# Patient Record
Sex: Female | Born: 1935 | Race: White | Hispanic: No | Smoking: Never smoker
Health system: Southern US, Community
[De-identification: ages and names within clinical notes are randomized; demographics above are authoritative.]

## PROBLEM LIST (undated history)

## (undated) DIAGNOSIS — I471 Supraventricular tachycardia, unspecified: Secondary | ICD-10-CM

## (undated) DIAGNOSIS — N183 Chronic kidney disease, stage 3 unspecified: Secondary | ICD-10-CM

## (undated) DIAGNOSIS — I10 Essential (primary) hypertension: Secondary | ICD-10-CM

## (undated) DIAGNOSIS — E079 Disorder of thyroid, unspecified: Secondary | ICD-10-CM

## (undated) DIAGNOSIS — I2510 Atherosclerotic heart disease of native coronary artery without angina pectoris: Secondary | ICD-10-CM

## (undated) DIAGNOSIS — D649 Anemia, unspecified: Secondary | ICD-10-CM

## (undated) DIAGNOSIS — K439 Ventral hernia without obstruction or gangrene: Secondary | ICD-10-CM

## (undated) DIAGNOSIS — E785 Hyperlipidemia, unspecified: Secondary | ICD-10-CM

## (undated) DIAGNOSIS — I214 Non-ST elevation (NSTEMI) myocardial infarction: Secondary | ICD-10-CM

## (undated) DIAGNOSIS — E039 Hypothyroidism, unspecified: Secondary | ICD-10-CM

## (undated) DIAGNOSIS — K5790 Diverticulosis of intestine, part unspecified, without perforation or abscess without bleeding: Secondary | ICD-10-CM

## (undated) DIAGNOSIS — C55 Malignant neoplasm of uterus, part unspecified: Secondary | ICD-10-CM

## (undated) HISTORY — DX: Chronic kidney disease, stage 3 (moderate): N18.3

## (undated) HISTORY — DX: Hypothyroidism, unspecified: E03.9

## (undated) HISTORY — DX: Non-ST elevation (NSTEMI) myocardial infarction: I21.4

## (undated) HISTORY — DX: Hyperlipidemia, unspecified: E78.5

## (undated) HISTORY — PX: OTHER SURGICAL HISTORY: SHX169

## (undated) HISTORY — DX: Diverticulosis of intestine, part unspecified, without perforation or abscess without bleeding: K57.90

## (undated) HISTORY — PX: ABDOMINAL HYSTERECTOMY: SHX81

## (undated) HISTORY — DX: Ventral hernia without obstruction or gangrene: K43.9

## (undated) HISTORY — PX: COLON SURGERY: SHX602

## (undated) HISTORY — DX: Essential (primary) hypertension: I10

## (undated) HISTORY — DX: Disorder of thyroid, unspecified: E07.9

## (undated) HISTORY — DX: Supraventricular tachycardia, unspecified: I47.10

## (undated) HISTORY — DX: Malignant neoplasm of uterus, part unspecified: C55

## (undated) HISTORY — DX: Atherosclerotic heart disease of native coronary artery without angina pectoris: I25.10

## (undated) HISTORY — DX: Anemia, unspecified: D64.9

## (undated) HISTORY — DX: Supraventricular tachycardia: I47.1

## (undated) HISTORY — DX: Chronic kidney disease, stage 3 unspecified: N18.30

---

## 1998-03-23 ENCOUNTER — Other Ambulatory Visit: Admission: RE | Admit: 1998-03-23 | Discharge: 1998-03-23 | Payer: Self-pay | Admitting: Obstetrics and Gynecology

## 1999-03-29 ENCOUNTER — Other Ambulatory Visit: Admission: RE | Admit: 1999-03-29 | Discharge: 1999-03-29 | Payer: Self-pay | Admitting: Obstetrics and Gynecology

## 1999-10-07 ENCOUNTER — Encounter: Payer: Self-pay | Admitting: Obstetrics and Gynecology

## 1999-10-07 ENCOUNTER — Encounter: Admission: RE | Admit: 1999-10-07 | Discharge: 1999-10-07 | Payer: Self-pay | Admitting: Obstetrics and Gynecology

## 2000-04-24 ENCOUNTER — Encounter: Admission: RE | Admit: 2000-04-24 | Discharge: 2000-04-24 | Payer: Self-pay | Admitting: Obstetrics and Gynecology

## 2000-04-24 ENCOUNTER — Encounter: Payer: Self-pay | Admitting: Obstetrics and Gynecology

## 2000-05-29 ENCOUNTER — Other Ambulatory Visit: Admission: RE | Admit: 2000-05-29 | Discharge: 2000-05-29 | Payer: Self-pay | Admitting: Obstetrics and Gynecology

## 2001-04-25 ENCOUNTER — Encounter: Payer: Self-pay | Admitting: Obstetrics and Gynecology

## 2001-04-25 ENCOUNTER — Encounter: Admission: RE | Admit: 2001-04-25 | Discharge: 2001-04-25 | Payer: Self-pay | Admitting: Obstetrics and Gynecology

## 2001-06-04 ENCOUNTER — Other Ambulatory Visit: Admission: RE | Admit: 2001-06-04 | Discharge: 2001-06-04 | Payer: Self-pay | Admitting: Obstetrics and Gynecology

## 2002-02-18 ENCOUNTER — Ambulatory Visit (HOSPITAL_COMMUNITY): Admission: RE | Admit: 2002-02-18 | Discharge: 2002-02-19 | Payer: Self-pay | Admitting: Cardiology

## 2007-07-03 ENCOUNTER — Encounter: Admission: RE | Admit: 2007-07-03 | Discharge: 2007-07-03 | Payer: Self-pay | Admitting: Endocrinology

## 2009-08-26 ENCOUNTER — Inpatient Hospital Stay (HOSPITAL_COMMUNITY): Admission: AD | Admit: 2009-08-26 | Discharge: 2009-08-27 | Payer: Self-pay | Admitting: Obstetrics and Gynecology

## 2009-08-26 ENCOUNTER — Ambulatory Visit: Payer: Self-pay | Admitting: Physician Assistant

## 2009-08-26 IMAGING — US US TRANSVAGINAL NON-OB
1 series · 14 of 25 positions shown · non-contrast
Comparison: None.

[DATE] - DUPLICATE COPY for exam association in [facility] – No change from original report.
CLINICAL DATA: Postmenopausal bleeding today. Lower back pain.

 TRANSABDOMINAL AND TRANSVAGINAL ULTRASOUND OF PELVIS
TECHNIQUE: Both transabdominal and transvaginal ultrasound
 examinations of the pelvis were performed including evaluation of
 the uterus, ovaries, adnexal regions, and pelvic cul-de-sac.

[Series 1: us pelvis complete modify · 0.24mm/px · 14 of 46 slices shown]
[im 1/46]
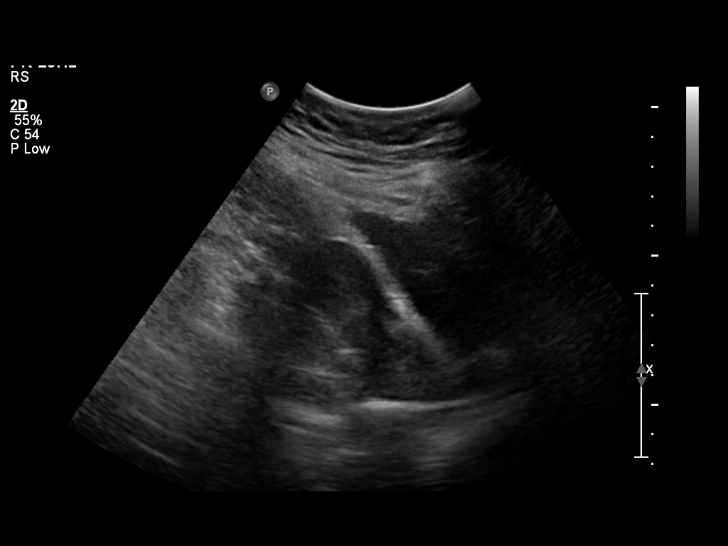
[im 4/46]
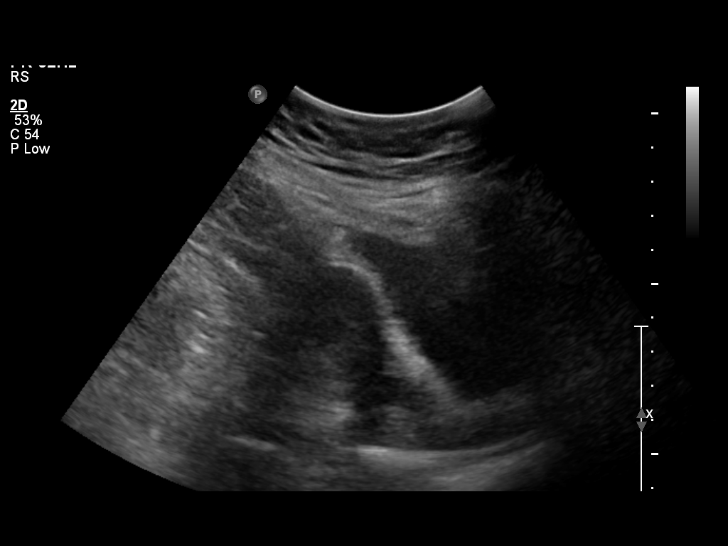
[im 8/46]
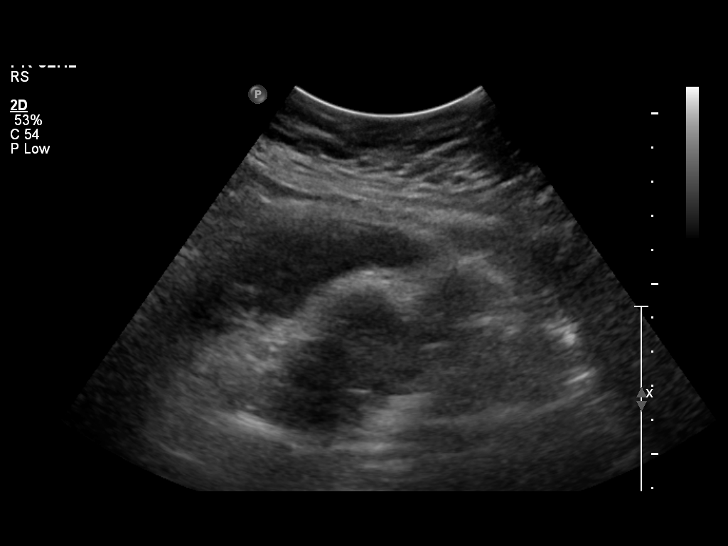
[im 12/46]
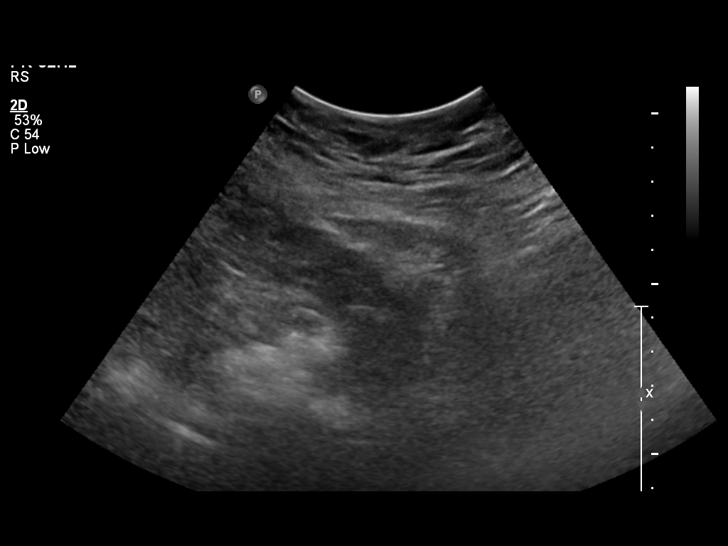
[im 16/46]
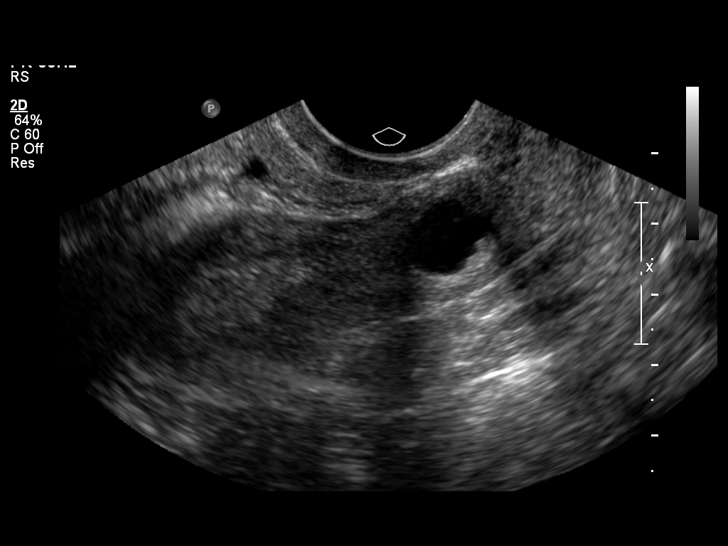
[im 17/46]
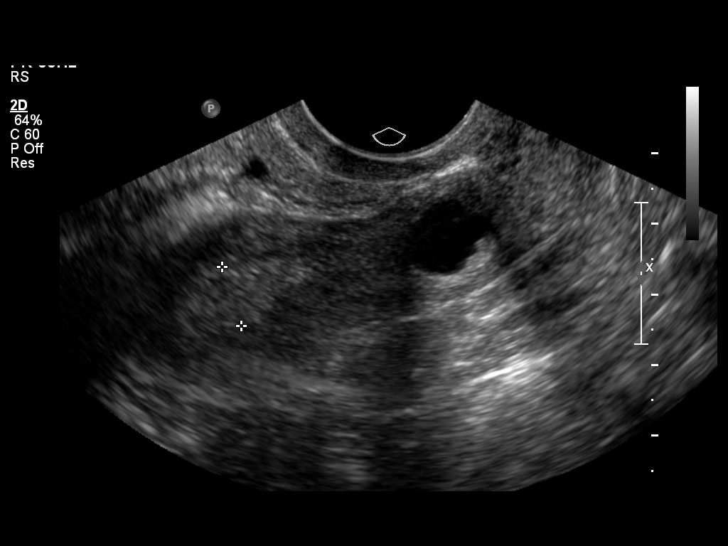
[im 21/46]
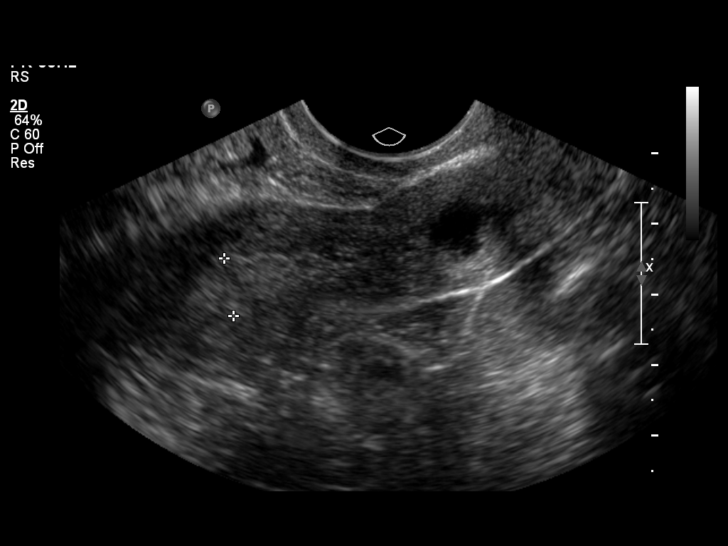
[im 25/46]
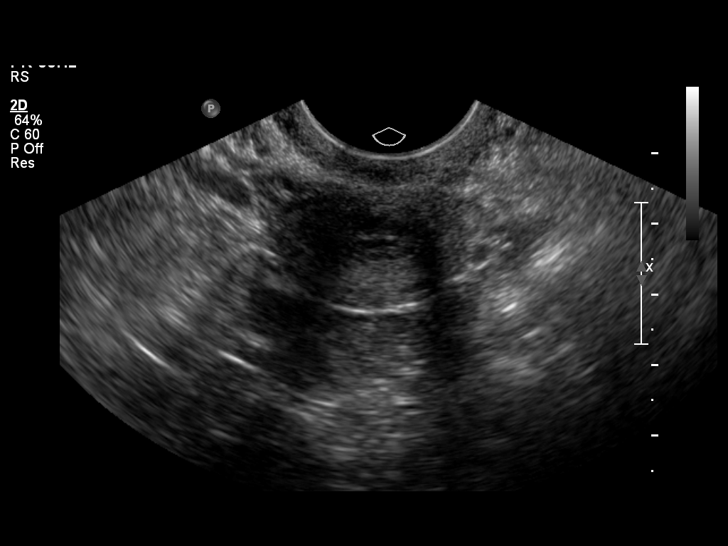
[im 29/46]
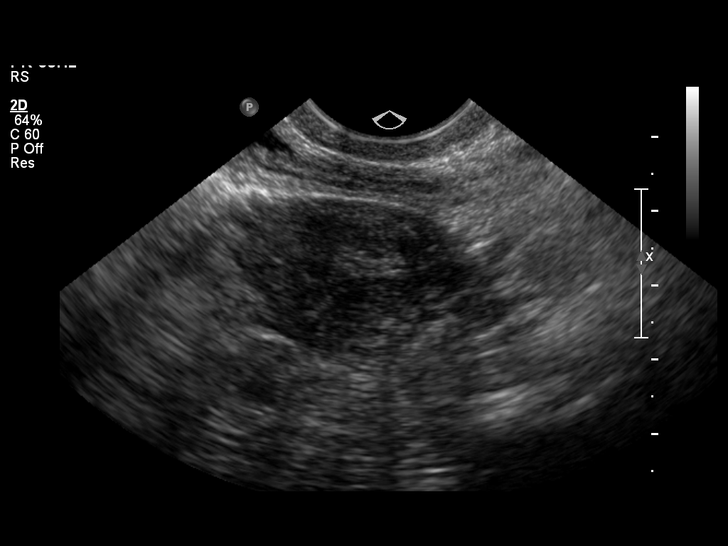
[im 31/46]
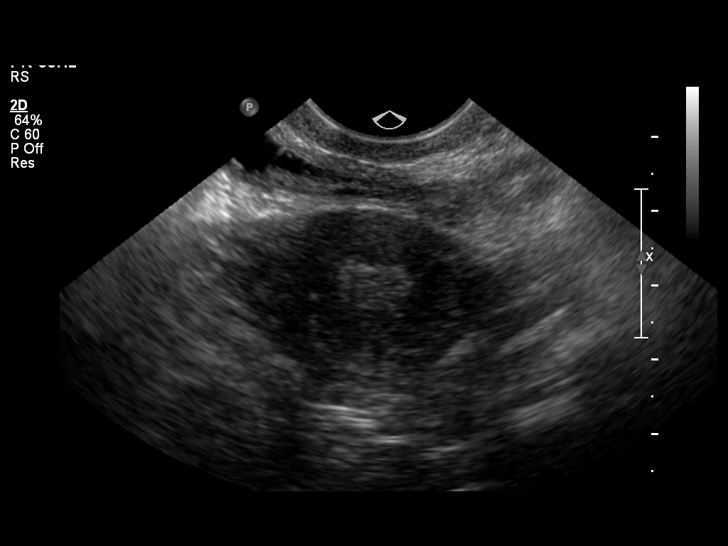
[im 34/46]
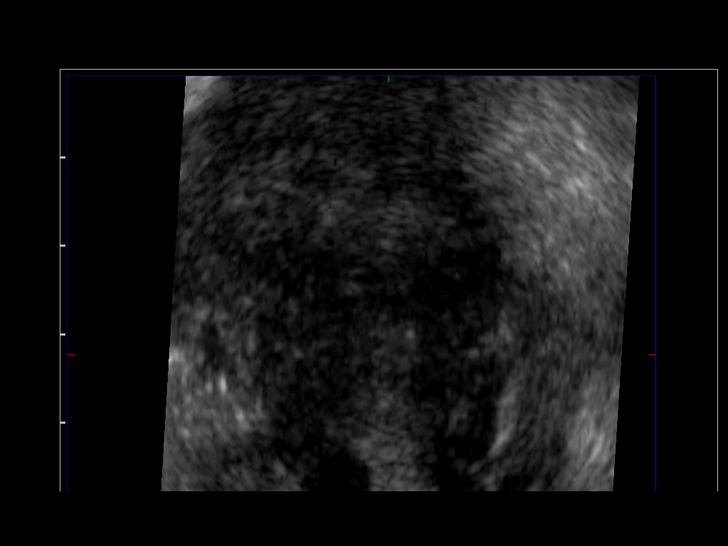
[im 38/46]
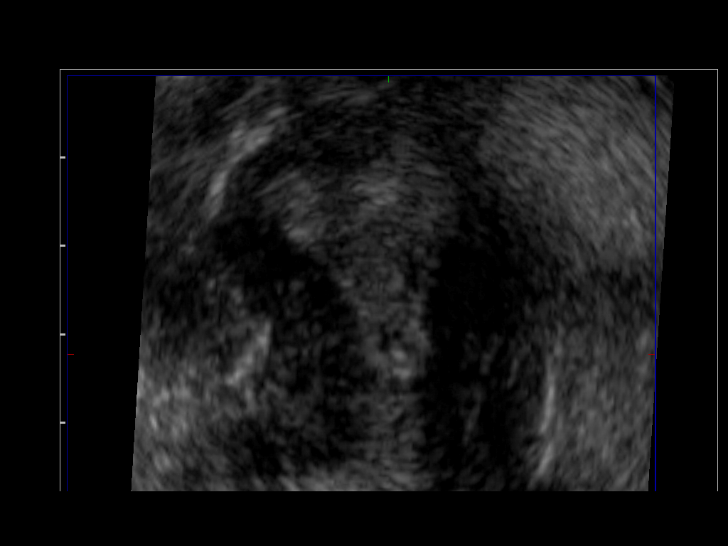
[im 42/46]
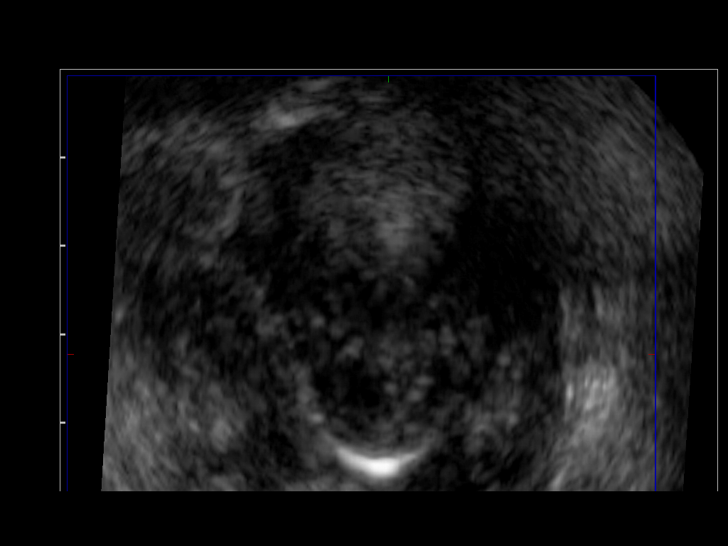
[im 46/46]
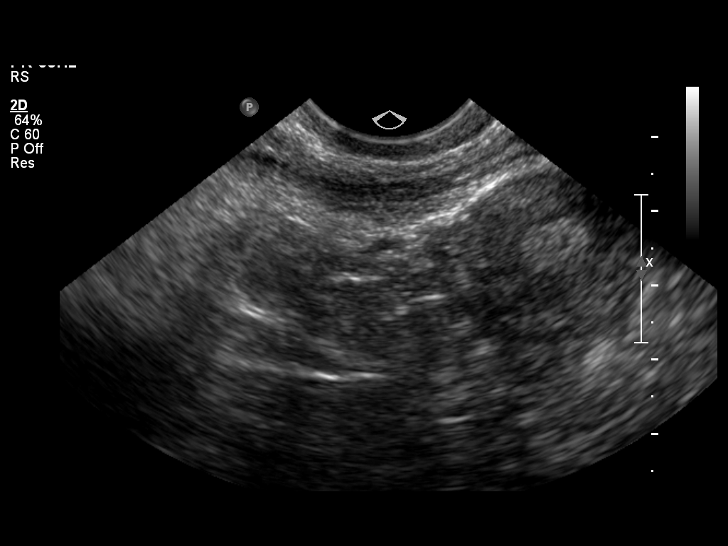

[14 of 25 positions shown; findings below may reference images not displayed]

FINDINGS: Uterus measures 6.2 x 2.3 x 3.1 cm.

 Endometrium measures 8.8 mm

 Right Ovary is not visualized.

 Left Ovary is not visualized.

 Other Findings: No free pelvic fluid.
IMPRESSION: Endometrium is thickened in the setting of postmenopausal bleeding.
 Differential diagnosis includes endometrial hyperplasia, polyp.
 Endometrial carcinoma is not excluded. Endometrial biopsy should
 be considered.

## 2009-09-09 ENCOUNTER — Ambulatory Visit (HOSPITAL_COMMUNITY): Admission: RE | Admit: 2009-09-09 | Discharge: 2009-09-09 | Payer: Self-pay | Admitting: Obstetrics and Gynecology

## 2009-10-07 ENCOUNTER — Ambulatory Visit: Admission: RE | Admit: 2009-10-07 | Discharge: 2009-10-07 | Payer: Self-pay | Admitting: Gynecology

## 2009-10-20 IMAGING — CR DG CHEST 2V
2 series · 2 of 2 positions shown · non-contrast
Comparison: None.

CLINICAL DATA: Preoperative film.

CHEST - 2 VIEW

[view not recorded (1 of 2)]
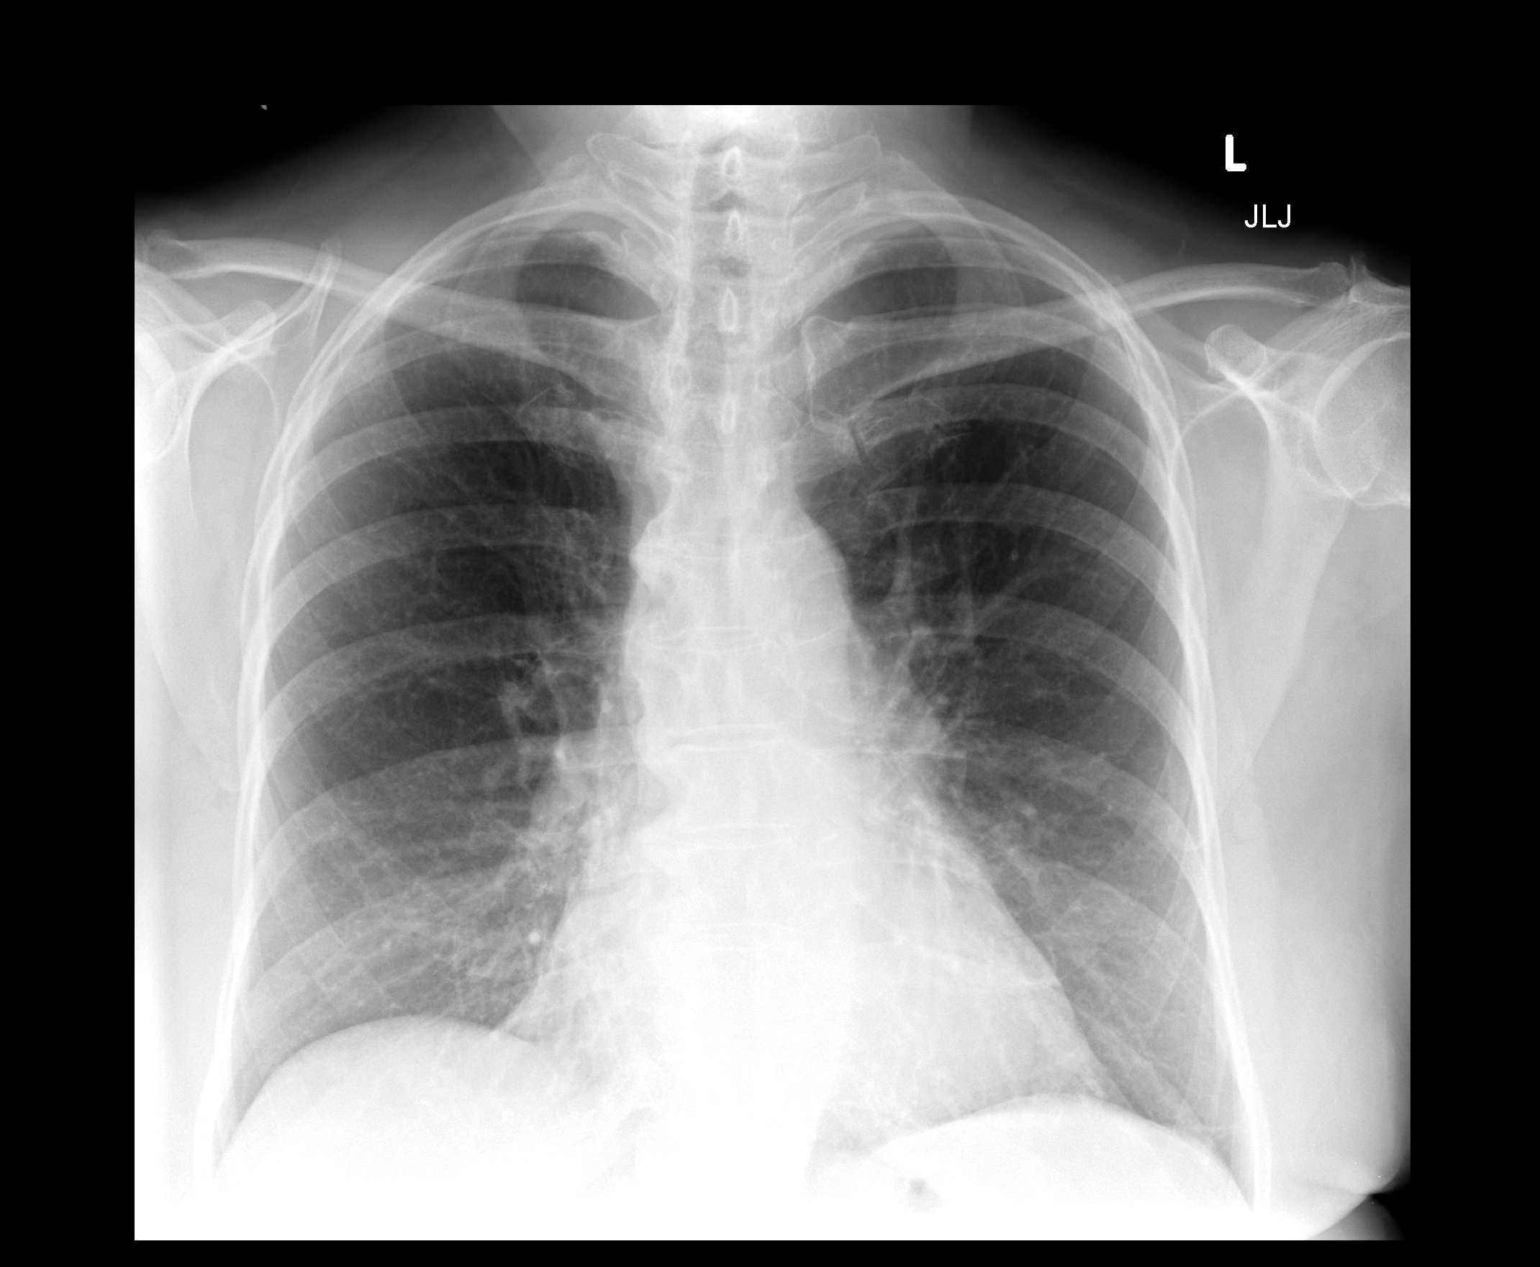

[view not recorded (2 of 2)]
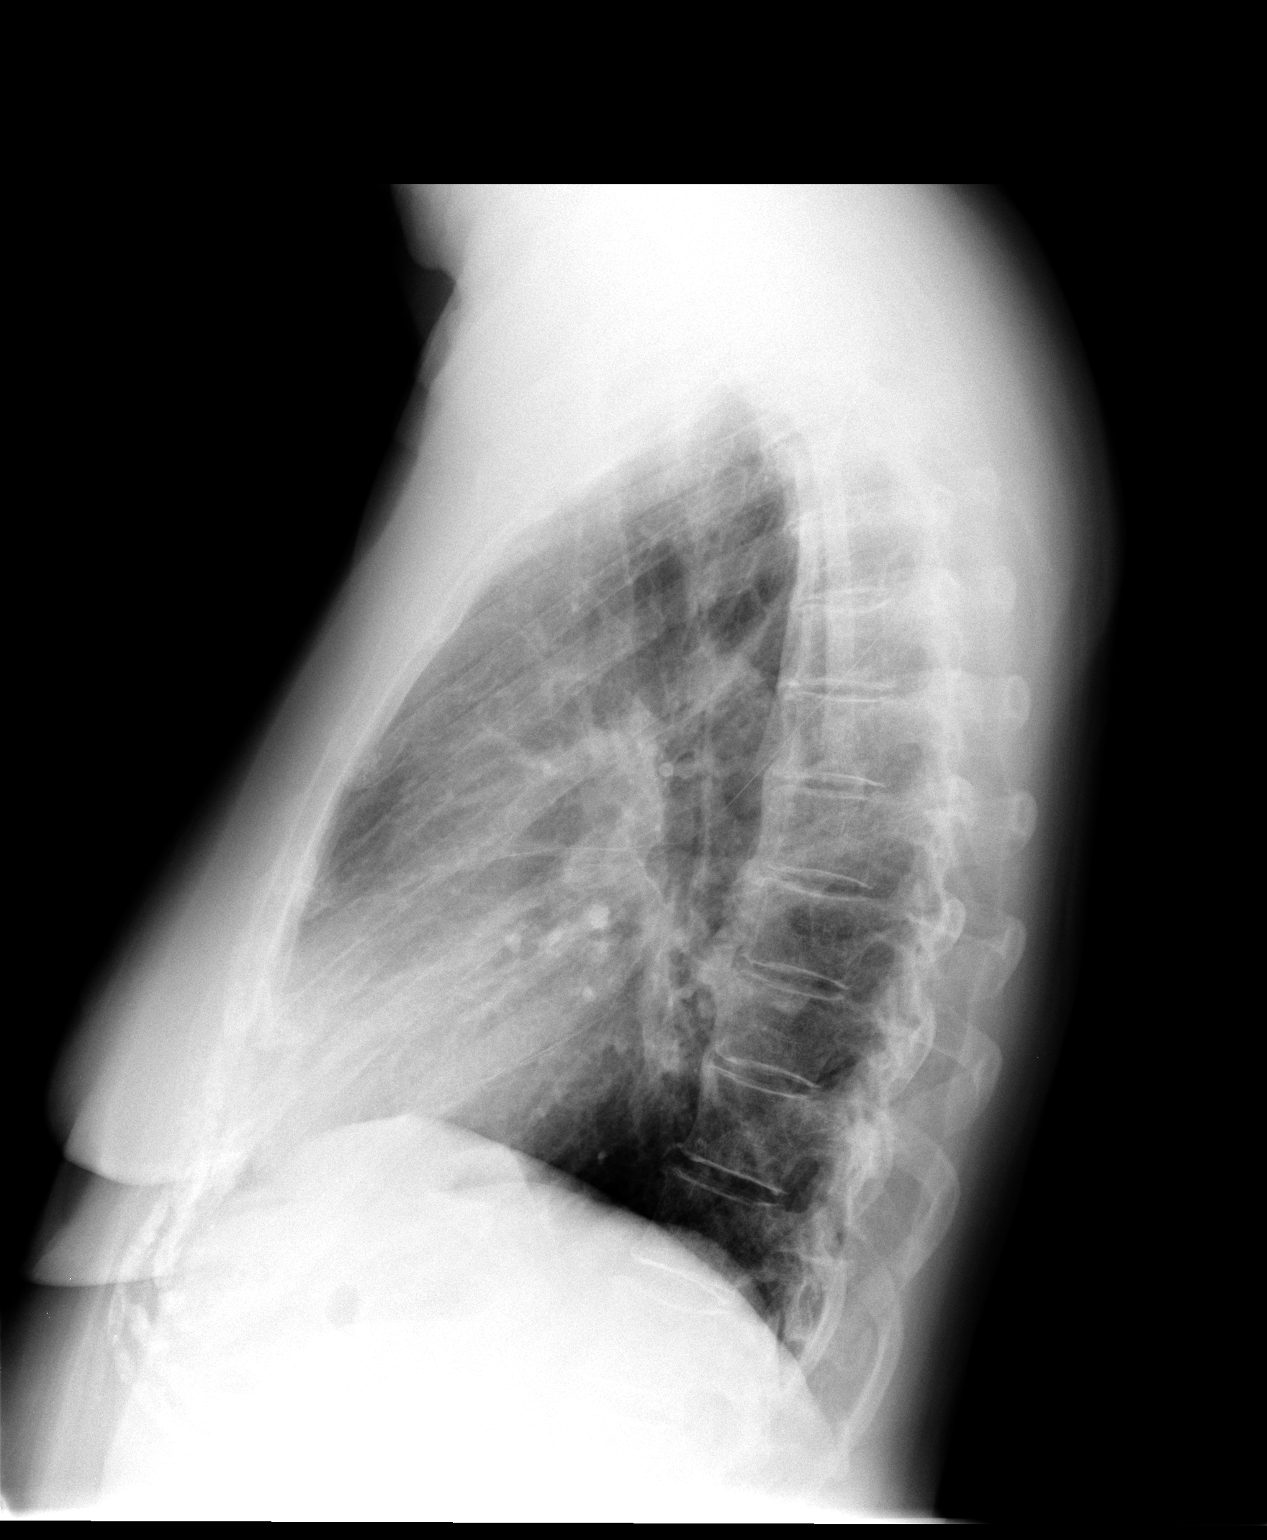

[2 of 2 positions shown; findings below may reference images not displayed]

FINDINGS: Lungs are clear.  No pleural effusion.  Heart size upper
normal.  No focal bony abnormality.
IMPRESSION: No acute disease.

## 2009-10-27 ENCOUNTER — Encounter: Payer: Self-pay | Admitting: Gynecology

## 2009-10-27 ENCOUNTER — Inpatient Hospital Stay (HOSPITAL_COMMUNITY): Admission: RE | Admit: 2009-10-27 | Discharge: 2009-11-01 | Payer: Self-pay | Admitting: Obstetrics and Gynecology

## 2009-11-04 IMAGING — CT CT HEAD W/O CM
1 of 2 series · 13 of 30 positions shown, 17 images · non-contrast
Comparison: None.

CLINICAL DATA: Code stroke.  Left-sided weakness.

CT HEAD WITHOUT CONTRAST
TECHNIQUE: Contiguous axial images were obtained from the base of
the skull through the vertex without contrast.

[Series 2: brain · axial · 0.47mm/px · z∈[+147,+288]mm · 13 of 32 slices shown, 17 images]
[im 3/32  brain]
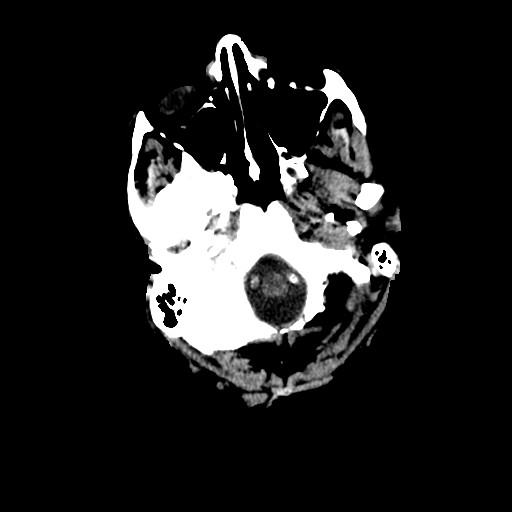
[im 3/32  bone]
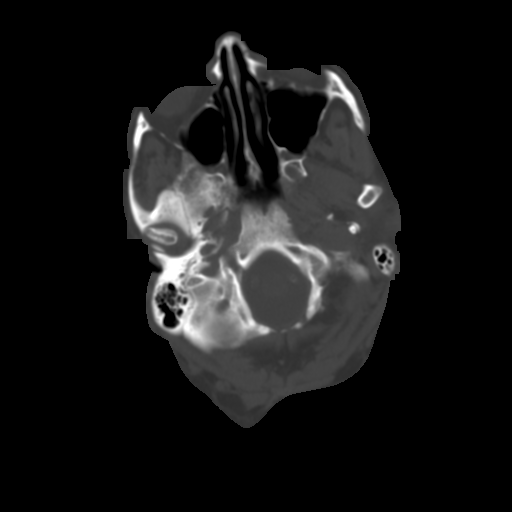
[im 5/32  brain]
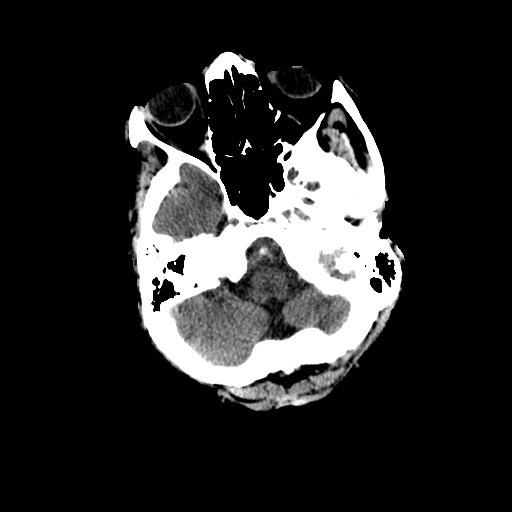
[im 7/32  brain]
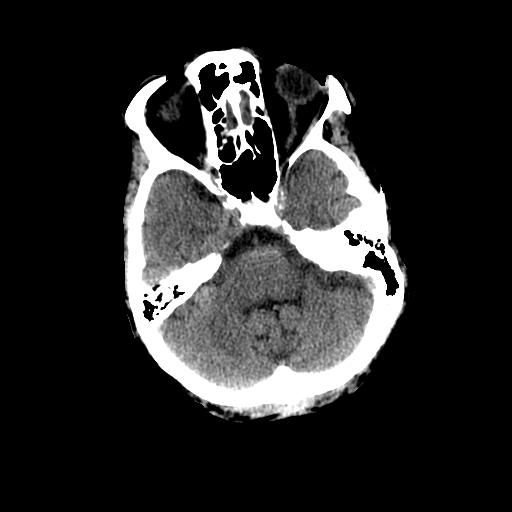
[im 9/32  brain]
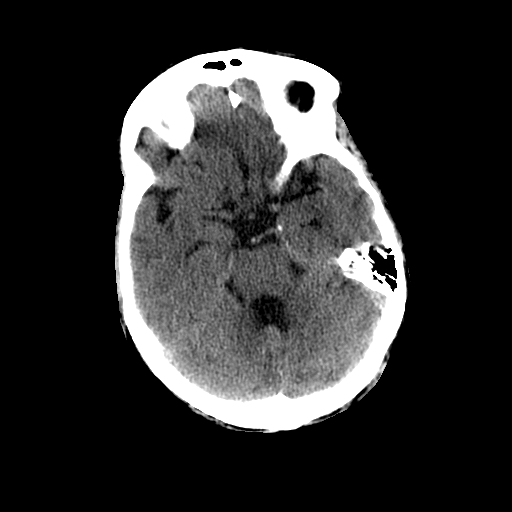
[im 12/32  brain]
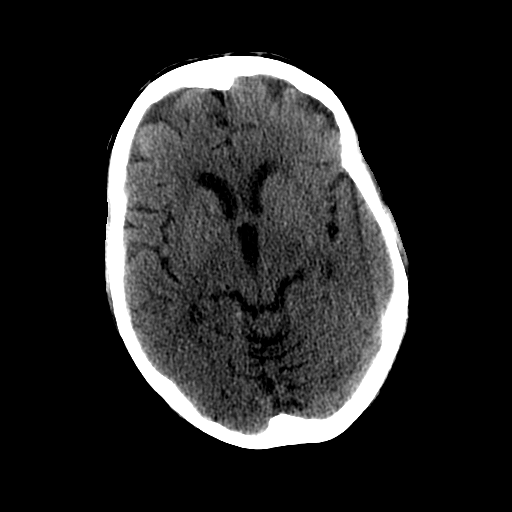
[im 12/32  bone]
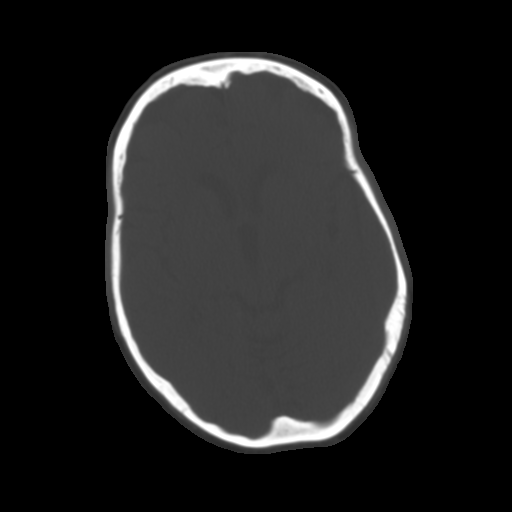
[im 14/32  brain]
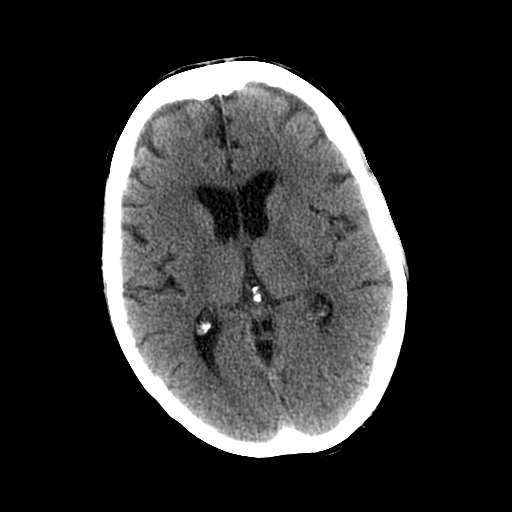
[im 16/32  brain]
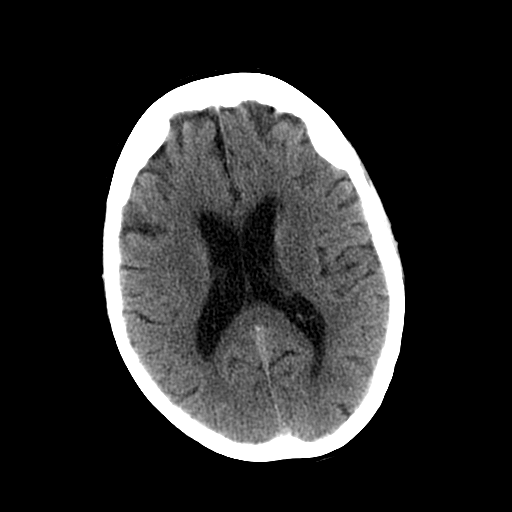
[im 18/32  brain]
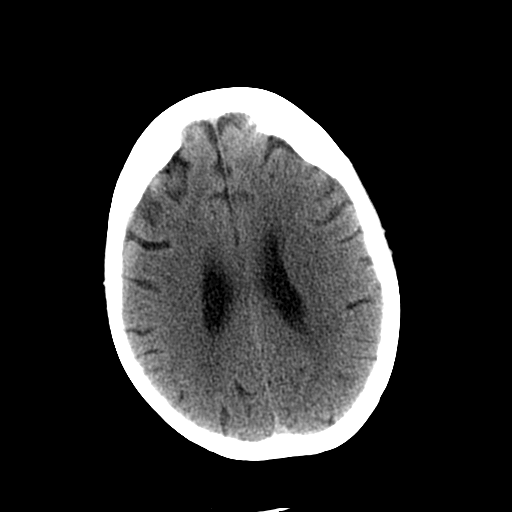
[im 20/32  brain]
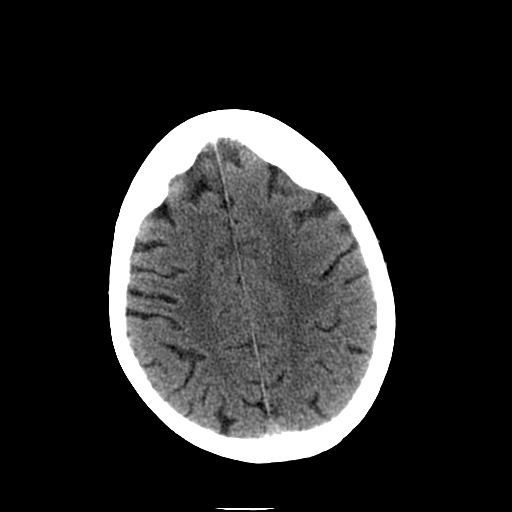
[im 20/32  bone]
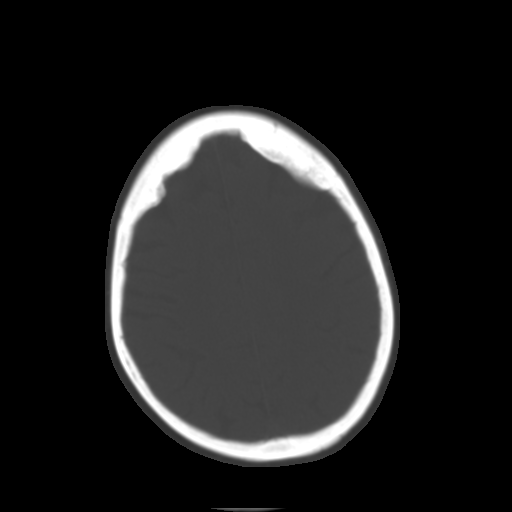
[im 23/32  brain]
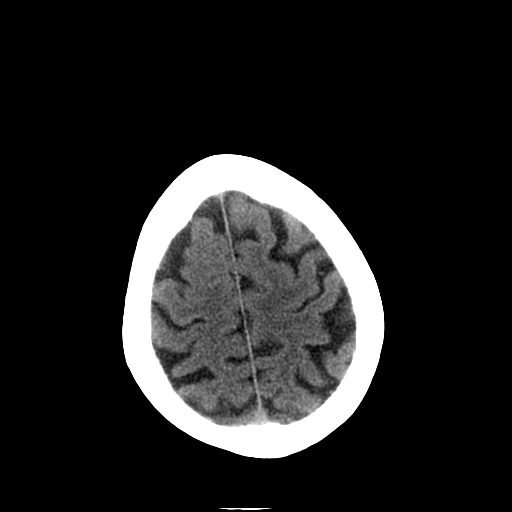
[im 25/32  brain]
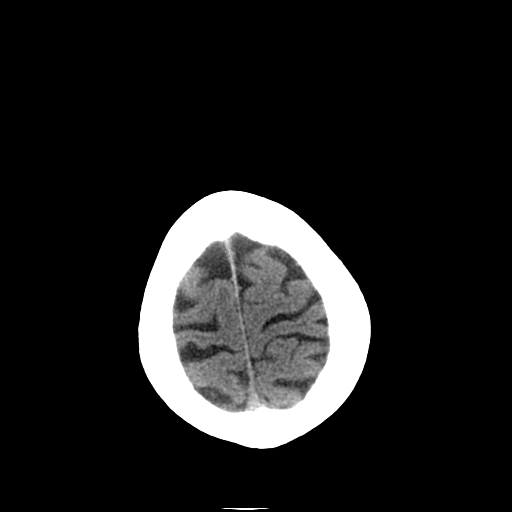
[im 27/32  brain]
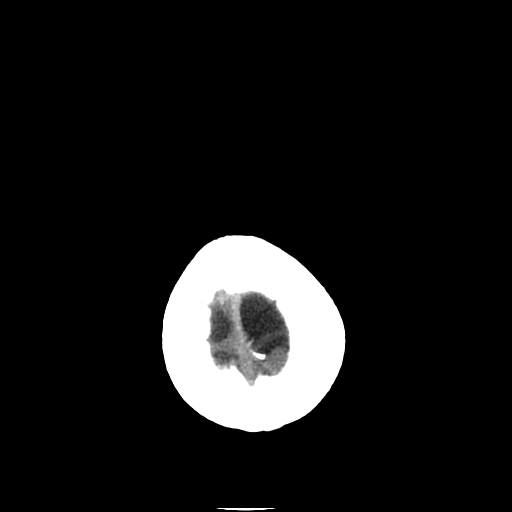
[im 29/32  brain]
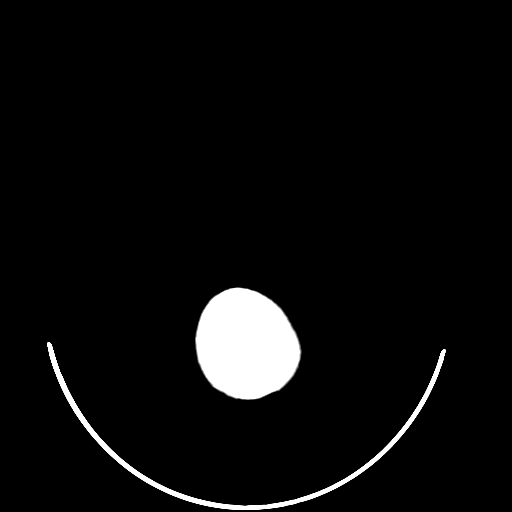
[im 29/32  bone]
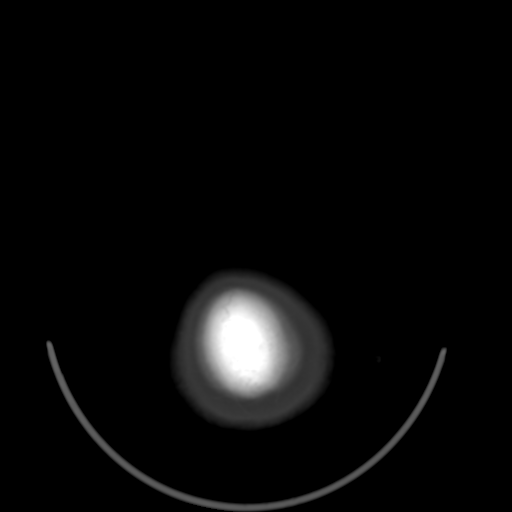

[13 of 30 positions shown; findings below may reference images not displayed]

FINDINGS: Mild generalized atrophy is present.  No acute cortical
infarct, hemorrhage, mass, hydrocephalus, or significant extra-
axial fluid collection is present.  The paranasal sinuses and
mastoid air cells are clear.  The osseous skull is intact.
IMPRESSION: 1.  Mild generalized atrophy is likely within normal limits for
age.
2.  Otherwise unremarkable CT of the head.

Critical test results telephoned to Dr. MARLIT at the time of
interpretation on [DATE] at 11 o'clock p.m.

## 2009-11-05 ENCOUNTER — Encounter (INDEPENDENT_AMBULATORY_CARE_PROVIDER_SITE_OTHER): Payer: Self-pay | Admitting: Internal Medicine

## 2009-11-05 ENCOUNTER — Encounter (INDEPENDENT_AMBULATORY_CARE_PROVIDER_SITE_OTHER): Payer: Self-pay | Admitting: Pulmonary Disease

## 2009-11-05 ENCOUNTER — Ambulatory Visit: Payer: Self-pay | Admitting: Cardiovascular Disease

## 2009-11-05 ENCOUNTER — Ambulatory Visit: Payer: Self-pay | Admitting: Internal Medicine

## 2009-11-05 ENCOUNTER — Ambulatory Visit: Payer: Self-pay | Admitting: Vascular Surgery

## 2009-11-05 ENCOUNTER — Inpatient Hospital Stay (HOSPITAL_COMMUNITY): Admission: EM | Admit: 2009-11-05 | Discharge: 2009-11-20 | Payer: Self-pay | Admitting: Emergency Medicine

## 2009-11-05 IMAGING — CR DG CHEST 1V PORT
1 series · 1 of 1 positions shown · non-contrast
Comparison: [DATE].

CLINICAL DATA: 81-year-old female Code stroke.  Central line
placement.

PORTABLE CHEST - 1 VIEW

[AP]
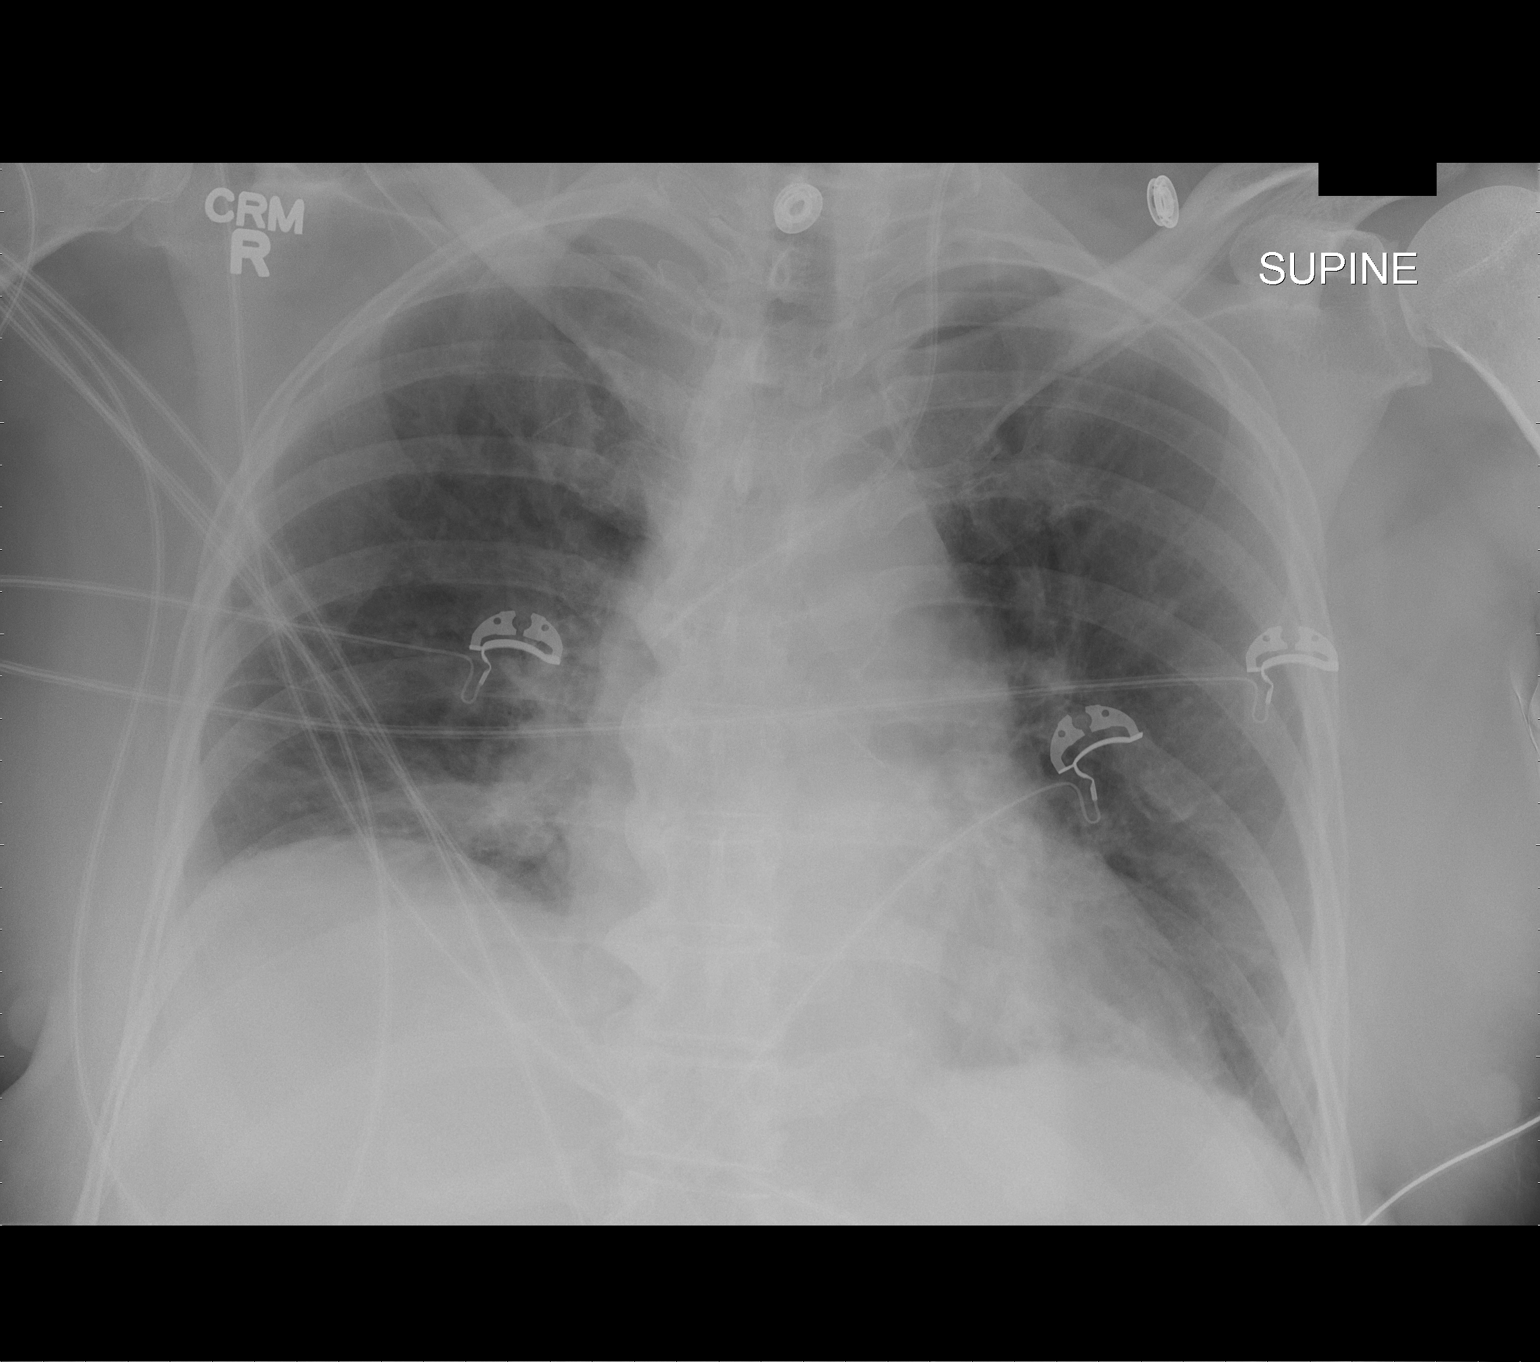

[1 of 1 positions shown; findings below may reference images not displayed]

FINDINGS: Portable spine AP view [ZM] hours.  Left IJ approach
central venous catheter, tip at the SVC level.  Low lung volumes.
Bibasilar atelectasis.  No pneumothorax.  Stable cardiac size and
mediastinal contours.  Visualized tracheal air column is within
normal limits.
IMPRESSION: 1. Left IJ approach venous catheter, tip at the level of the SVC.
2.  Low lung volumes with atelectasis.  No pneumothorax.

## 2009-11-05 IMAGING — CR DG CHEST 1V PORT
1 series · 1 of 1 positions shown · non-contrast
Comparison: [NK] hours the same day.

CLINICAL DATA: 81-year-old female endotracheal tube placement,
sepsis.

PORTABLE CHEST - 1 VIEW

[AP]
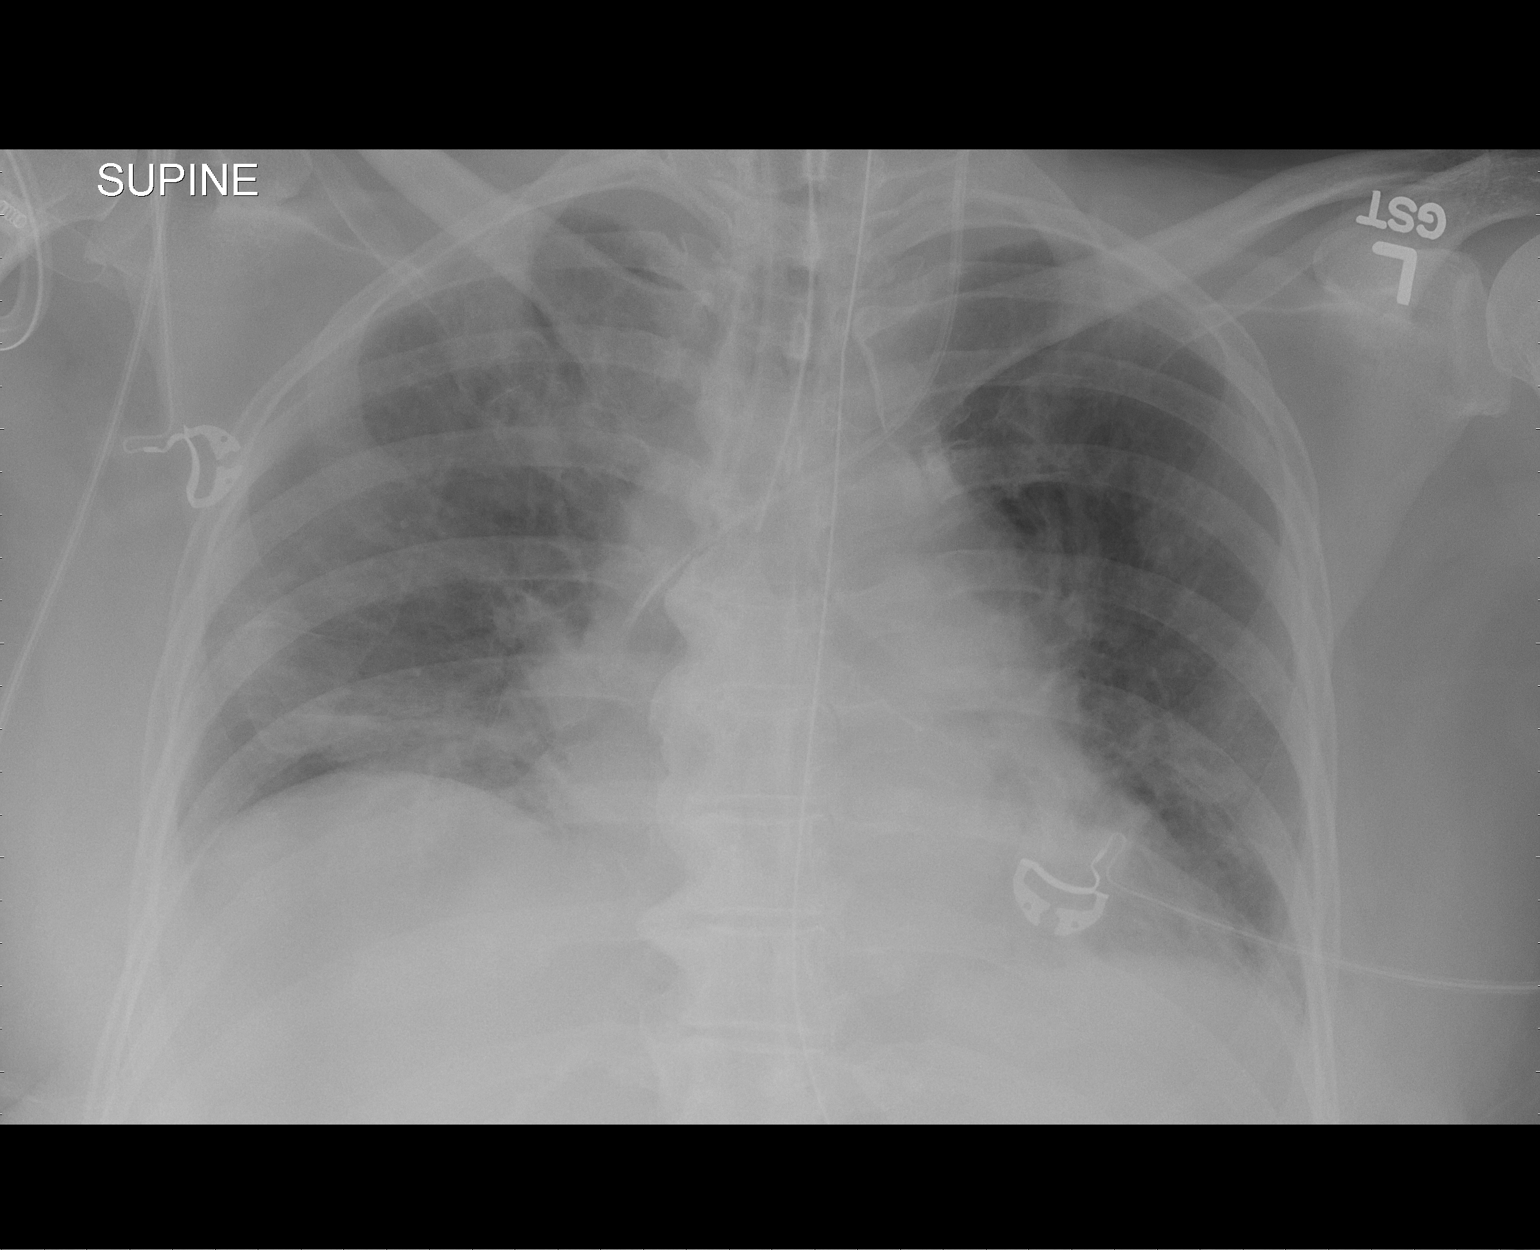

[1 of 1 positions shown; findings below may reference images not displayed]

FINDINGS: AP portable supine view at [NK] hours.  Stable left IJ
catheter.  Enteric tube courses to the abdomen, tip not included.
Endotracheal tube placed, tip in close proximity to the carina.
The lower lung volumes.  Increased bibasilar and perihilar opacity.
No pneumothorax, pulmonary edema or large effusion.
IMPRESSION: 1.  Endotracheal tube tip in close proximity to the carina.
Recommend retraction of 2-3 cm.
2.  Lower lung volumes and increased atelectasis or consolidation.
3.  Enteric tube courses to the abdomen.

## 2009-11-06 IMAGING — CR DG CHEST 1V PORT
1 series · 1 of 1 positions shown · non-contrast
Comparison: [DATE]

CLINICAL DATA: Septic shock.  Renal failure. Ventilator dependent
respiratory failure.

PORTABLE CHEST - 1 VIEW

[AP]
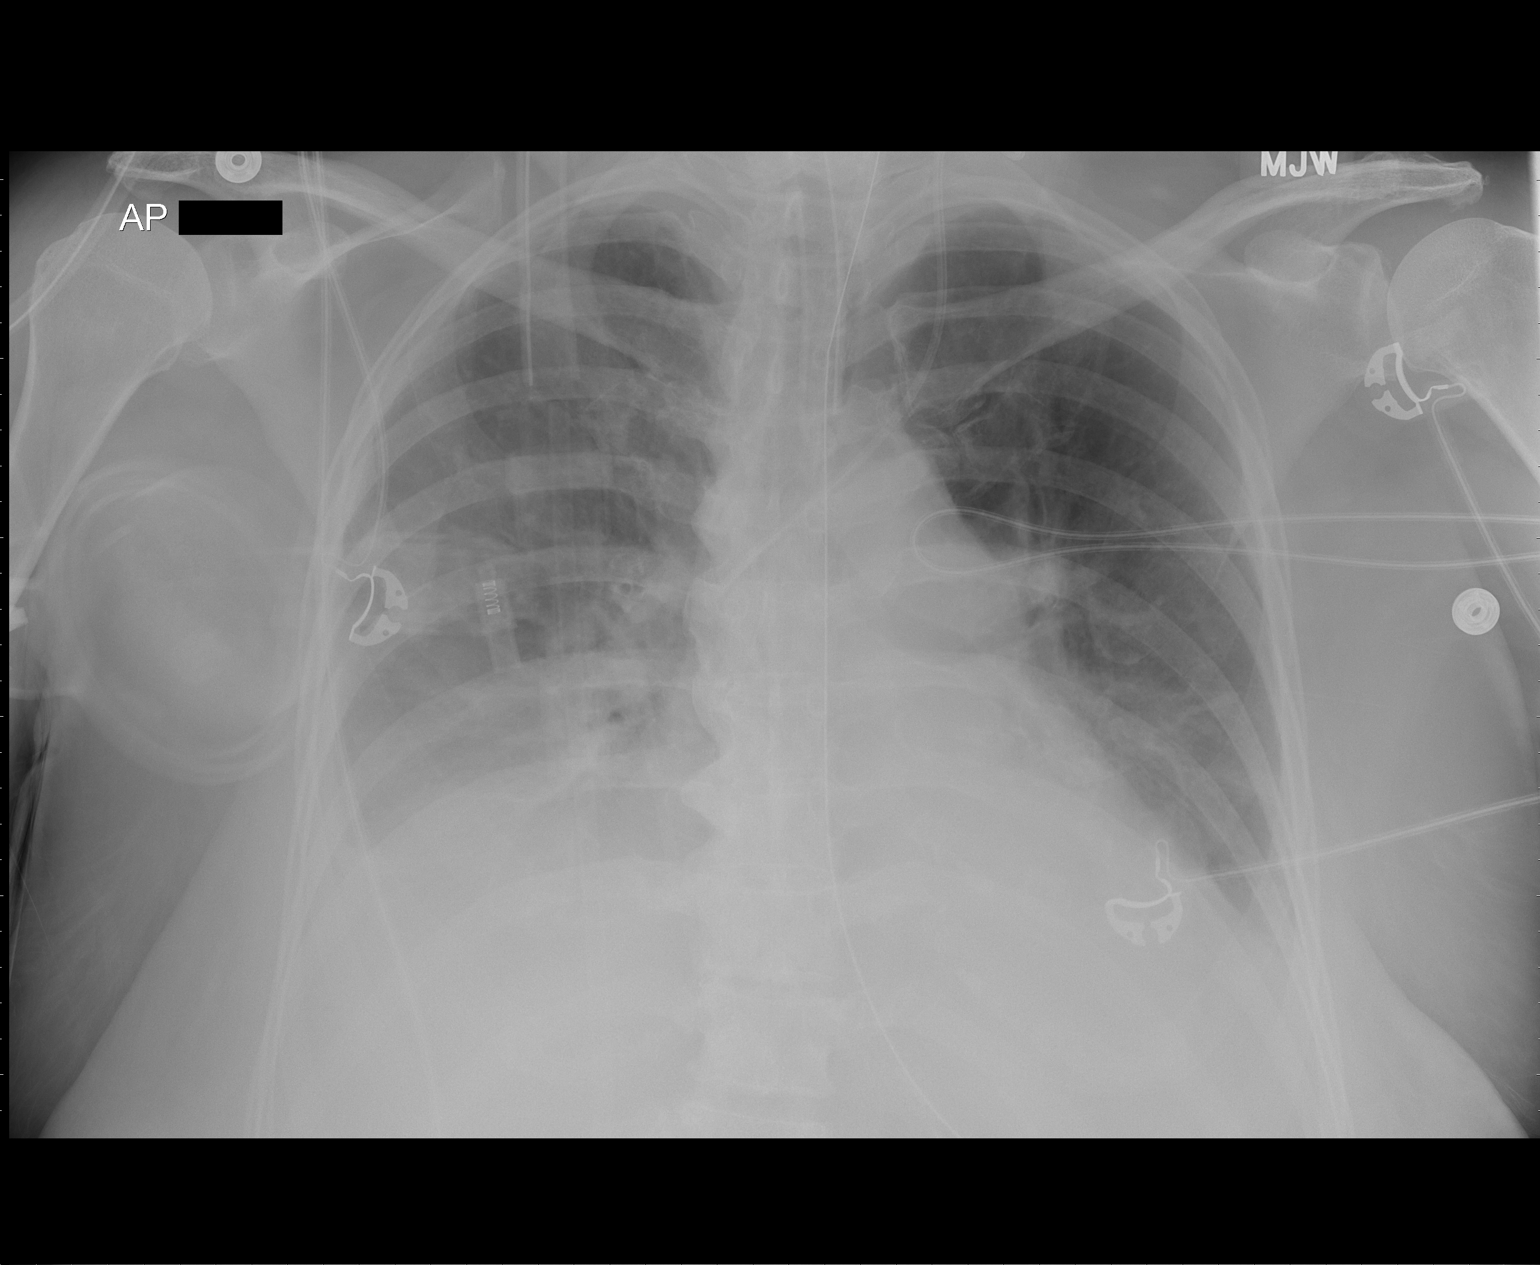

[1 of 1 positions shown; findings below may reference images not displayed]

FINDINGS: Support apparatus remains in stable position.  Increased
atelectasis or consolidation is seen in both lung bases.  Pleural
effusions cannot be excluded.  Cardiomegaly stable.
IMPRESSION: Increased bibasilar atelectasis or consolidation.  Pleural
effusions cannot be excluded.

## 2009-11-07 IMAGING — CR DG CHEST 1V PORT
1 series · 1 of 1 positions shown · non-contrast
Comparison: [DATE]

CLINICAL DATA: Septic shock

PORTABLE CHEST - 1 VIEW

[AP]
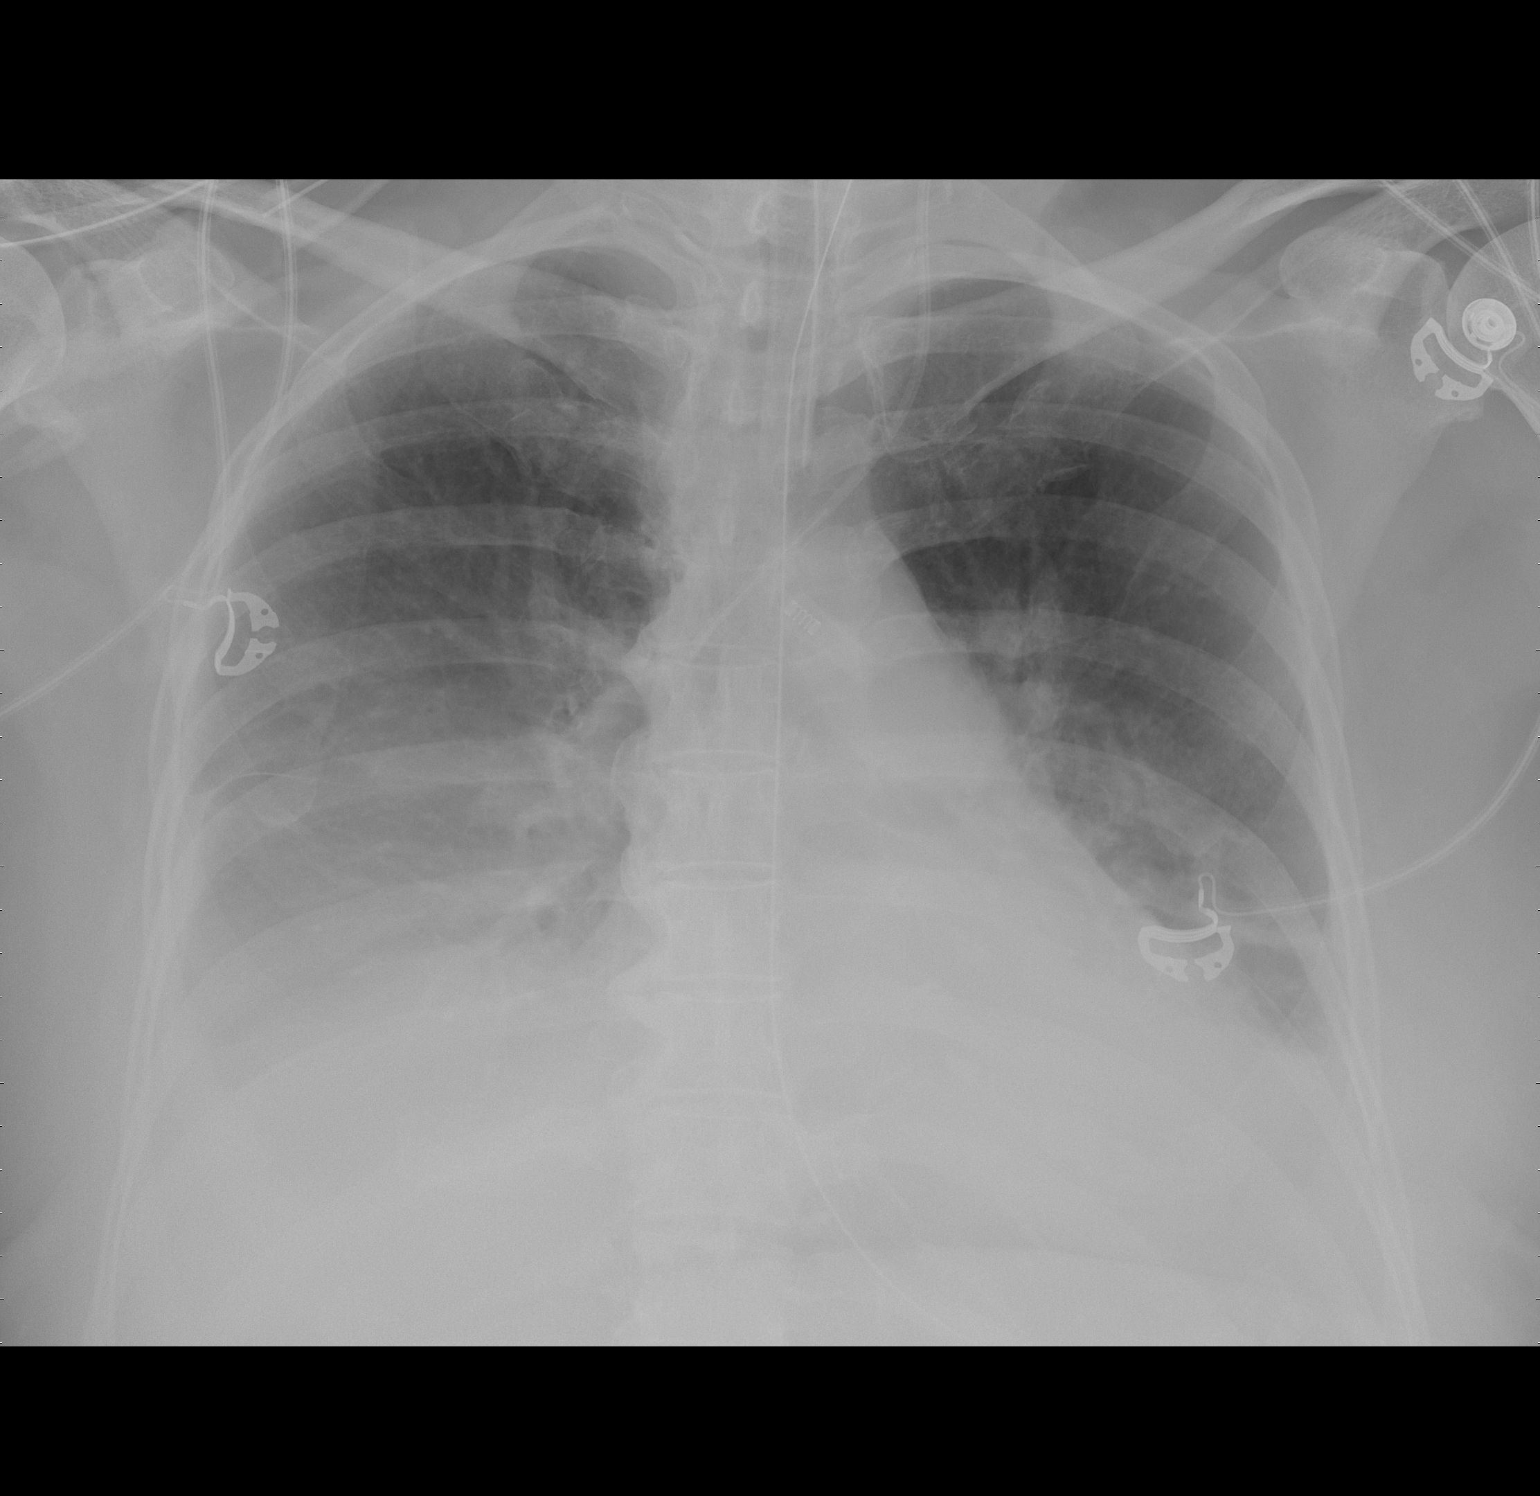

[1 of 1 positions shown; findings below may reference images not displayed]

FINDINGS: Bilateral hazy opacity and bilateral effusions are
slightly worse.  Endotracheal tube, NG tube, central venous
catheter are stable.  No pneumothorax.
IMPRESSION: Worsening bilateral effusions and hazy airspace disease.

## 2009-11-08 IMAGING — CR DG CHEST 1V PORT
1 series · 1 of 1 positions shown · non-contrast
Comparison: Yesterday

CLINICAL DATA: Septic shock

PORTABLE CHEST - 1 VIEW

[AP]
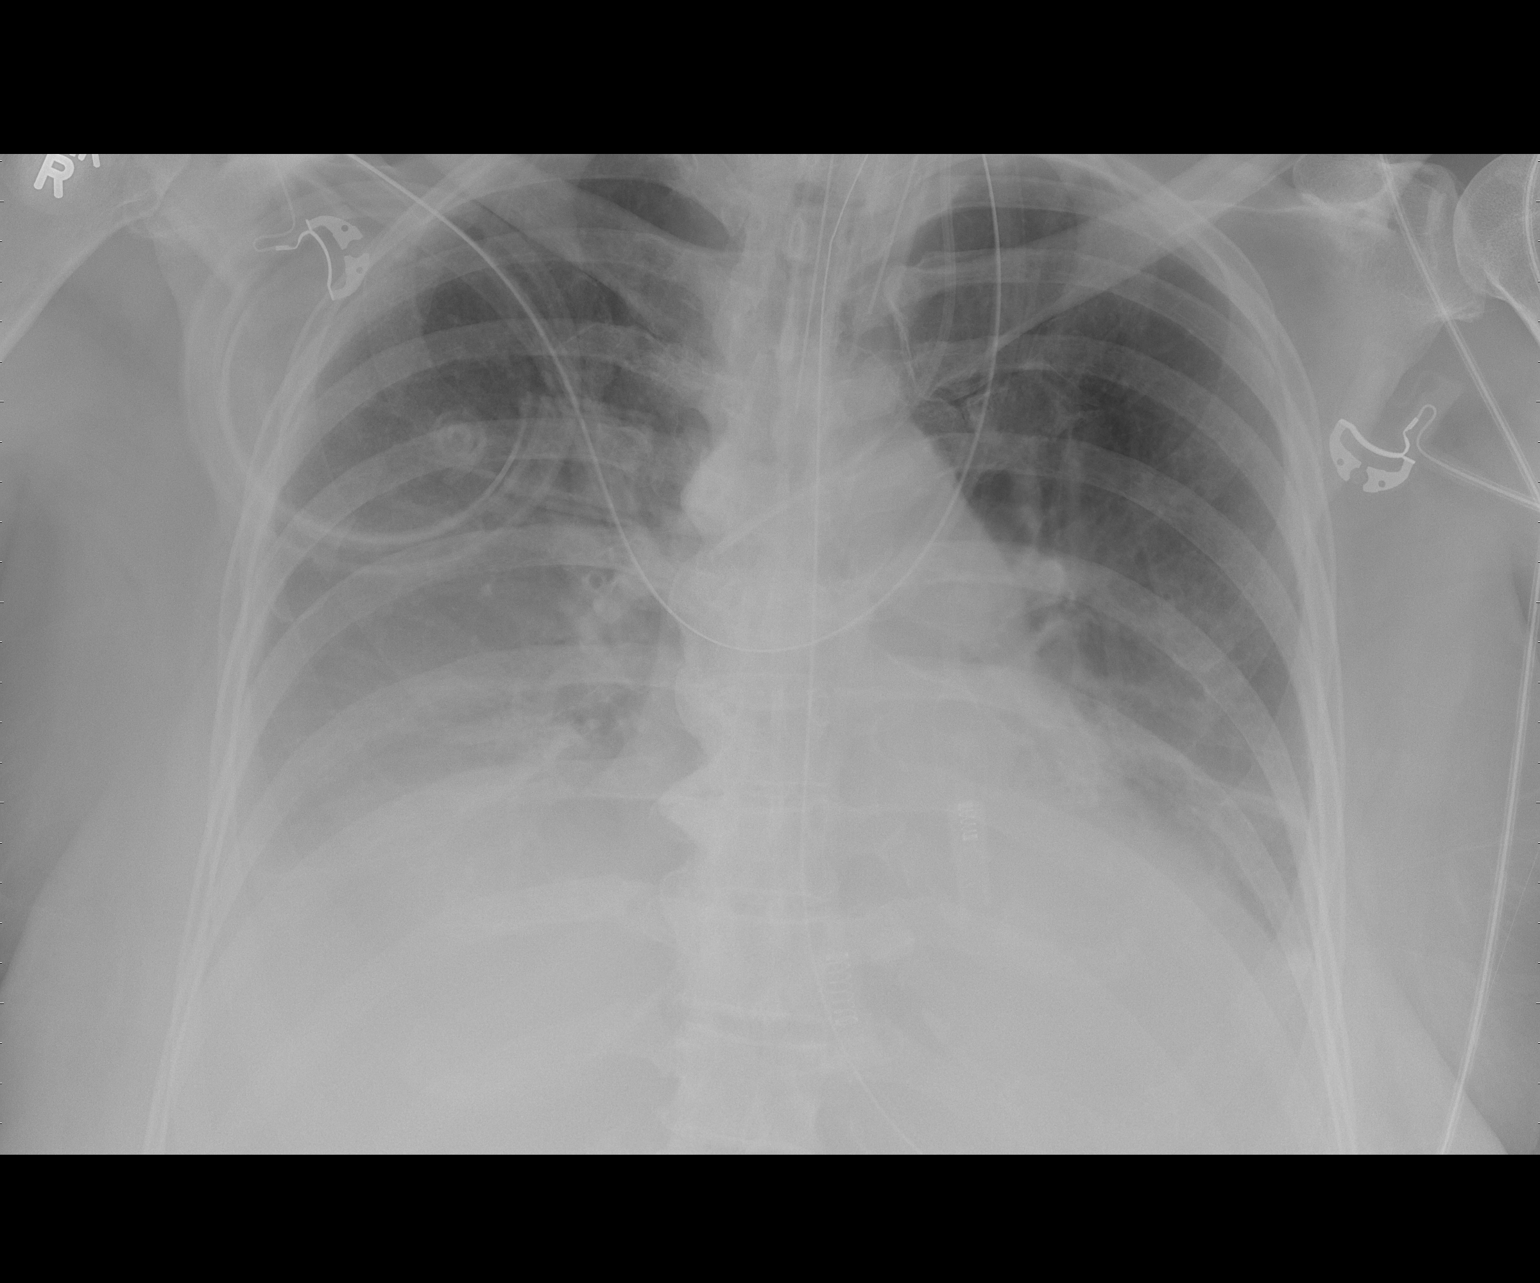

[1 of 1 positions shown; findings below may reference images not displayed]

FINDINGS: Endotracheal tube, NG tube, central venous catheter are
stable.  Bibasilar haziness likely a combination of pleural fluid
and airspace disease is unchanged.  No pneumothorax.
IMPRESSION: Able basilar airspace disease and effusions.

## 2009-11-09 IMAGING — CR DG CHEST 1V PORT
1 series · 1 of 1 positions shown · non-contrast
Comparison: [DATE]

CLINICAL DATA: Septic shock.  Renal failure.

PORTABLE CHEST - 1 VIEW

[AP]
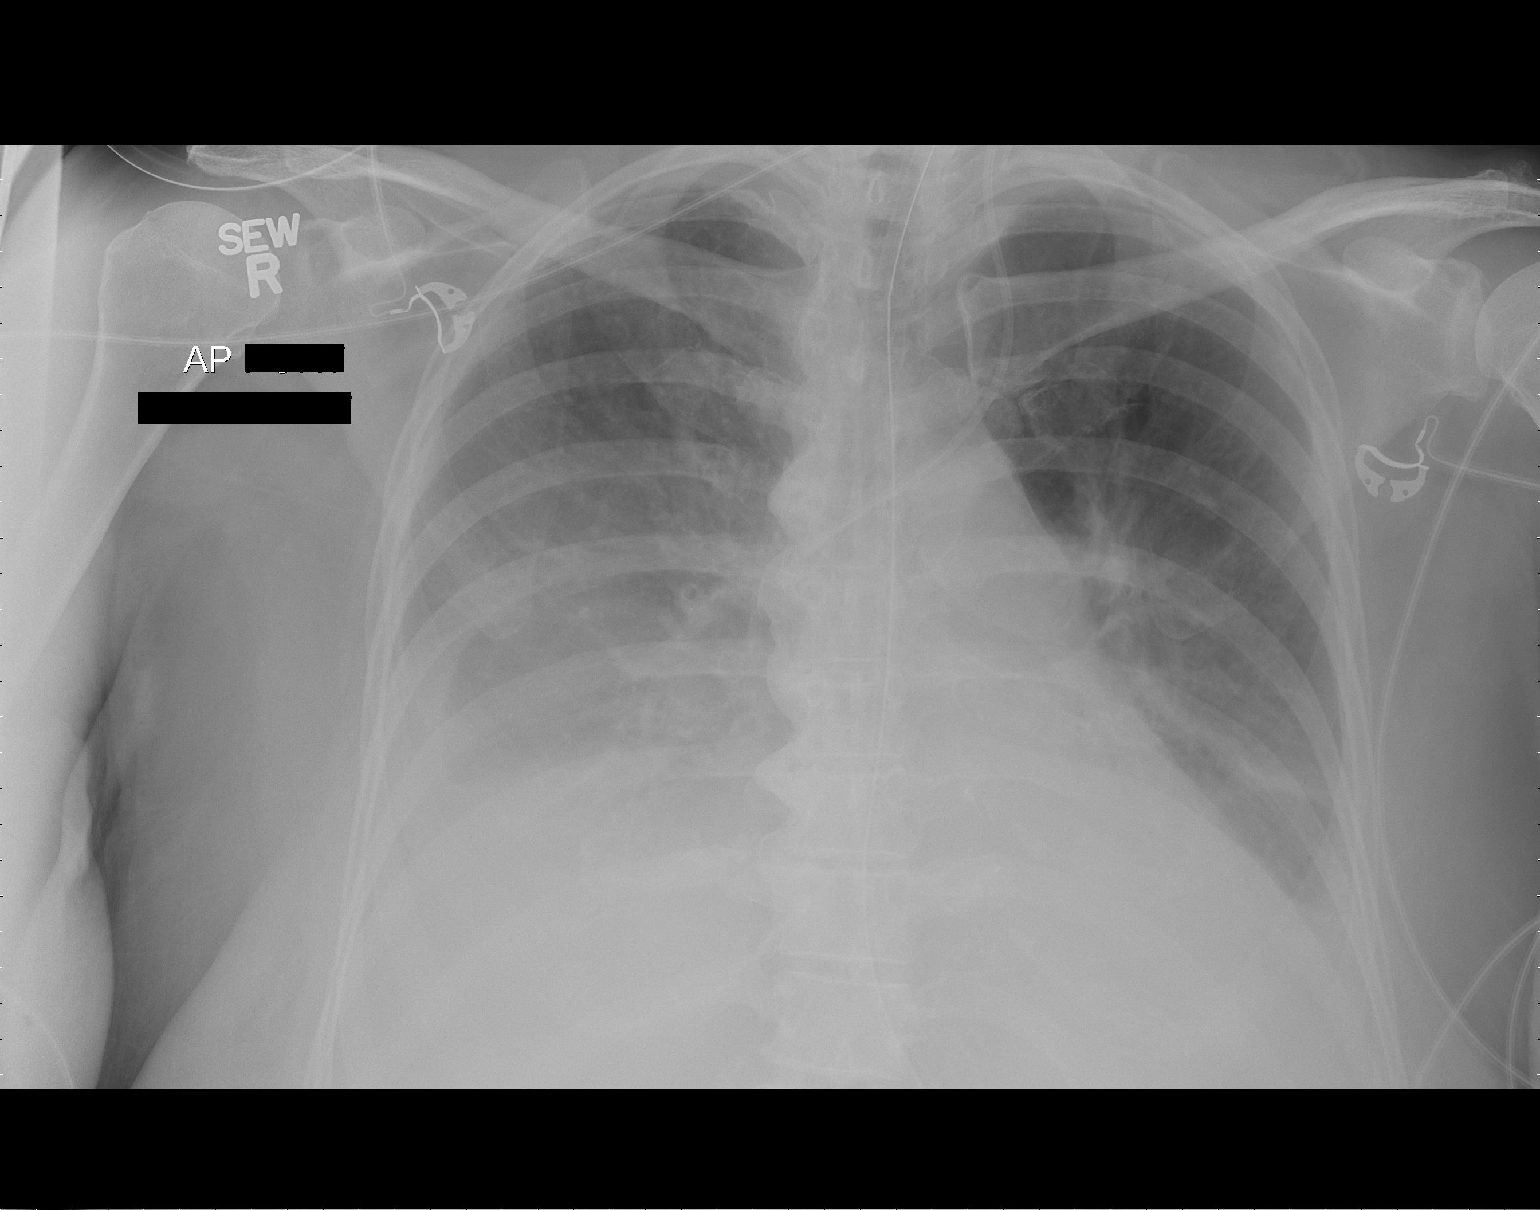

[1 of 1 positions shown; findings below may reference images not displayed]

FINDINGS: Left lower lobe atelectasis / consolidation persist.
Probable atelectasis / consolidation at the right base as well.
Subsegmental atelectasis left lower lung zone.  Small pleural
effusions.  NG tube is traversing esophagus and proximal stomach.
Left jugular central venous catheter is in the upper SVC.
IMPRESSION: Pleural effusions and bilateral lower lobe atelectasis /
consolidation persist.

## 2009-11-10 ENCOUNTER — Ambulatory Visit: Payer: Self-pay | Admitting: Physical Medicine & Rehabilitation

## 2009-11-10 IMAGING — CR DG CHEST 1V PORT
1 series · 1 of 1 positions shown · non-contrast
Comparison: [DATE] at [UC] hours.  Current study was performed
at [UC] hours.

CLINICAL DATA: History of PICC placement on the right.  History of
septic shock and renal failure.

PORTABLE CHEST - 1 VIEW

[view not recorded]
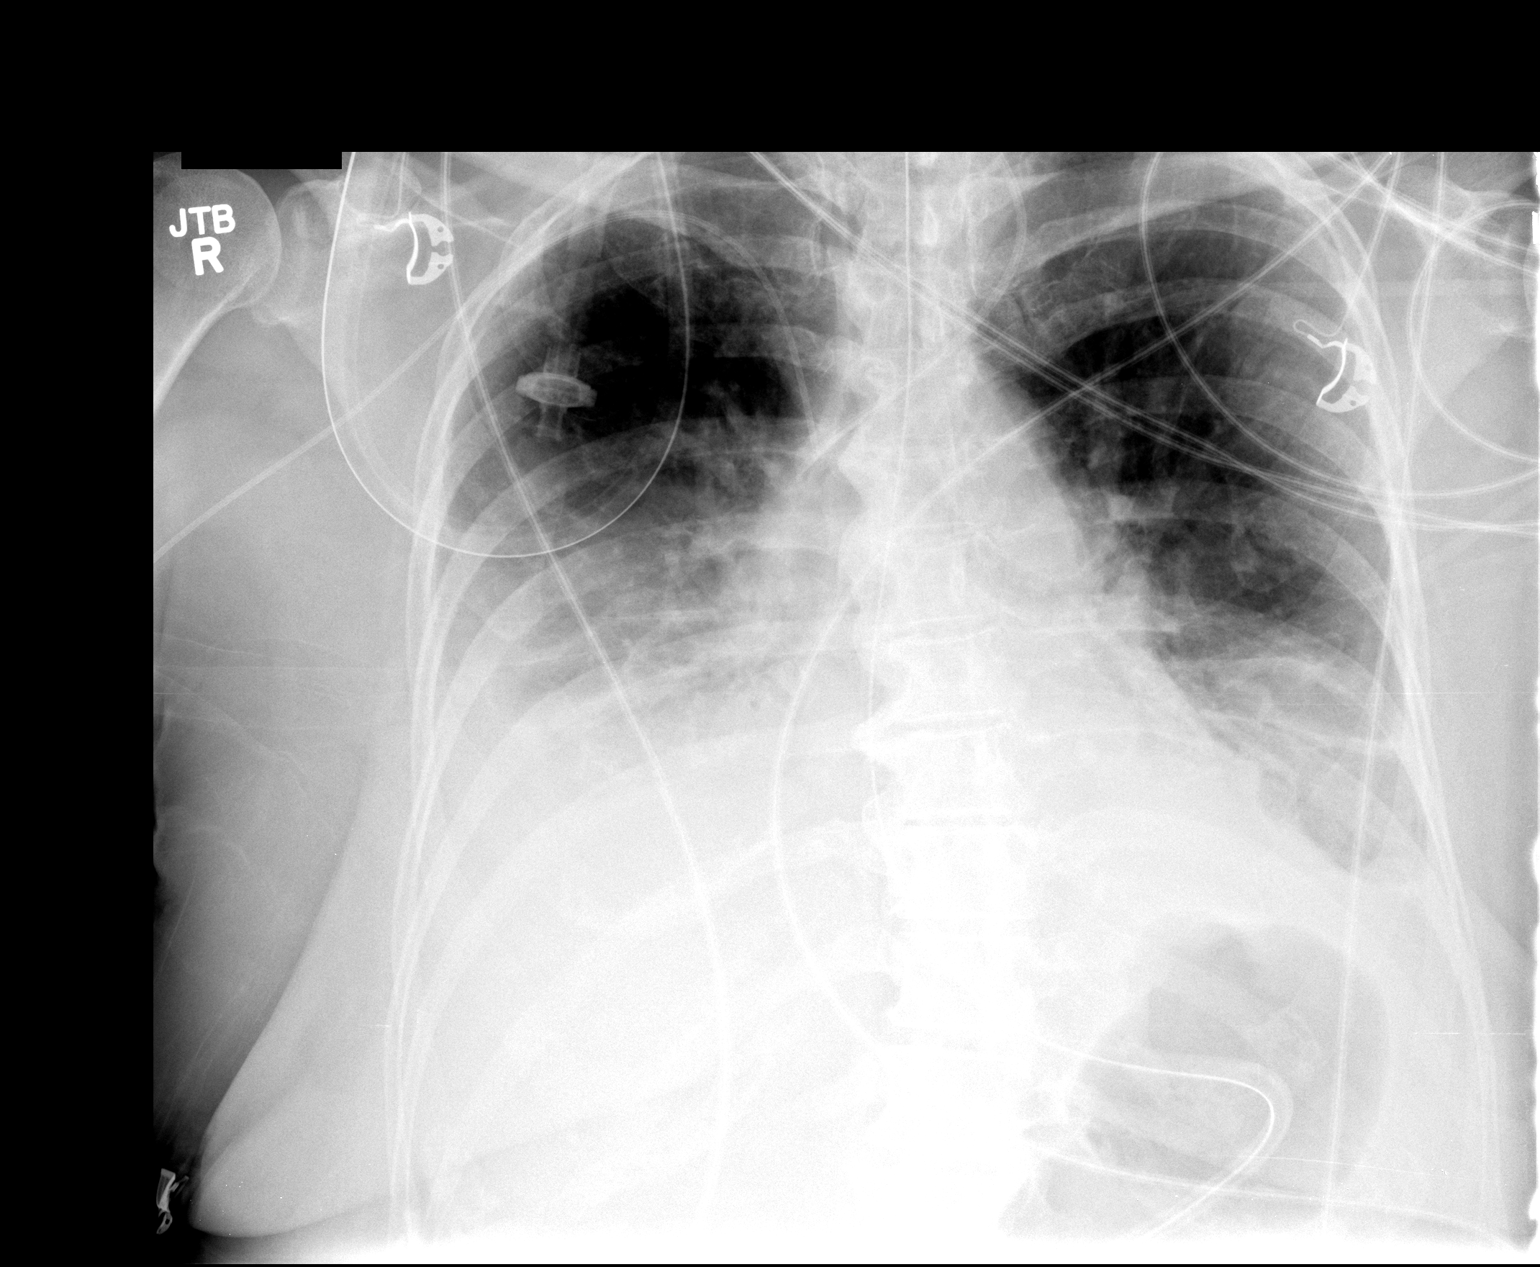

[1 of 1 positions shown; findings below may reference images not displayed]

FINDINGS: Right PICC terminates in superior vena cava.  No
pneumothorax is evident.  Enteric tube distal portion enters body
of stomach.  Tip is not included on image. Left internal jugular
venous catheter tip terminates in superior vena cava. The cardiac
silhouette is minimally enlarged. Bilateral lower lobe atelectasis,
airspace opacities and pleural effusions are seen right greater
than left.  These do not appear significantly changed from the
previous study.  Osteophytes are present in the spine.
IMPRESSION: Right PICC terminates in superior vena cava.  No pneumothorax is
evident. Left internal jugular venous catheter terminates in
superior vena cava.  Enteric tube is in place.  Bilateral lower
lung infiltrates are seen with atelectasis and airspace opacities
as well as bilateral pleural effusions, right greater than left.
These do not appear significantly changed.

## 2009-11-10 IMAGING — CR DG CHEST 1V PORT
1 series · 1 of 1 positions shown · non-contrast
Comparison: [DATE]

CLINICAL DATA: Septic shock.  Abdominal pain.  Renal failure.

PORTABLE CHEST - 1 VIEW

[view not recorded]
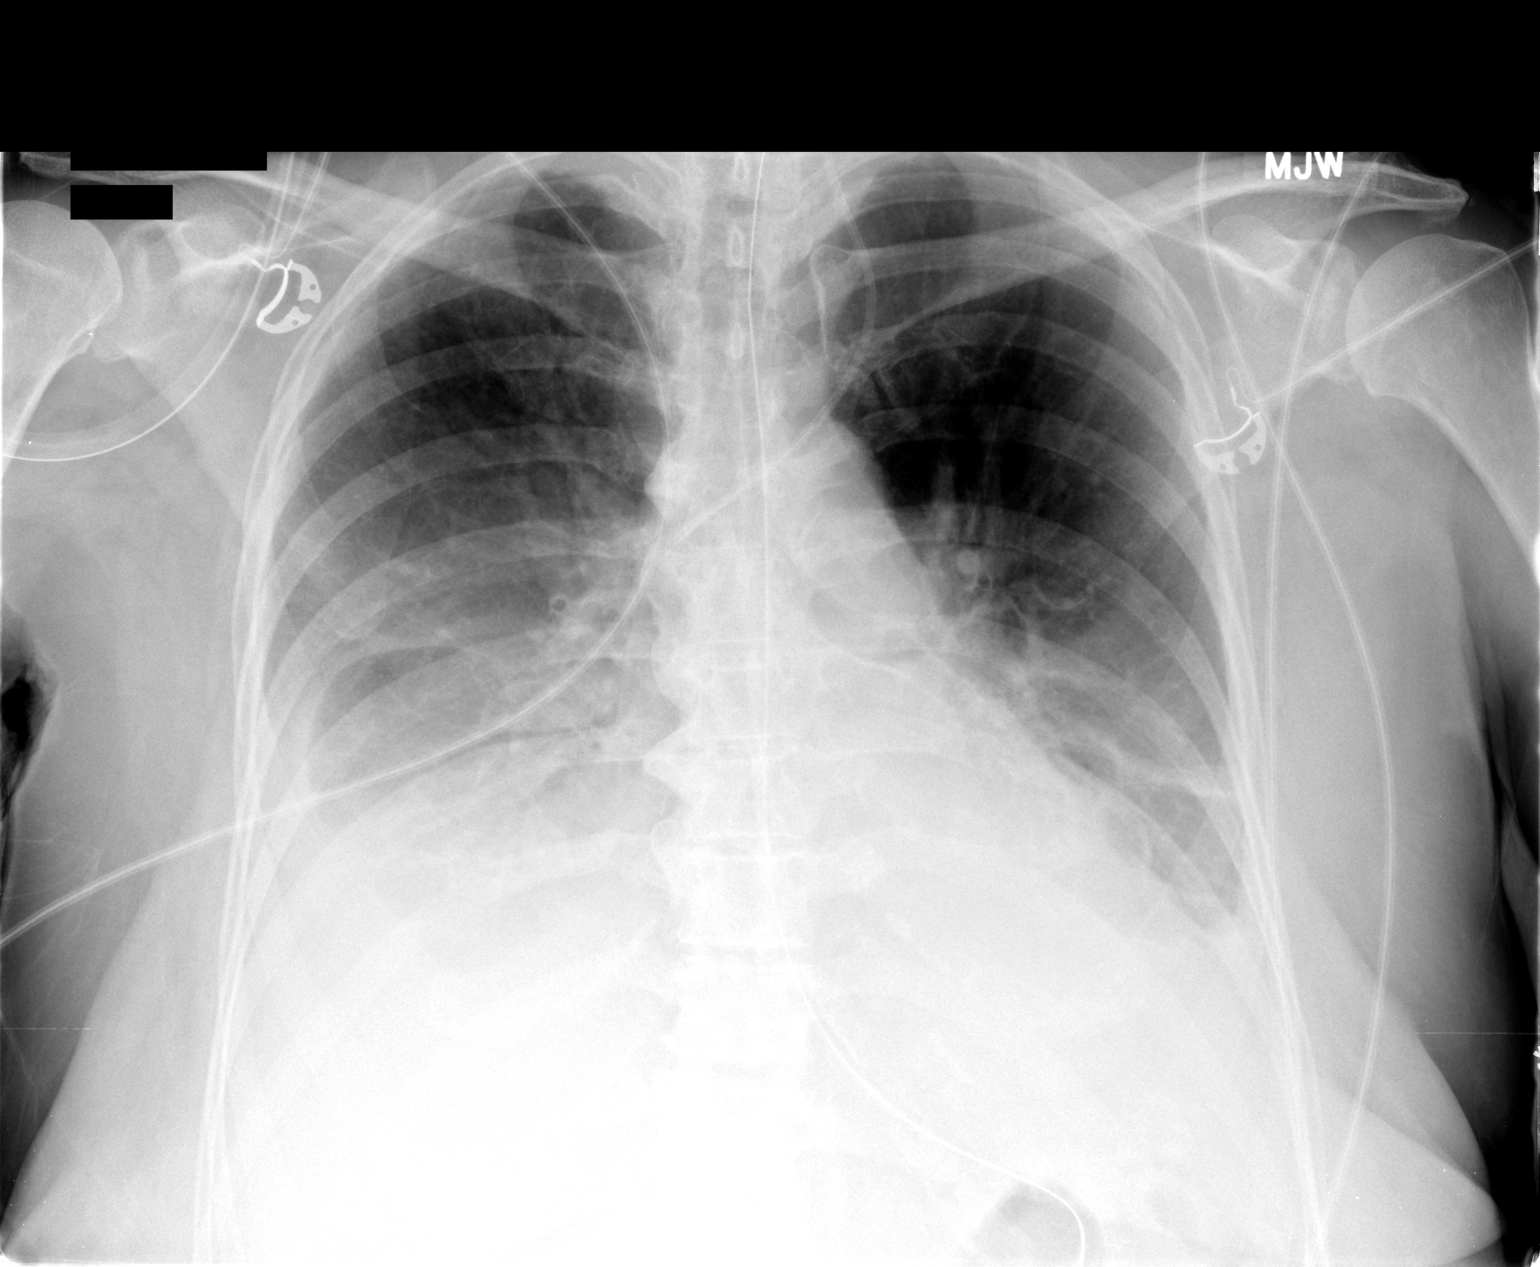

[1 of 1 positions shown; findings below may reference images not displayed]

FINDINGS: Again appreciated is bilateral lower lobe atelectasis /
consolidation and pleural effusions.  Left jugular CVC is in the
upper SVC.  NG tube is traversing esophagus and proximal to mid
stomach.
IMPRESSION: No significant change in bilateral lower lobe atelectasis /
consolidation and pleural effusions.

## 2009-11-14 IMAGING — CR DG CHEST 2V
2 series · 2 of 2 positions shown · non-contrast
Comparison: [DATE], [DATE], [DATE].

CLINICAL DATA: Dyspnea .  Recent colectomy.

CHEST - 2 VIEW

[w chest pa]
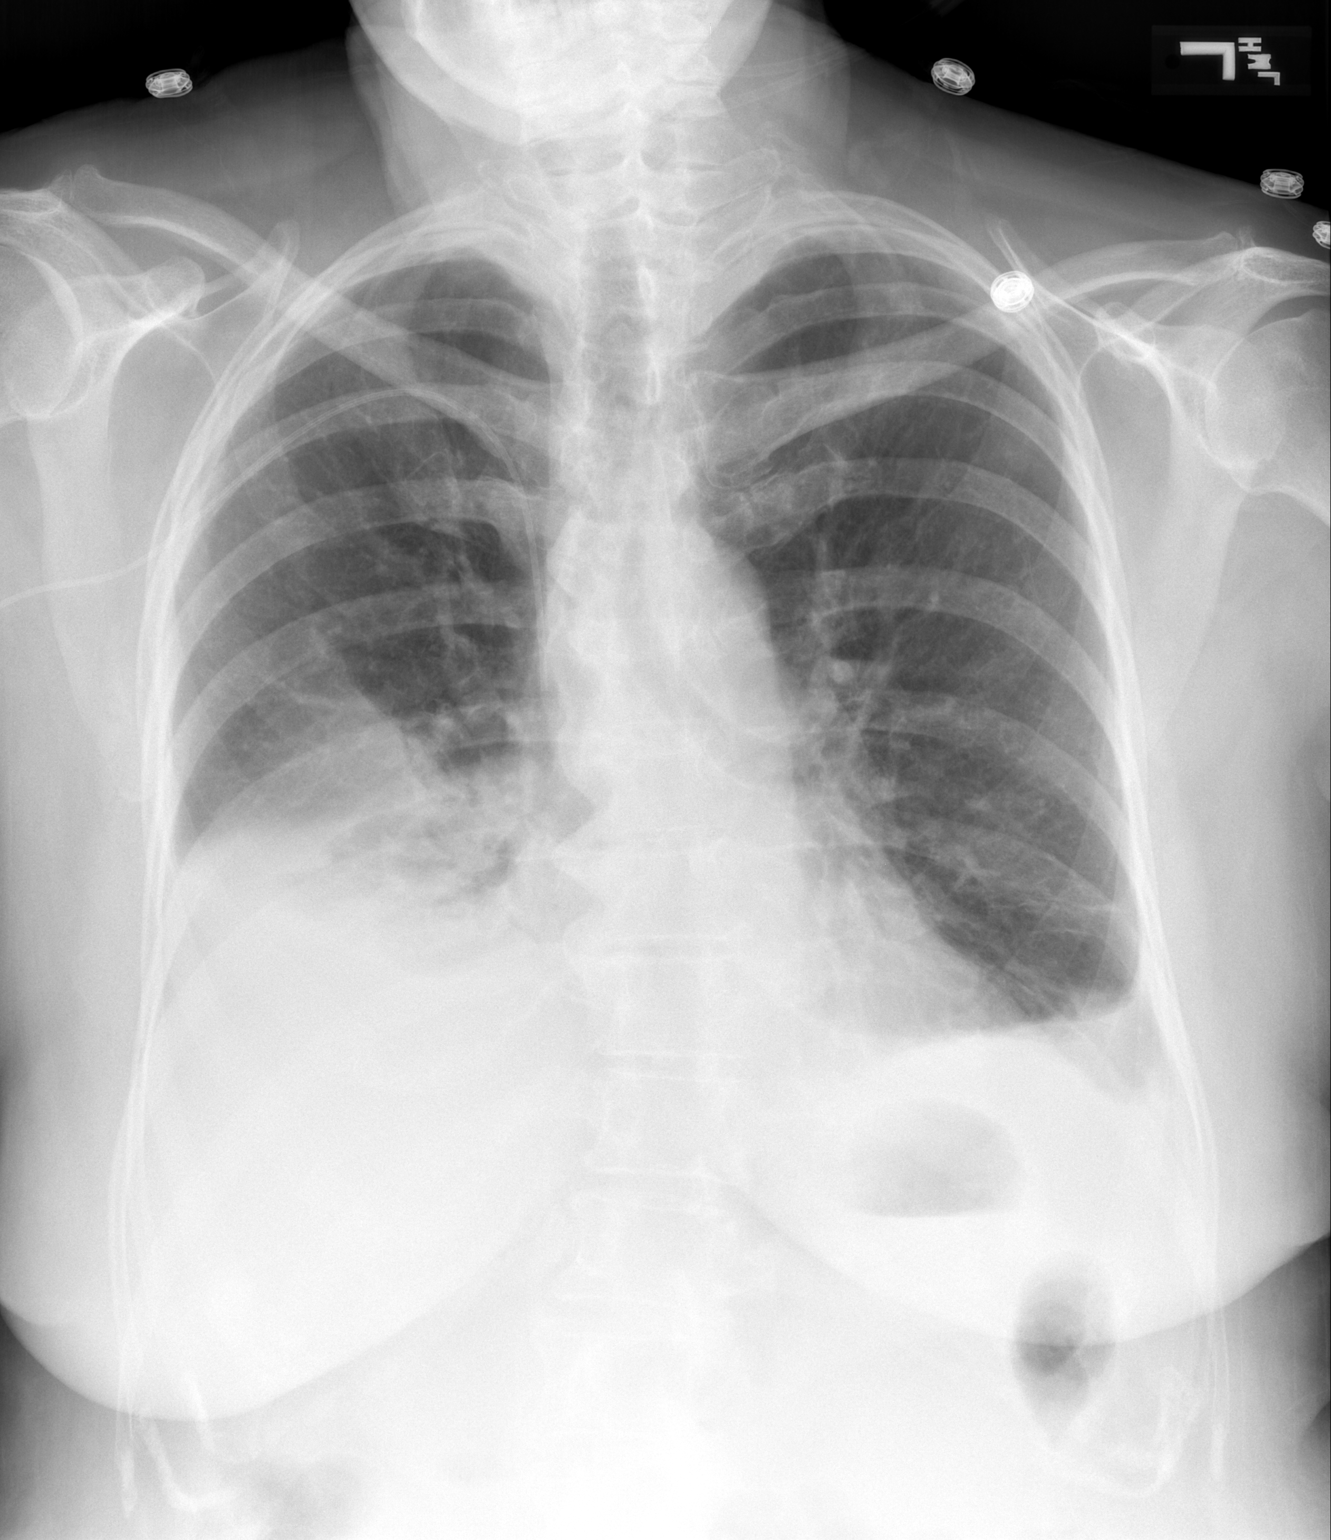

[w chest lat]
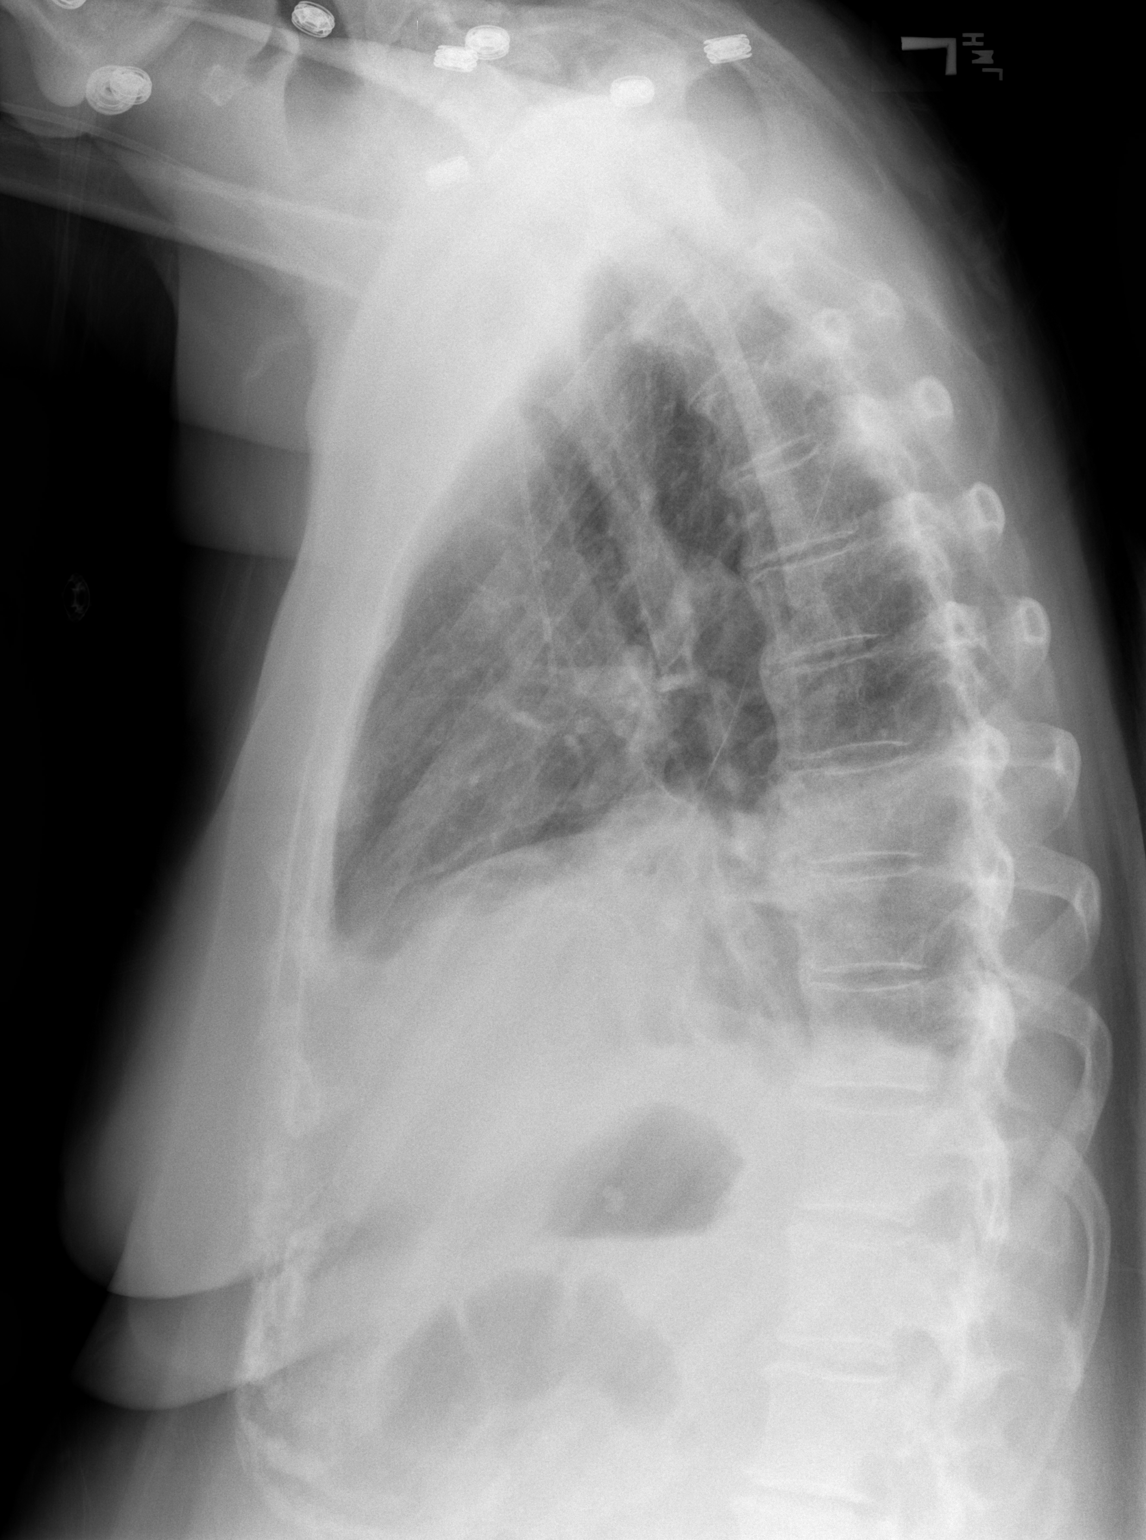

[2 of 2 positions shown; findings below may reference images not displayed]

FINDINGS: Right upper extremity PICC is present with the tip in the
mid superior vena cava, stable.  Interval removal of left IJ
catheter and nasogastric tube.

A moderate right and small left pleural effusion appear without
significant change compared to a portable chest radiograph of
[DATE].  Right basilar atelectasis, and possible airspace
disease appears similar to recent prior.  There is streaky
atelectasis at the left lung base, with some interval improvement
aeration in the left base.  The upper lung fields are clear.  No
acute osseous abnormality.
IMPRESSION: 1. Moderate right and small left pleural effusions are without
significant change.
 2.  Right basilar airspace disease and/or atelectasis is similar
to [DATE].
 3.  Improving aeration of the left lung base and upper lung
fields.

## 2009-11-16 ENCOUNTER — Ambulatory Visit: Payer: Self-pay | Admitting: Psychiatry

## 2009-11-16 IMAGING — US US ABDOMEN COMPLETE
1 series · 13 of 25 positions shown · non-contrast
Comparison: None

CLINICAL DATA: Septic shock, renal failure, right upper quadrant
pain

ULTRASOUND ABDOMEN:
TECHNIQUE: Sonography of upper abdominal structures was performed.

[Series 1: us abdomen complete · 0.29mm/px · 13 of 43 slices shown]
[im 1/43]
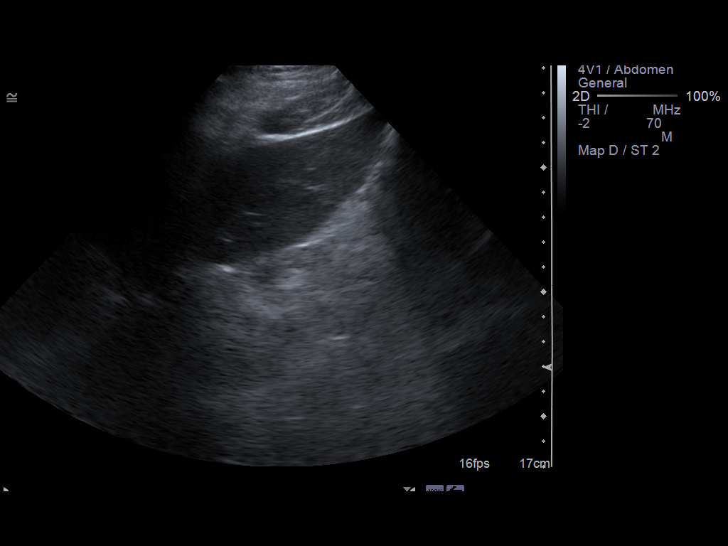
[im 4/43]
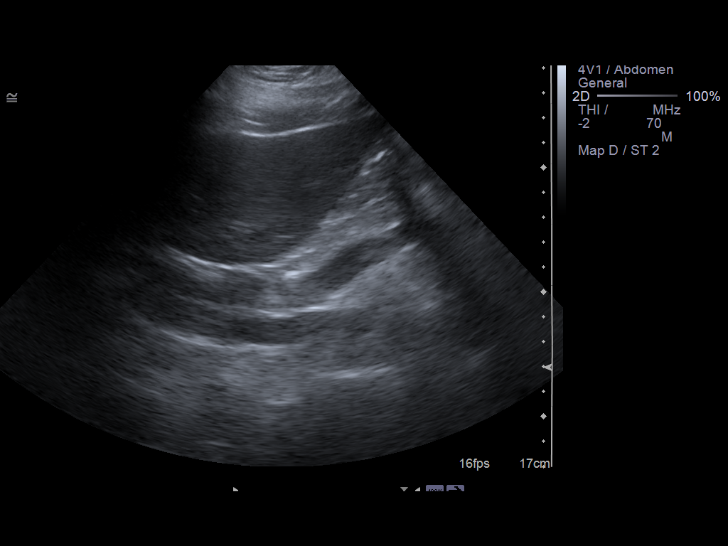
[im 8/43]
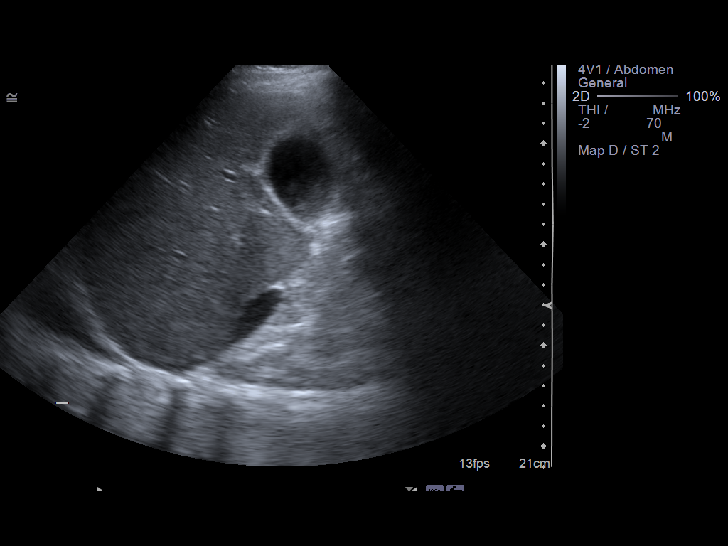
[im 11/43]
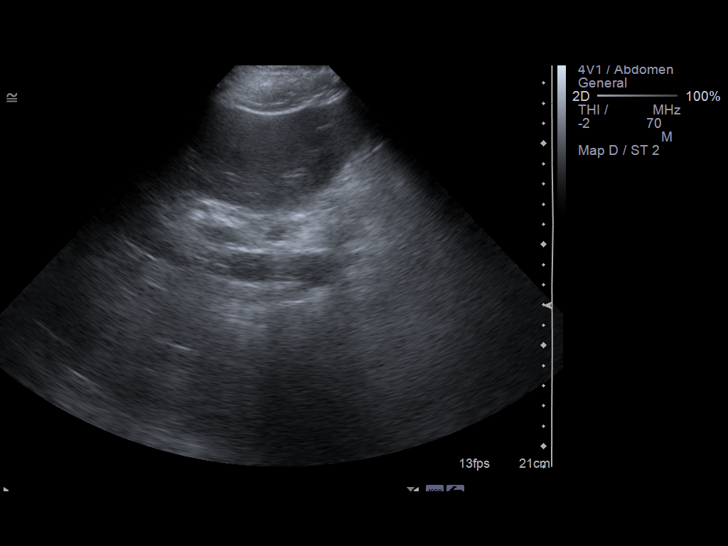
[im 15/43]
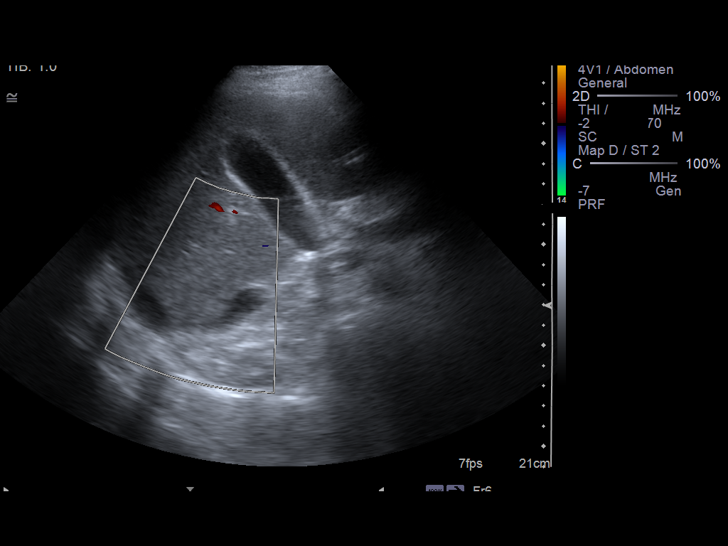
[im 18/43]
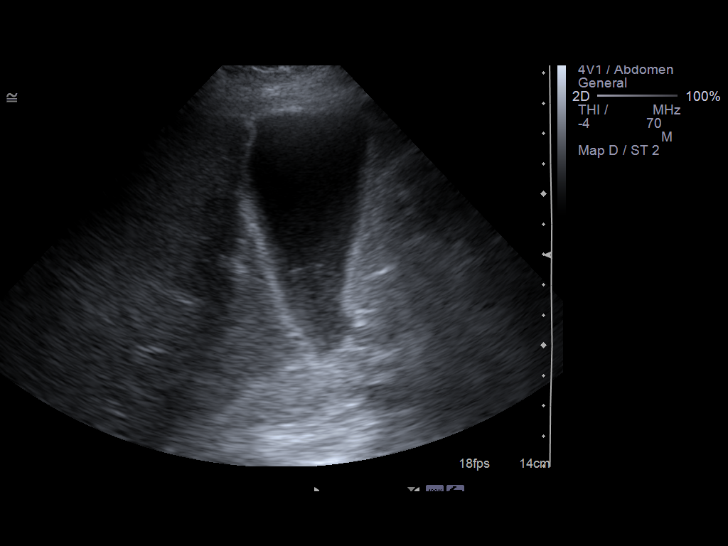
[im 22/43]
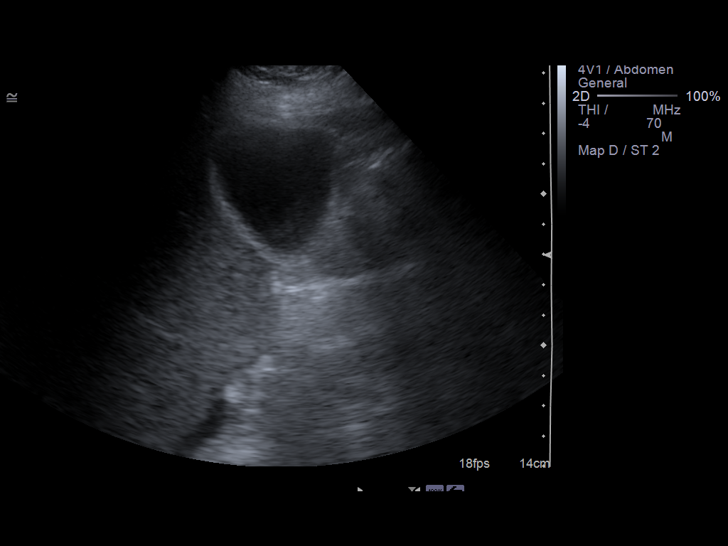
[im 25/43]
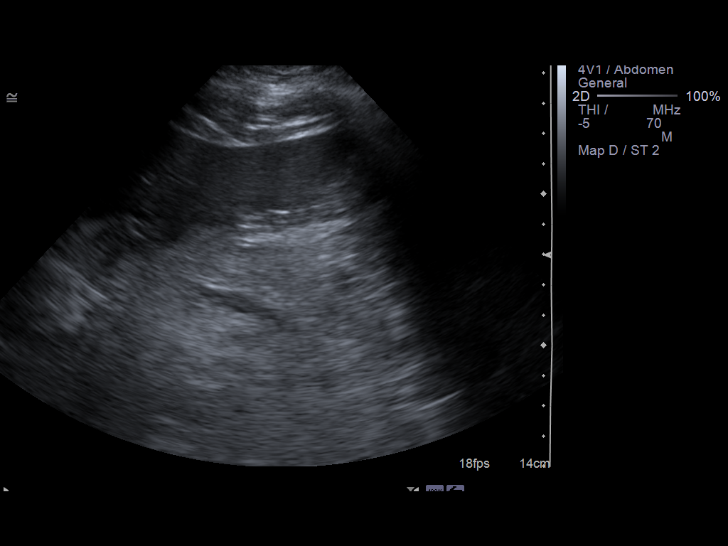
[im 29/43]
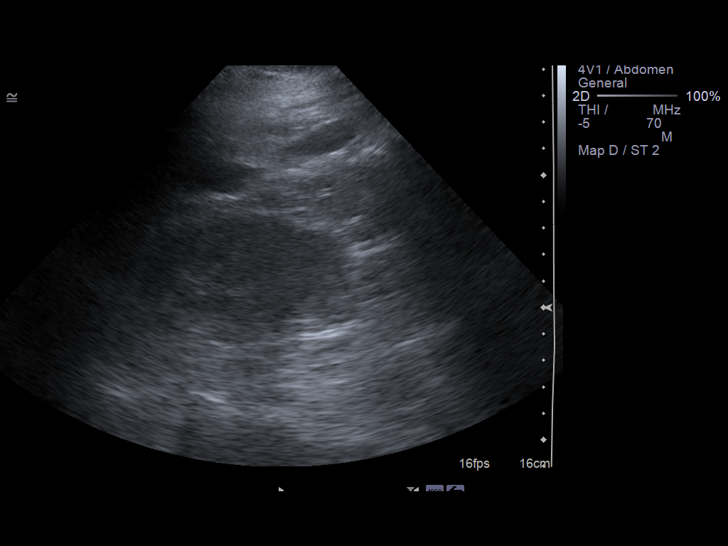
[im 32/43]
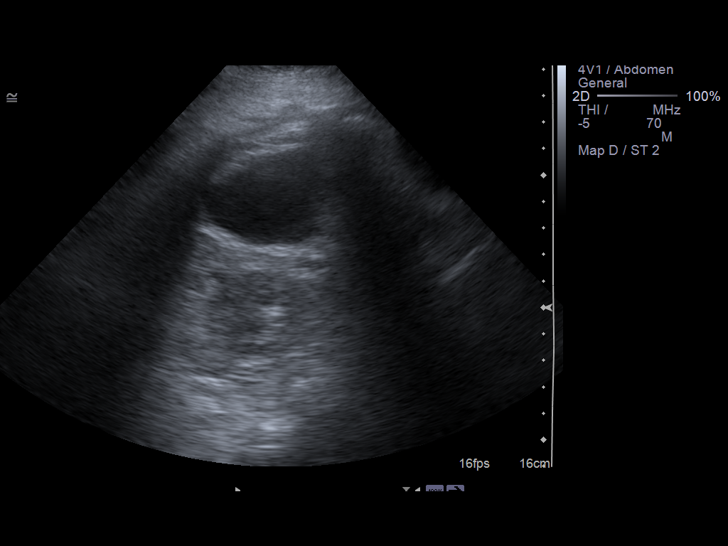
[im 36/43]
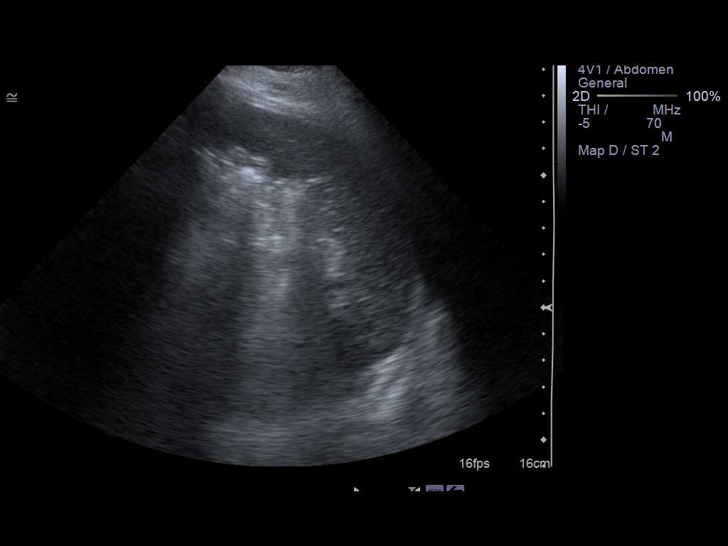
[im 39/43]
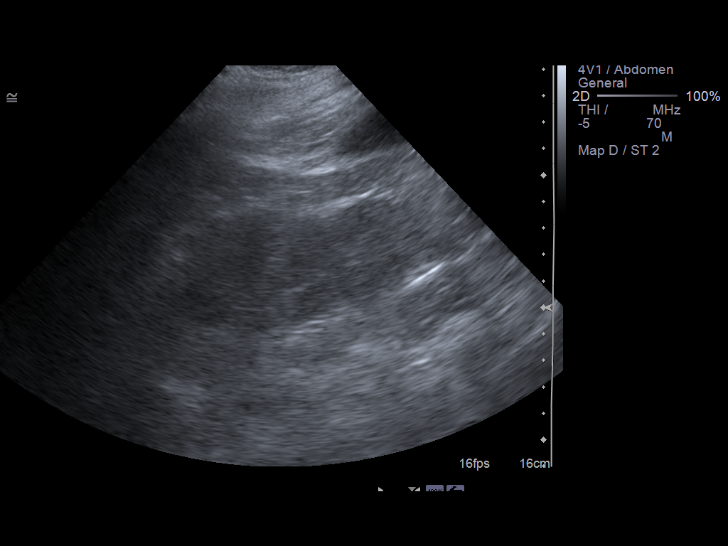
[im 43/43]
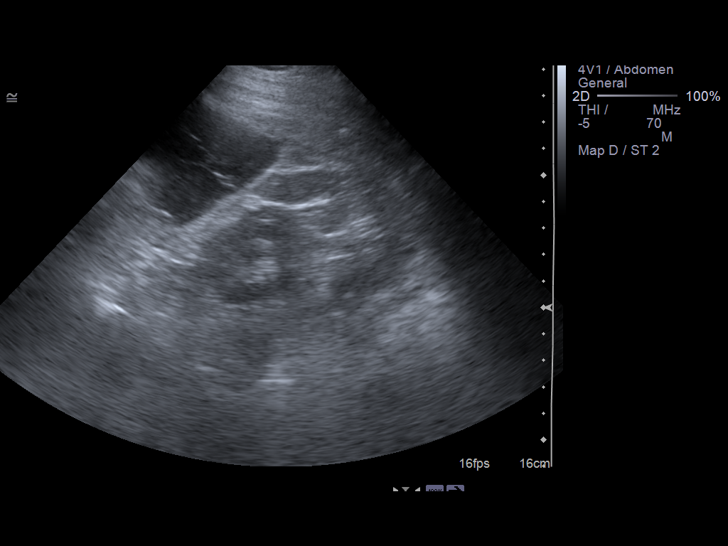

[13 of 25 positions shown; findings below may reference images not displayed]

Gallbladder:  Sludge within gallbladder lumen.  No definite
shadowing calculi.  Wall appears minimally thickened 3.5 mm thick.
Sonographic Murphy's sign is present.  Acalculous cholecystitis not
excluded.

Common bile duct:  Normal caliber 3 mm diameter.

Liver:  Normal appearance

IVC:  Unremarkable

Pancreas:  Limited visualization due to bowel gas, only a small
portion of the pancreatic body adequately visualized and
unremarkable.

Spleen:  Normal appearance, 11.7 cm length.

Right kidney:  11.5 cm length.  Normal morphology without mass or
hydronephrosis.

Left kidney:  11.0 cm length.  Grossly normal morphology without
mass or hydronephrosis.

Aorta:  Unremarkable

Other:  Right pleural effusion noted.
Small amount of perihepatic free fluid adjacent to right lobe of
liver.
IMPRESSION: Right pleural effusion.
Minimal perihepatic ascites.
Distended gallbladder containing sludge with minimally thickened
wall and presence of a sonographic Murphy's sign, cannot exclude
acalculous cholecystitis.

## 2009-11-18 IMAGING — NM NM LIVER FUNCTION STUDY
2 series · 7 of 7 positions shown · non-contrast
Comparison: None.

CLINICAL DATA: Septic shock/renal failure/abdominal pain

NUCLEAR MEDICINE HEPATOBILIARY IMAGING
TECHNIQUE: Sequential images of the abdomen were obtained [DATE] minutes following intravenous administration of
radiopharmaceutical.
Radiopharmaceutical:  5.4 mCi [BM] Choletec and 3.5 mg morphine
sulfate.

[he hepatobiliary · 1 of 1 slices shown (1 of 2)]
[im 1/1]
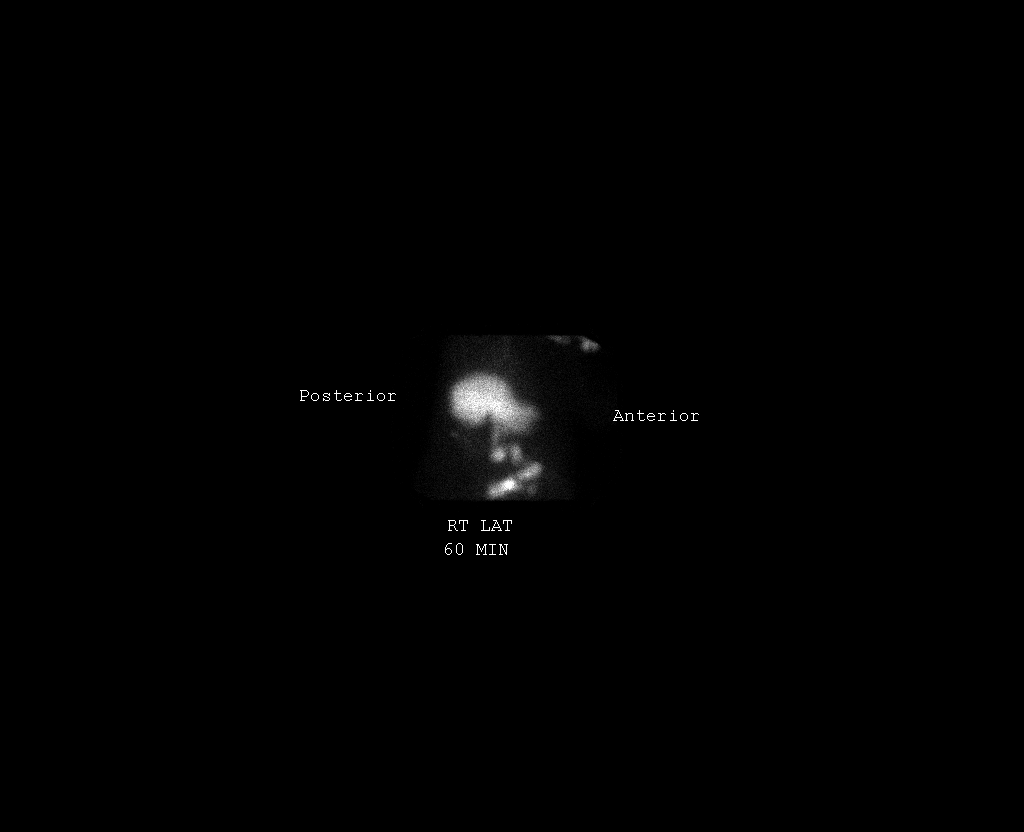

[he hepatobiliary · 3.43mm/px · 6 of 60 frames shown (2 of 2)]
[frame 6/60]
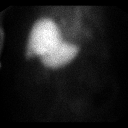
[frame 16/60]
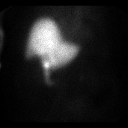
[frame 26/60]
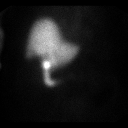
[frame 36/60]
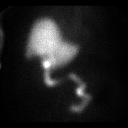
[frame 46/60]
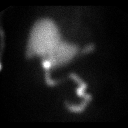
[frame 56/60]
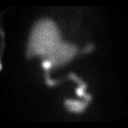

[7 of 7 positions shown; findings below may reference images not displayed]

FINDINGS: At 1 hour, there is isotope in the biliary tree and small
bowel, but none fills the gallbladder.  The patient was given
mg of morphine sulfate intravenously.  Continued imaging does show
filling of the gallbladder after morphine sulfate.  This indicates
patency of the cystic duct.
IMPRESSION: 1.  Common bile duct patent.
2.  Cystic duct patent.  There is delayed filling of the
gallbladder after intravenous morphine sulfate.  See report.

## 2009-11-20 ENCOUNTER — Inpatient Hospital Stay (HOSPITAL_COMMUNITY)
Admission: RE | Admit: 2009-11-20 | Discharge: 2009-12-03 | Payer: Self-pay | Admitting: Physical Medicine & Rehabilitation

## 2009-11-24 ENCOUNTER — Ambulatory Visit: Payer: Self-pay | Admitting: Psychiatry

## 2009-12-03 ENCOUNTER — Inpatient Hospital Stay: Admission: AD | Admit: 2009-12-03 | Discharge: 2009-12-23 | Payer: Self-pay | Admitting: Internal Medicine

## 2009-12-14 IMAGING — CR DG CHEST 2V
2 series · 2 of 2 positions shown · non-contrast
Comparison: [DATE].

CLINICAL DATA: Follow-up airspace disease.

CHEST - 2 VIEW

[w chest pa]
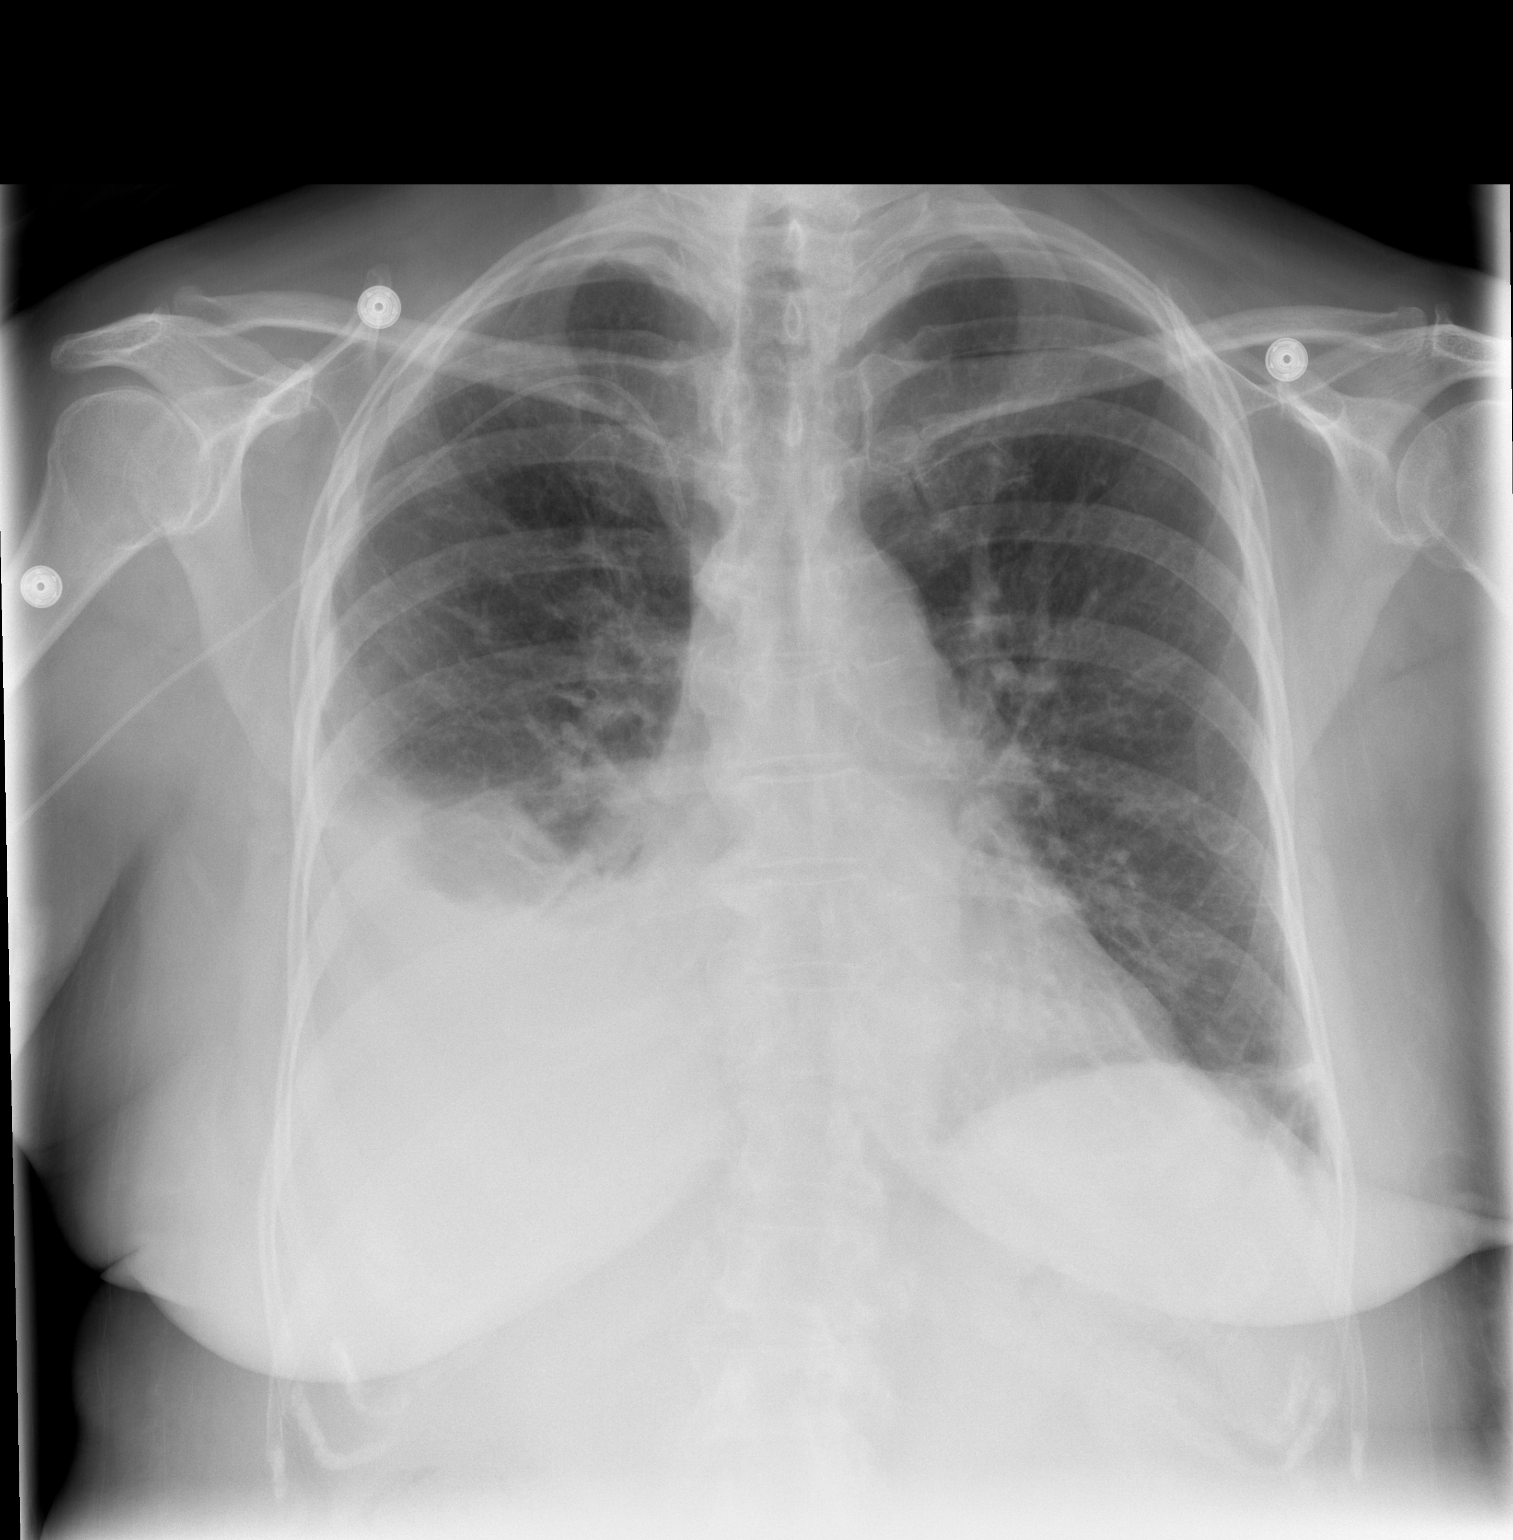

[w chest lat]
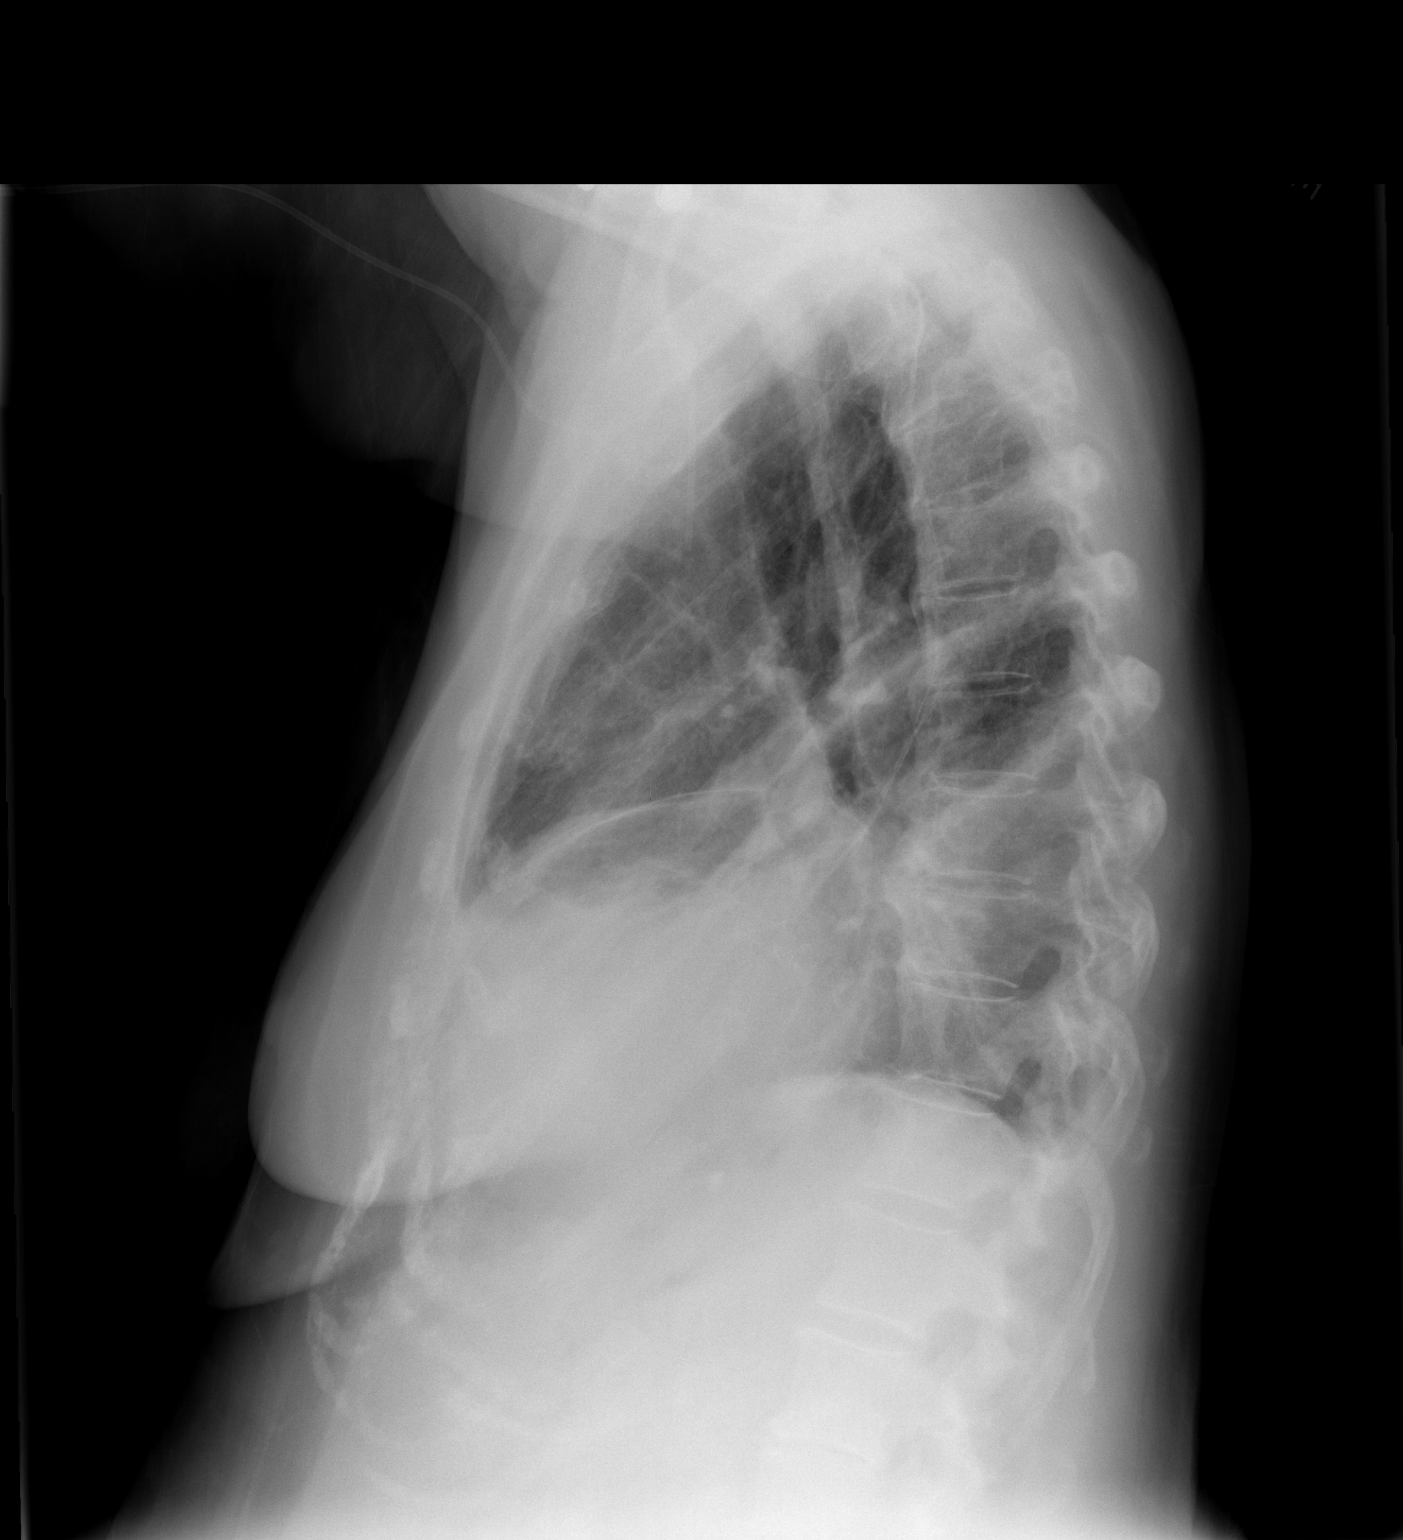

[2 of 2 positions shown; findings below may reference images not displayed]

FINDINGS: The right basilar atelectasis/infiltrate persists with
slight worsening aeration of the medial right lung base.  There is
a moderate right basilar pleural effusion.  There is slight
improvement in aeration of the left lung base.  The PICC line tip
is in the superior vena cava.
IMPRESSION: Right basilar atelectasis/infiltrate with mild worsening aeration
of the medial right lung base.  Moderate right pleural effusion.
Mild improvement in aeration of the left lung base.

## 2009-12-15 ENCOUNTER — Ambulatory Visit: Payer: Self-pay | Admitting: Psychiatry

## 2009-12-16 IMAGING — CR DG CHEST 1V
1 series · 1 of 1 positions shown · non-contrast
Comparison: [DATE]

CLINICAL DATA: Post right thoracentesis

CHEST - 1 VIEW

[view not recorded]
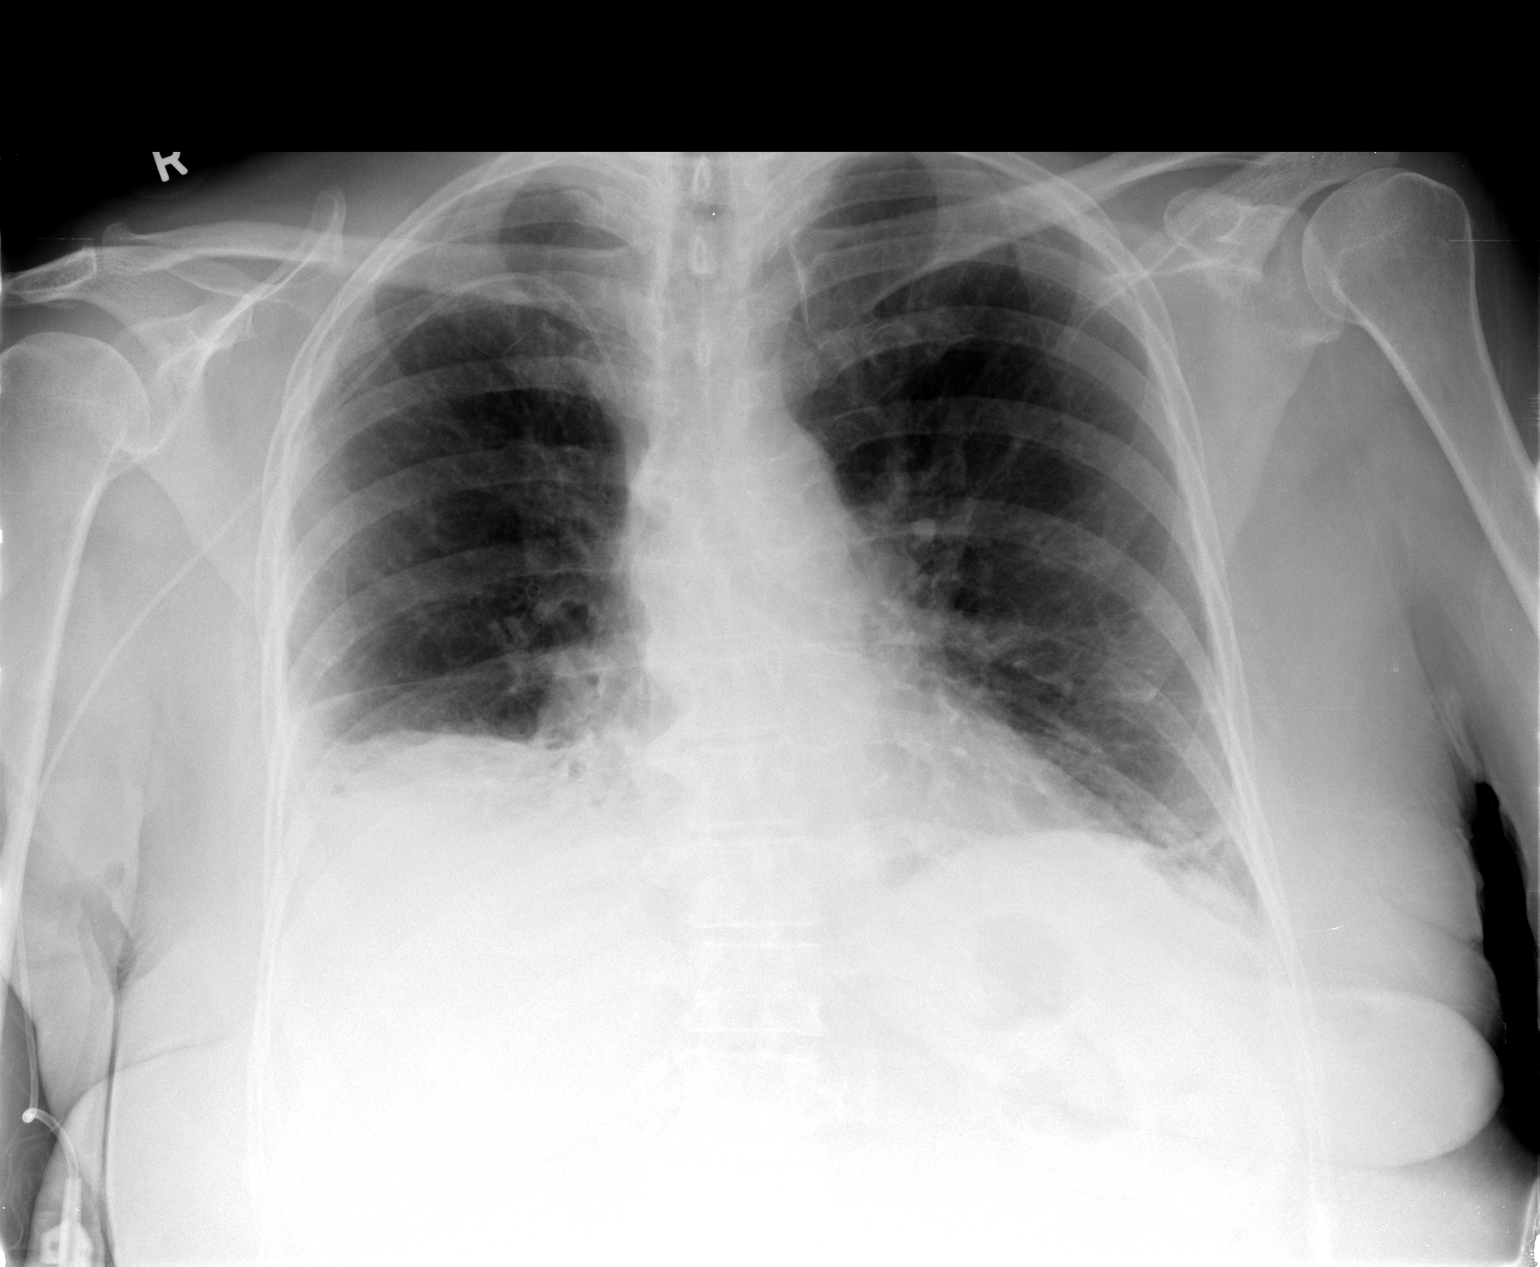

[1 of 1 positions shown; findings below may reference images not displayed]

FINDINGS: Mild improvement in right pleural effusion following
thoracentesis.  There remains a small right effusion.  Negative for
pneumothorax.  There is mild bibasilar atelectasis.  PICC tip is in
the SVC, unchanged.
IMPRESSION: Negative for pneumothorax post right thoracentesis.

## 2009-12-16 IMAGING — US US PARACENTESIS
1 series · 4 of 4 positions shown · non-contrast
Comparison: none

CLINICAL DATA: Right pleural effusion

RIGHT ULTRASOUND-GUIDED THORACENTESIS
TECHNIQUE: An ultrasound-guided thoracentesis was thoroughly
discussed with the patient and questions answered.  The benefits,
risks, alternatives and complications were also discussed.  The
patient understands and wishes to proceed with the procedure.  A
verbal as well as written consent was obtained. Ultrasound was
performed to localize and mark an adequate pocket of fluid for
thoracentesis.  The right chest wall was prepped and draped in the
normal sterile fashion.  1% Lidocaine was used for local
anesthesia.  Under ultrasound guidance a 19-gauge Yueh catheter was
introduced yielding approximately 600 ml of yellow fluid.  The
patient tolerated the procedure well and there were no immediate
complications. Post procedure chest x-ray is pending.

[Series 1: us paracentesis · 0.32mm/px · 4 of 4 slices shown]
[im 1/4]
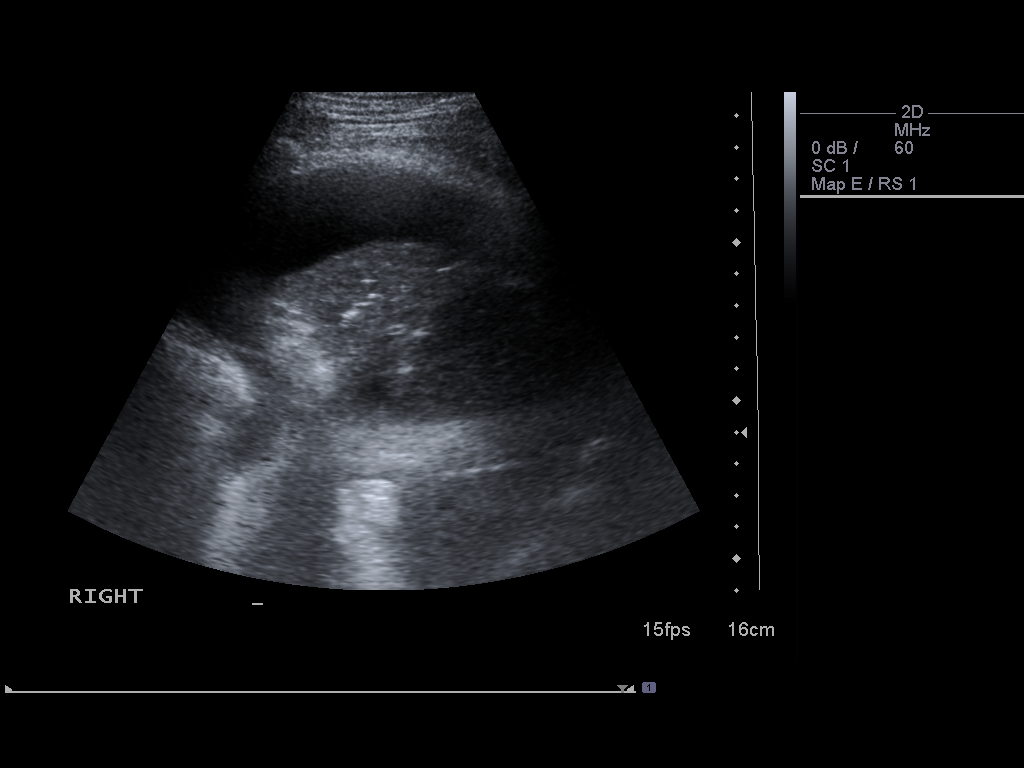
[im 2/4]
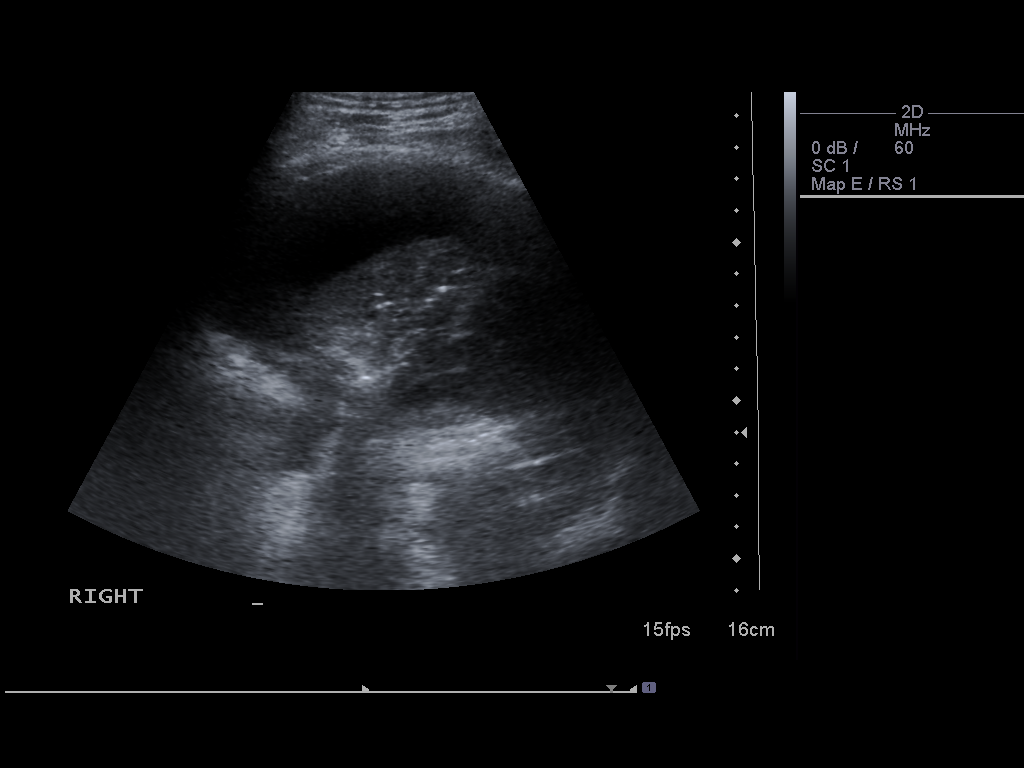
[im 3/4]
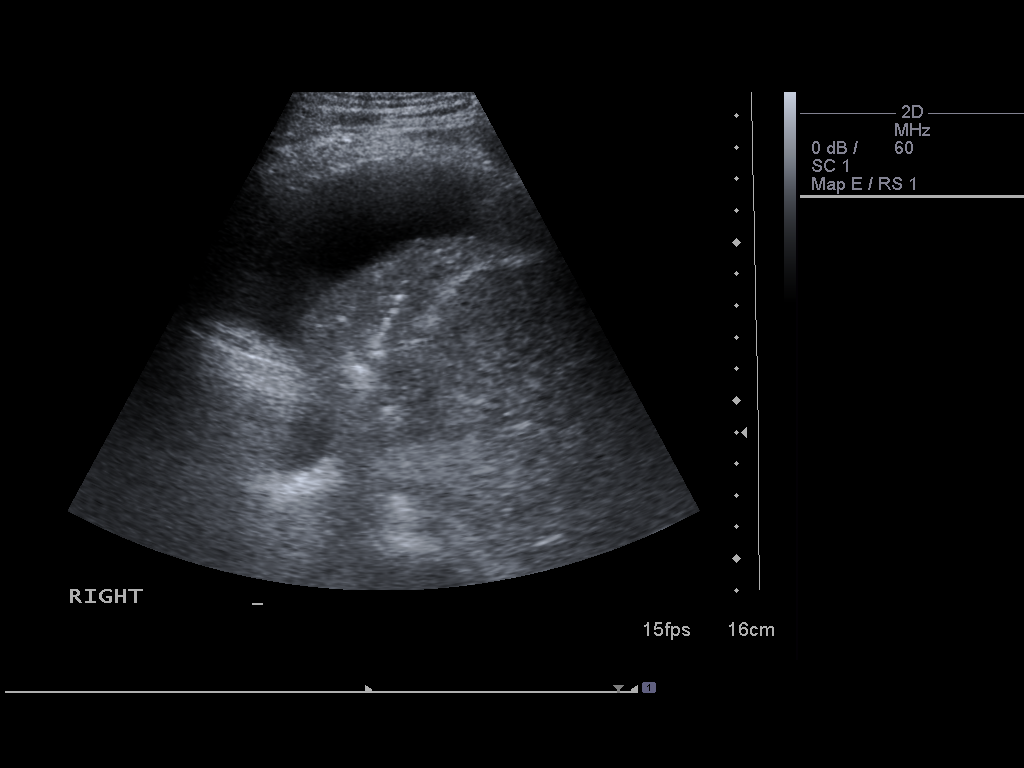
[im 4/4]
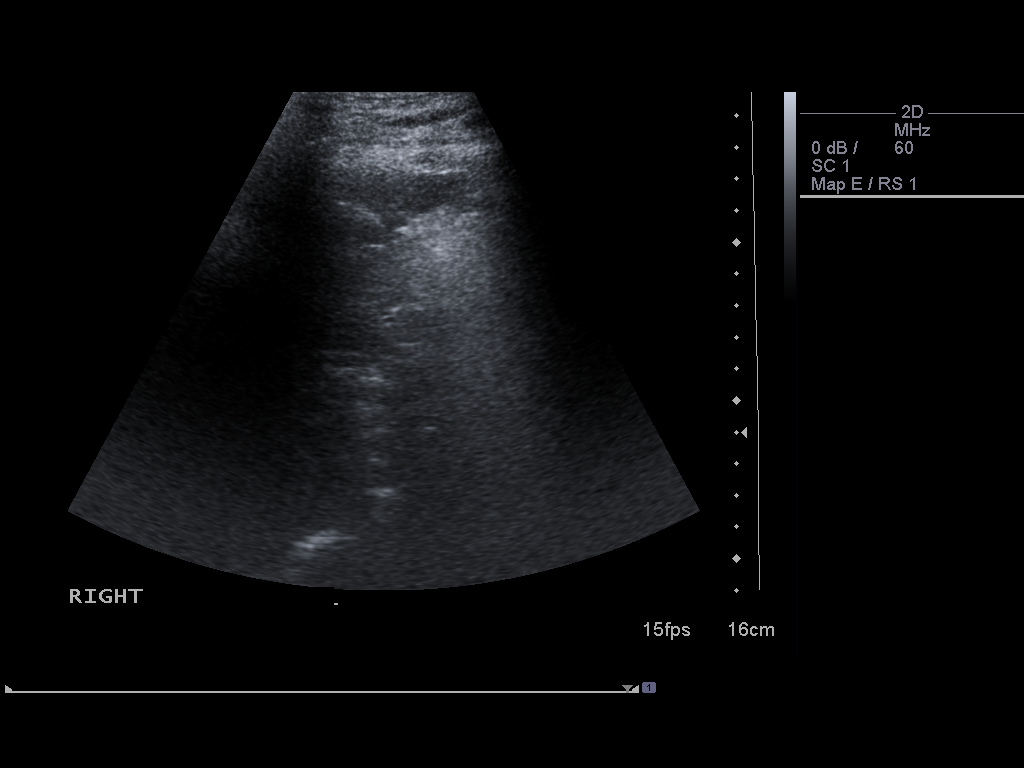

[4 of 4 positions shown; findings below may reference images not displayed]

IMPRESSION: Successful ultrasound-guided right thoracentesis yielding 600 ml of
fluid.

Read by: [email or web address].-NAVOTNAJA

## 2009-12-21 ENCOUNTER — Ambulatory Visit: Payer: Self-pay | Admitting: Psychiatry

## 2010-05-03 ENCOUNTER — Encounter: Admission: RE | Admit: 2010-05-03 | Discharge: 2010-05-03 | Payer: Self-pay | Admitting: General Surgery

## 2010-05-03 IMAGING — CR DG BE THRU COLOSTOMY
17 of 23 series · 17 of 24 positions shown · IV contrast (agent unspecified)
Comparison: CT [DATE]

CLINICAL DATA: Diverticular disease, following colostomy.  Now for
reversal.

SINGLE CONTRAST BARIUM ENEMA
Fluoroscopy Time: 1.8 minutes.
Contrast: Dilute Gastrographin.

[run (1 of 15)]
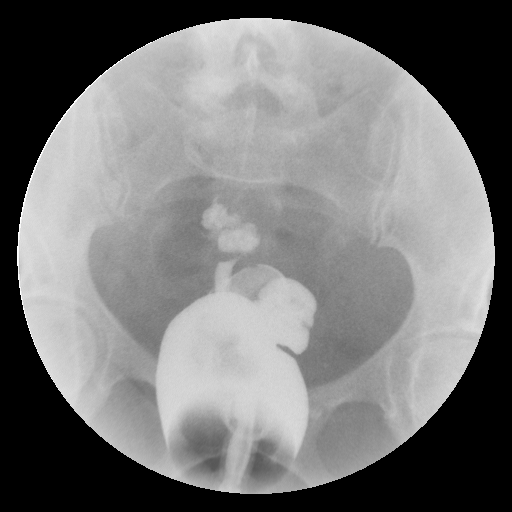

[run (2 of 15)]
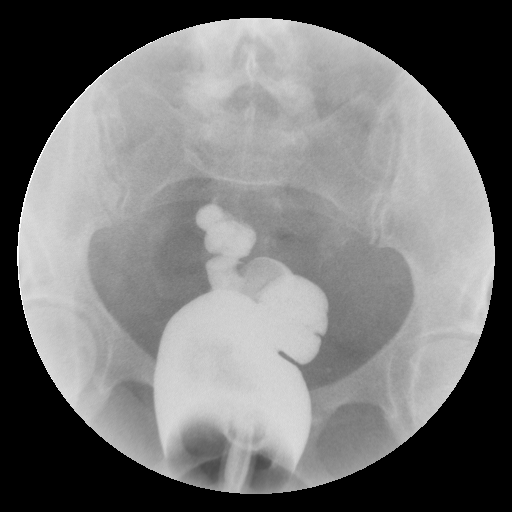

[run (3 of 15)]
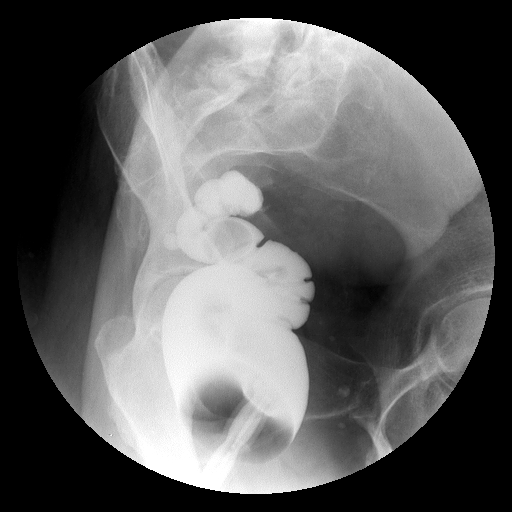

[run (4 of 15)]
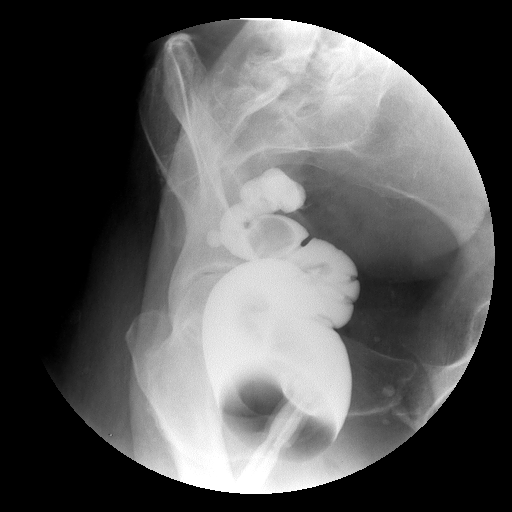

[run (5 of 15)]
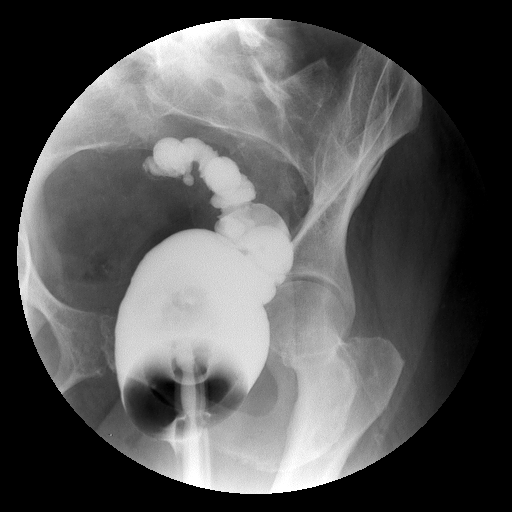

[run (6 of 15)]
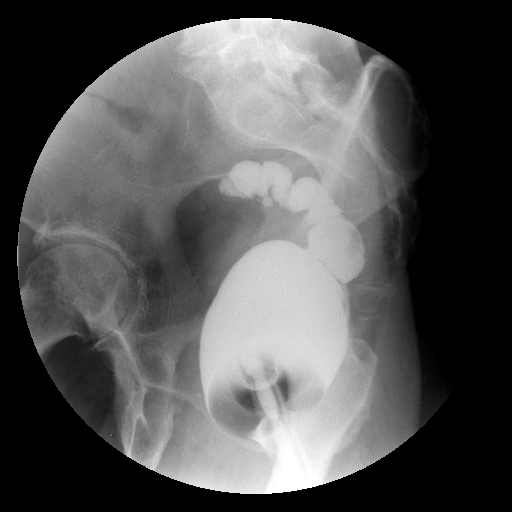

[run (7 of 15)]
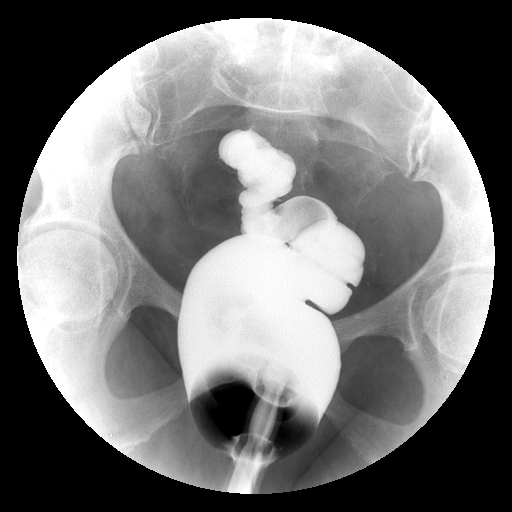

[run (8 of 15)]
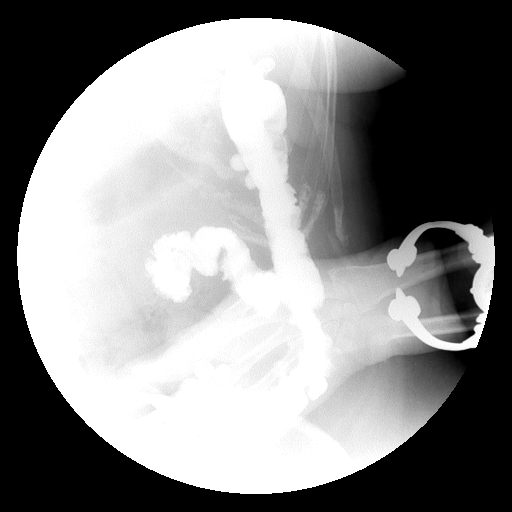

[run (9 of 15)]
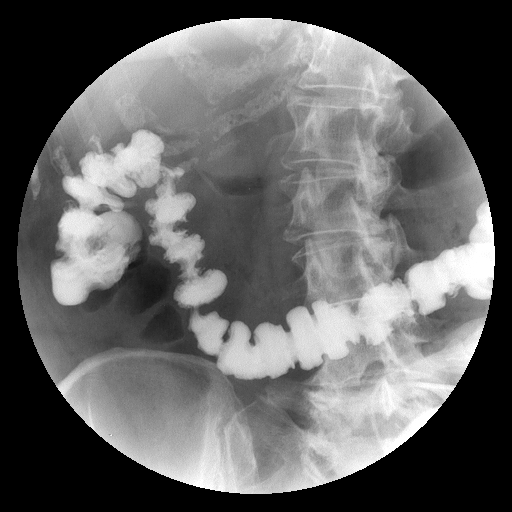

[run (10 of 15)]
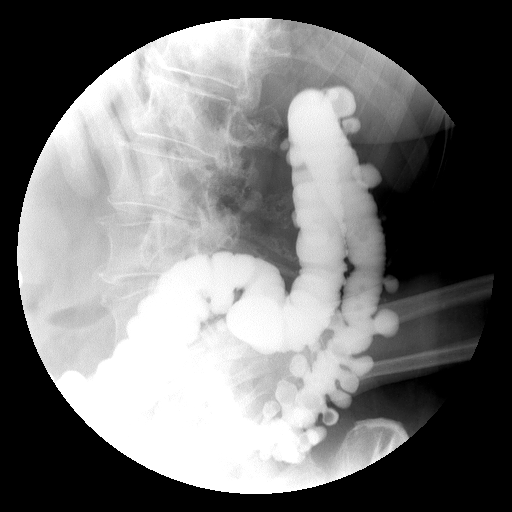

[run (11 of 15)]
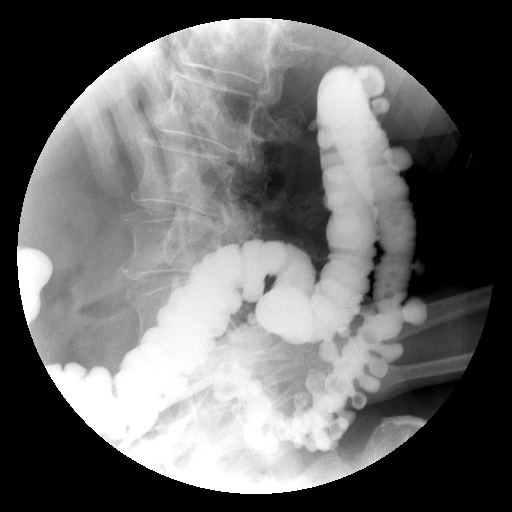

[run (12 of 15)]
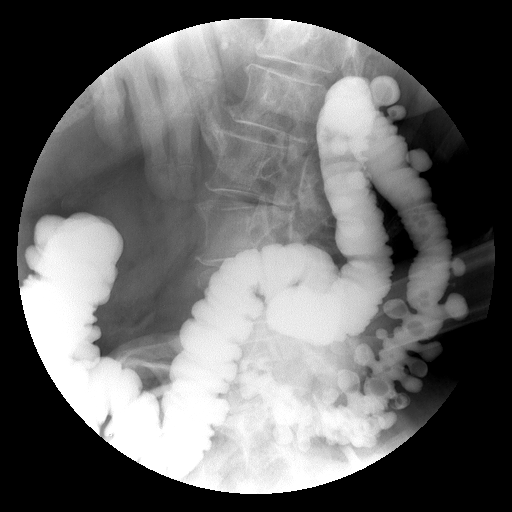

[run (13 of 15)]
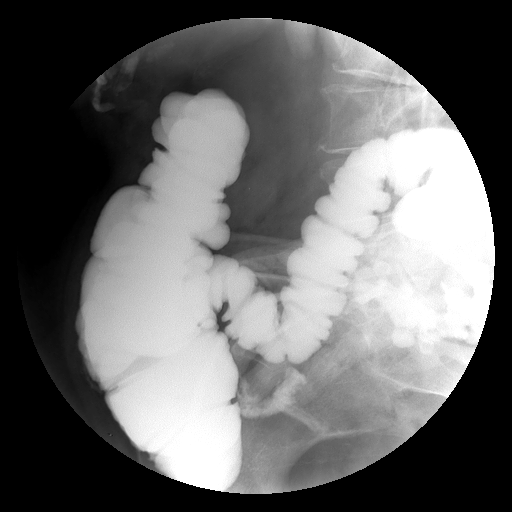

[run (14 of 15)]
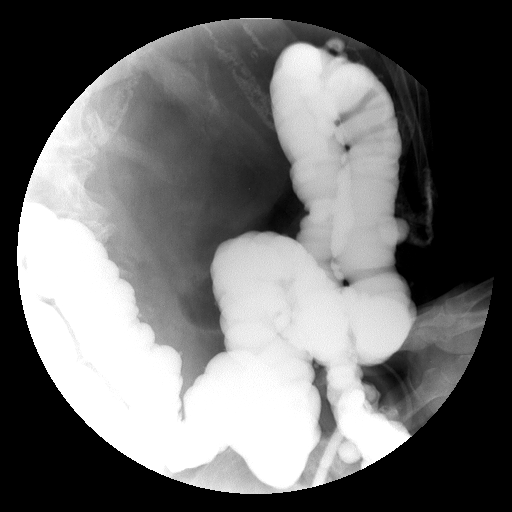

[run (15 of 15)]
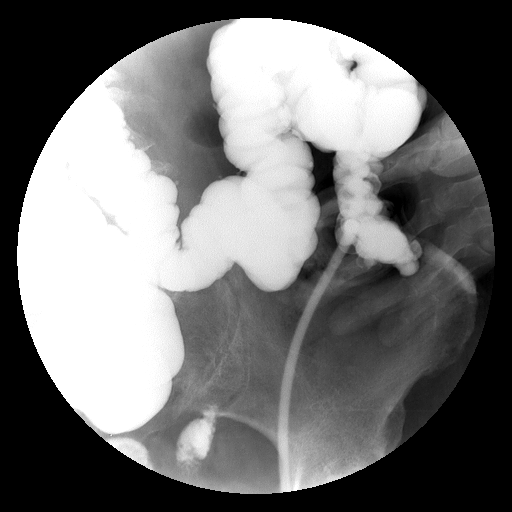

[view not recorded (1 of 2)]
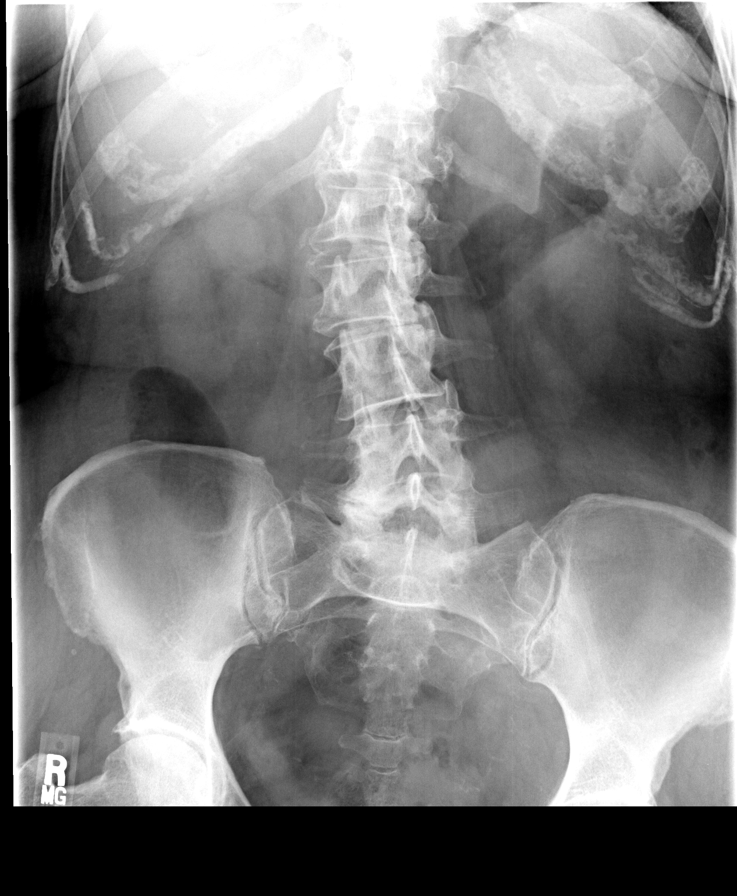

[view not recorded (2 of 2)]
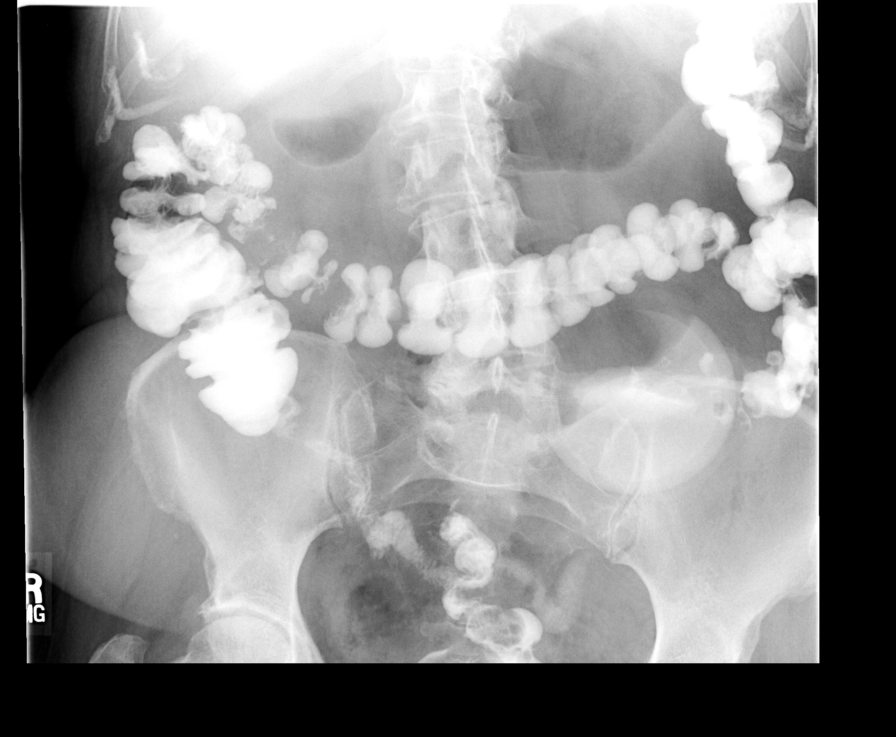

[17 of 24 positions shown; findings below may reference images not displayed]

FINDINGS: Initial gastrographin enema was performed through the
rectum.  This shows short segment of sigmoid colon remaining.
There are a few scattered sigmoid diverticula.  No evidence of
leak.

Gastrographin enema was then performed through the colostomy.  This
shows extensive diverticular disease in the descending colon and
sigmoid flexure.  No fixed stricture or definite fixed mass.  A few
scattered small diverticula noted in the transverse colon.
IMPRESSION: Extensive diverticular disease in the sigmoid colon.  A few
scattered diverticula in the sigmoid colon and transverse colon.

## 2010-05-20 ENCOUNTER — Telehealth (INDEPENDENT_AMBULATORY_CARE_PROVIDER_SITE_OTHER): Payer: Self-pay | Admitting: *Deleted

## 2010-05-24 ENCOUNTER — Ambulatory Visit: Payer: Self-pay

## 2010-05-24 ENCOUNTER — Ambulatory Visit: Payer: Self-pay | Admitting: Cardiovascular Disease

## 2010-05-24 ENCOUNTER — Encounter: Payer: Self-pay | Admitting: Cardiovascular Disease

## 2010-05-24 ENCOUNTER — Encounter (HOSPITAL_COMMUNITY)
Admission: RE | Admit: 2010-05-24 | Discharge: 2010-08-03 | Payer: Self-pay | Source: Home / Self Care | Attending: Cardiology | Admitting: Cardiology

## 2010-07-19 ENCOUNTER — Telehealth (INDEPENDENT_AMBULATORY_CARE_PROVIDER_SITE_OTHER): Payer: Self-pay | Admitting: *Deleted

## 2010-07-19 LAB — COMPREHENSIVE METABOLIC PANEL
ALT: 48 U/L — ABNORMAL HIGH (ref 0–35)
AST: 44 U/L — ABNORMAL HIGH (ref 0–37)
Albumin: 4 g/dL (ref 3.5–5.2)
Alkaline Phosphatase: 94 U/L (ref 39–117)
BUN: 16 mg/dL (ref 6–23)
CO2: 27 mEq/L (ref 19–32)
Calcium: 10.1 mg/dL (ref 8.4–10.5)
Chloride: 103 mEq/L (ref 96–112)
Creatinine, Ser: 1.64 mg/dL — ABNORMAL HIGH (ref 0.4–1.2)
GFR calc Af Amer: 36 mL/min — ABNORMAL LOW (ref >60)
GFR calc non Af Amer: 30 mL/min — ABNORMAL LOW (ref >60)
Glucose, Bld: 143 mg/dL — ABNORMAL HIGH (ref 70–99)
Potassium: 4.2 mEq/L (ref 3.5–5.1)
Sodium: 140 mEq/L (ref 135–145)
Total Bilirubin: 0.5 mg/dL (ref 0.3–1.2)
Total Protein: 7.7 g/dL (ref 6.0–8.3)

## 2010-07-19 LAB — DIFFERENTIAL
Basophils Absolute: 0 10*3/uL (ref 0.0–0.1)
Basophils Relative: 0 % (ref 0–1)
Eosinophils Absolute: 0.3 10*3/uL (ref 0.0–0.7)
Eosinophils Relative: 3 % (ref 0–5)
Lymphocytes Relative: 43 % (ref 12–46)
Lymphs Abs: 4 10*3/uL (ref 0.7–4.0)
Monocytes Absolute: 0.8 10*3/uL (ref 0.1–1.0)
Monocytes Relative: 9 % (ref 3–12)
Neutro Abs: 4.2 10*3/uL (ref 1.7–7.7)
Neutrophils Relative %: 45 % (ref 43–77)

## 2010-07-19 LAB — CBC
HCT: 40 % (ref 36.0–46.0)
Hemoglobin: 13.6 g/dL (ref 12.0–15.0)
MCH: 30.8 pg (ref 26.0–34.0)
MCHC: 34 g/dL (ref 30.0–36.0)
MCV: 90.5 fL (ref 78.0–100.0)
Platelets: 213 10*3/uL (ref 150–400)
RBC: 4.42 MIL/uL (ref 3.87–5.11)
RDW: 13.3 % (ref 11.5–15.5)
WBC: 9.4 10*3/uL (ref 4.0–10.5)

## 2010-07-19 LAB — SURGICAL PCR SCREEN
MRSA, PCR: NEGATIVE
Staphylococcus aureus: POSITIVE — AB

## 2010-07-19 LAB — TYPE AND SCREEN
ABO/RH(D): O POS
Antibody Screen: NEGATIVE

## 2010-07-20 ENCOUNTER — Encounter (INDEPENDENT_AMBULATORY_CARE_PROVIDER_SITE_OTHER): Payer: Self-pay | Admitting: General Surgery

## 2010-07-20 ENCOUNTER — Inpatient Hospital Stay (HOSPITAL_COMMUNITY)
Admission: RE | Admit: 2010-07-20 | Discharge: 2010-08-04 | DRG: 329 | Disposition: A | Discharge status: Home Health | Payer: Medicare Other | Attending: General Surgery | Admitting: General Surgery

## 2010-07-20 DIAGNOSIS — N170 Acute kidney failure with tubular necrosis: Secondary | ICD-10-CM | POA: Diagnosis present

## 2010-07-20 DIAGNOSIS — K219 Gastro-esophageal reflux disease without esophagitis: Secondary | ICD-10-CM | POA: Diagnosis present

## 2010-07-20 DIAGNOSIS — E039 Hypothyroidism, unspecified: Secondary | ICD-10-CM | POA: Diagnosis present

## 2010-07-20 DIAGNOSIS — K5730 Diverticulosis of large intestine without perforation or abscess without bleeding: Principal | ICD-10-CM | POA: Diagnosis present

## 2010-07-20 DIAGNOSIS — I129 Hypertensive chronic kidney disease with stage 1 through stage 4 chronic kidney disease, or unspecified chronic kidney disease: Secondary | ICD-10-CM | POA: Diagnosis present

## 2010-07-20 DIAGNOSIS — N189 Chronic kidney disease, unspecified: Secondary | ICD-10-CM | POA: Diagnosis present

## 2010-07-20 DIAGNOSIS — I2510 Atherosclerotic heart disease of native coronary artery without angina pectoris: Secondary | ICD-10-CM | POA: Diagnosis present

## 2010-07-20 DIAGNOSIS — E872 Acidosis, unspecified: Secondary | ICD-10-CM | POA: Diagnosis present

## 2010-07-20 DIAGNOSIS — E119 Type 2 diabetes mellitus without complications: Secondary | ICD-10-CM | POA: Diagnosis present

## 2010-07-20 DIAGNOSIS — N058 Unspecified nephritic syndrome with other morphologic changes: Secondary | ICD-10-CM | POA: Diagnosis present

## 2010-07-20 IMAGING — CR DG CHEST 2V
2 series · 2 of 2 positions shown · non-contrast
Comparison: [DATE] and [DATE]

CLINICAL DATA: Preoperative respiratory exam.  Diverticulitis.

CHEST - 2 VIEW

[view not recorded (1 of 2)]
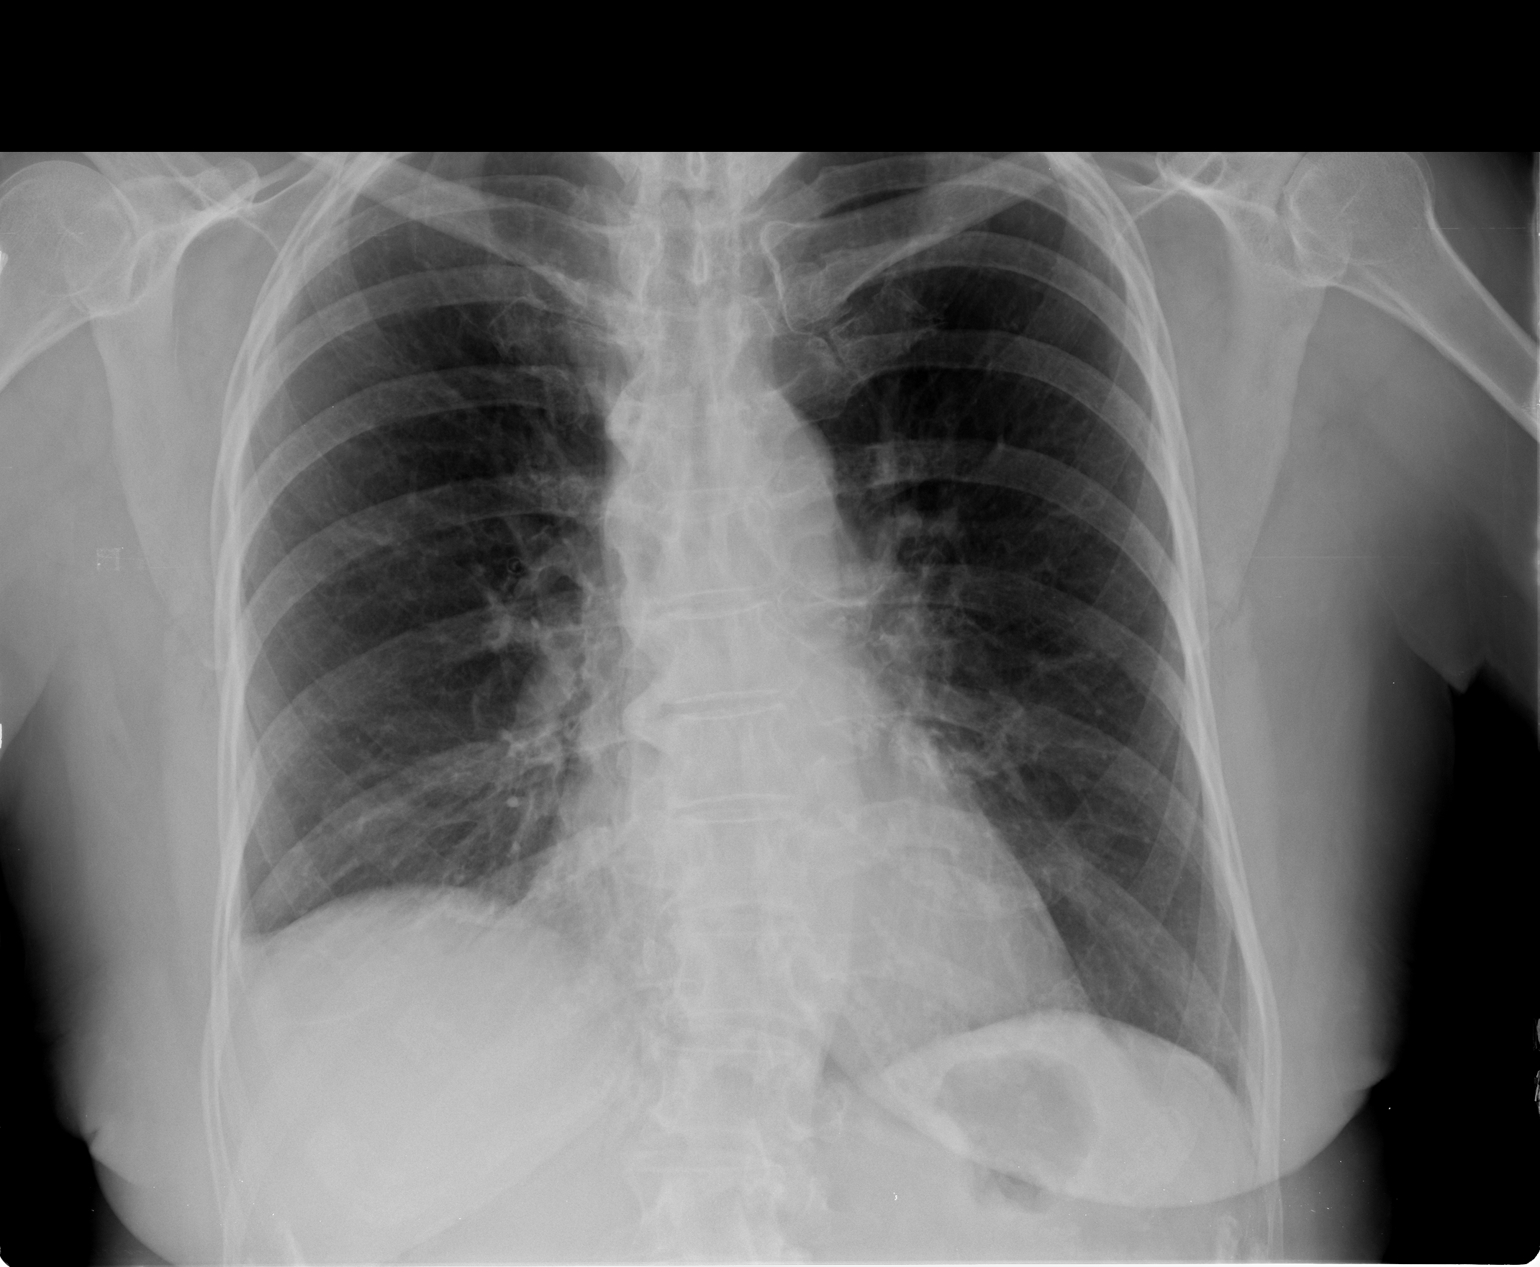

[view not recorded (2 of 2)]
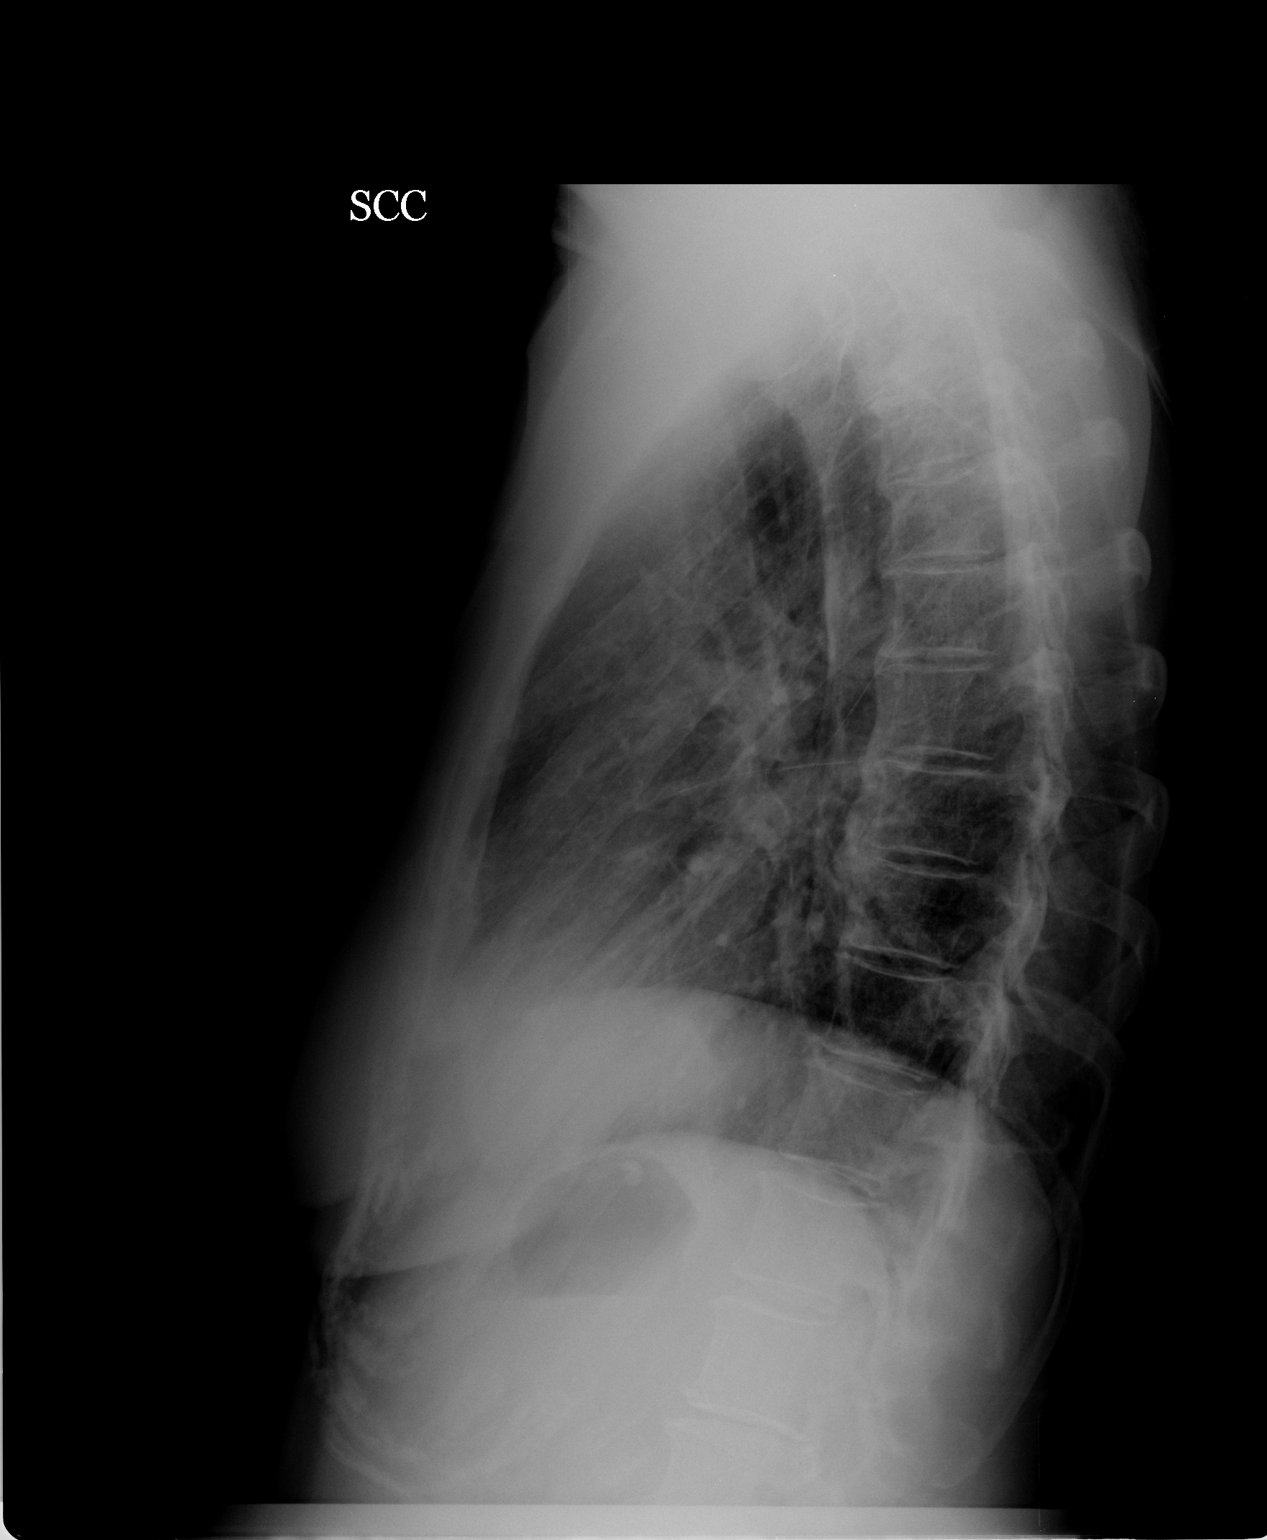

[2 of 2 positions shown; findings below may reference images not displayed]

FINDINGS: The heart size and pulmonary vascularity are normal and
the lungs are clear.  There is slight chronic elevation of right
hemidiaphragm with blunting of the right costophrenic angle
laterally which is felt to be chronic.  No discrete effusions.

No acute osseous abnormality.
IMPRESSION: No acute disease in the chest.

## 2010-07-21 LAB — GLUCOSE, CAPILLARY
Glucose-Capillary: 289 mg/dL — ABNORMAL HIGH (ref 70–99)
Glucose-Capillary: 266 mg/dL — ABNORMAL HIGH (ref 70–99)
Glucose-Capillary: 235 mg/dL — ABNORMAL HIGH (ref 70–99)
Glucose-Capillary: 229 mg/dL — ABNORMAL HIGH (ref 70–99)

## 2010-07-26 LAB — DIFFERENTIAL
Basophils Absolute: 0 10*3/uL (ref 0.0–0.1)
Basophils Relative: 0 % (ref 0–1)
Basophils Relative: 0 % (ref 0–1)
Eosinophils Relative: 0 % (ref 0–5)
Eosinophils Relative: 0 % (ref 0–5)
Lymphocytes Relative: 11 % — ABNORMAL LOW (ref 12–46)
Lymphs Abs: 2.1 10*3/uL (ref 0.7–4.0)
Monocytes Absolute: 1.5 10*3/uL — ABNORMAL HIGH (ref 0.1–1.0)
Monocytes Absolute: 1.2 10*3/uL — ABNORMAL HIGH (ref 0.1–1.0)
Neutro Abs: 12.6 10*3/uL — ABNORMAL HIGH (ref 1.7–7.7)

## 2010-07-26 LAB — BASIC METABOLIC PANEL
CO2: 22 mEq/L (ref 19–32)
Chloride: 110 mEq/L (ref 96–112)
Creatinine, Ser: 2 mg/dL — ABNORMAL HIGH (ref 0.4–1.2)
GFR calc Af Amer: 29 mL/min — ABNORMAL LOW (ref >60)
GFR calc Af Amer: 44 mL/min — ABNORMAL LOW (ref >60)
GFR calc non Af Amer: 36 mL/min — ABNORMAL LOW (ref >60)
Sodium: 138 mEq/L (ref 135–145)

## 2010-07-26 LAB — CBC
MCHC: 31.9 g/dL (ref 30.0–36.0)
MCV: 91.5 fL (ref 78.0–100.0)
MCV: 91.4 fL (ref 78.0–100.0)
RDW: 14.1 % (ref 11.5–15.5)
WBC: 16.2 10*3/uL — ABNORMAL HIGH (ref 4.0–10.5)
WBC: 13.9 10*3/uL — ABNORMAL HIGH (ref 4.0–10.5)

## 2010-07-26 LAB — GLUCOSE, CAPILLARY
Glucose-Capillary: 209 mg/dL — ABNORMAL HIGH (ref 70–99)
Glucose-Capillary: 135 mg/dL — ABNORMAL HIGH (ref 70–99)
Glucose-Capillary: 94 mg/dL (ref 70–99)
Glucose-Capillary: 113 mg/dL — ABNORMAL HIGH (ref 70–99)
Glucose-Capillary: 65 mg/dL — ABNORMAL LOW (ref 70–99)
Glucose-Capillary: 110 mg/dL — ABNORMAL HIGH (ref 70–99)

## 2010-07-26 LAB — HEMOGLOBIN A1C: Mean Plasma Glucose: 157 mg/dL — ABNORMAL HIGH (ref <117)

## 2010-07-27 LAB — GLUCOSE, CAPILLARY
Glucose-Capillary: 137 mg/dL — ABNORMAL HIGH (ref 70–99)
Glucose-Capillary: 70 mg/dL (ref 70–99)
Glucose-Capillary: 76 mg/dL (ref 70–99)
Glucose-Capillary: 83 mg/dL (ref 70–99)
Glucose-Capillary: 87 mg/dL (ref 70–99)
Glucose-Capillary: 124 mg/dL — ABNORMAL HIGH (ref 70–99)
Glucose-Capillary: 129 mg/dL — ABNORMAL HIGH (ref 70–99)
Glucose-Capillary: 145 mg/dL — ABNORMAL HIGH (ref 70–99)
Glucose-Capillary: 144 mg/dL — ABNORMAL HIGH (ref 70–99)

## 2010-07-27 LAB — DIFFERENTIAL
Basophils Absolute: 0 10*3/uL (ref 0.0–0.1)
Eosinophils Absolute: 0.2 10*3/uL (ref 0.0–0.7)
Eosinophils Relative: 2 % (ref 0–5)
Lymphocytes Relative: 17 % (ref 12–46)
Lymphs Abs: 1.5 10*3/uL (ref 0.7–4.0)
Monocytes Absolute: 0.7 10*3/uL (ref 0.1–1.0)

## 2010-07-27 LAB — BASIC METABOLIC PANEL
BUN: 11 mg/dL (ref 6–23)
BUN: 12 mg/dL (ref 6–23)
CO2: 28 mEq/L (ref 19–32)
Calcium: 8.5 mg/dL (ref 8.4–10.5)
Calcium: 8.4 mg/dL (ref 8.4–10.5)
Calcium: 8.6 mg/dL (ref 8.4–10.5)
Creatinine, Ser: 1.39 mg/dL — ABNORMAL HIGH (ref 0.4–1.2)
GFR calc non Af Amer: 44 mL/min — ABNORMAL LOW (ref >60)
GFR calc non Af Amer: 36 mL/min — ABNORMAL LOW (ref >60)
Glucose, Bld: 72 mg/dL (ref 70–99)
Glucose, Bld: 181 mg/dL — ABNORMAL HIGH (ref 70–99)
Glucose, Bld: 156 mg/dL — ABNORMAL HIGH (ref 70–99)
Sodium: 139 mEq/L (ref 135–145)
Sodium: 139 mEq/L (ref 135–145)

## 2010-07-27 LAB — CBC
HCT: 29.8 % — ABNORMAL LOW (ref 36.0–46.0)
HCT: 30.7 % — ABNORMAL LOW (ref 36.0–46.0)
Hemoglobin: 10.1 g/dL — ABNORMAL LOW (ref 12.0–15.0)
MCHC: 32.9 g/dL (ref 30.0–36.0)
MCHC: 32.9 g/dL (ref 30.0–36.0)
MCV: 89.8 fL (ref 78.0–100.0)
MCV: 89.8 fL (ref 78.0–100.0)
Platelets: 230 10*3/uL (ref 150–400)
RDW: 13.5 % (ref 11.5–15.5)
WBC: 8.8 10*3/uL (ref 4.0–10.5)

## 2010-07-28 LAB — BASIC METABOLIC PANEL
CO2: 25 mEq/L (ref 19–32)
Calcium: 8.6 mg/dL (ref 8.4–10.5)
Creatinine, Ser: 2.43 mg/dL — ABNORMAL HIGH (ref 0.4–1.2)
GFR calc Af Amer: 23 mL/min — ABNORMAL LOW (ref >60)
GFR calc non Af Amer: 19 mL/min — ABNORMAL LOW (ref >60)
Sodium: 137 mEq/L (ref 135–145)

## 2010-07-28 LAB — CBC
Hemoglobin: 10.4 g/dL — ABNORMAL LOW (ref 12.0–15.0)
Hemoglobin: 9.8 g/dL — ABNORMAL LOW (ref 12.0–15.0)
MCH: 29.1 pg (ref 26.0–34.0)
MCHC: 32.8 g/dL (ref 30.0–36.0)
MCV: 88.7 fL (ref 78.0–100.0)
Platelets: 247 10*3/uL (ref 150–400)
RBC: 3.45 MIL/uL — ABNORMAL LOW (ref 3.87–5.11)
RBC: 3.37 MIL/uL — ABNORMAL LOW (ref 3.87–5.11)
WBC: 15.8 10*3/uL — ABNORMAL HIGH (ref 4.0–10.5)

## 2010-07-28 LAB — MAGNESIUM: Magnesium: 1.9 mg/dL (ref 1.5–2.5)

## 2010-07-28 LAB — GLUCOSE, CAPILLARY
Glucose-Capillary: 191 mg/dL — ABNORMAL HIGH (ref 70–99)
Glucose-Capillary: 144 mg/dL — ABNORMAL HIGH (ref 70–99)
Glucose-Capillary: 128 mg/dL — ABNORMAL HIGH (ref 70–99)

## 2010-07-28 LAB — POTASSIUM
Potassium: 3.4 mEq/L — ABNORMAL LOW (ref 3.5–5.1)
Potassium: 3.6 mEq/L (ref 3.5–5.1)

## 2010-07-28 LAB — URINALYSIS, ROUTINE W REFLEX MICROSCOPIC
Ketones, ur: NEGATIVE mg/dL
Nitrite: NEGATIVE
Urine Glucose, Fasting: NEGATIVE mg/dL
pH: 6 (ref 5.0–8.0)

## 2010-07-28 LAB — CREATININE, SERUM
Creatinine, Ser: 1.29 mg/dL — ABNORMAL HIGH (ref 0.4–1.2)
GFR calc Af Amer: 48 mL/min — ABNORMAL LOW (ref >60)
GFR calc non Af Amer: 40 mL/min — ABNORMAL LOW (ref >60)
GFR calc non Af Amer: 20 mL/min — ABNORMAL LOW (ref >60)

## 2010-07-28 LAB — URINE MICROSCOPIC-ADD ON

## 2010-07-28 LAB — ALBUMIN: Albumin: 2.6 g/dL — ABNORMAL LOW (ref 3.5–5.2)

## 2010-07-29 LAB — CREATININE, URINE, RANDOM: Creatinine, Urine: 47.6 mg/dL

## 2010-07-29 LAB — GLUCOSE, CAPILLARY
Glucose-Capillary: 71 mg/dL (ref 70–99)
Glucose-Capillary: 77 mg/dL (ref 70–99)
Glucose-Capillary: 125 mg/dL — ABNORMAL HIGH (ref 70–99)

## 2010-07-29 LAB — CBC
MCH: 29.7 pg (ref 26.0–34.0)
MCHC: 33 g/dL (ref 30.0–36.0)
Platelets: 277 10*3/uL (ref 150–400)
RDW: 14.3 % (ref 11.5–15.5)

## 2010-07-29 LAB — BASIC METABOLIC PANEL
Calcium: 8.9 mg/dL (ref 8.4–10.5)
Creatinine, Ser: 2.98 mg/dL — ABNORMAL HIGH (ref 0.4–1.2)
GFR calc Af Amer: 18 mL/min — ABNORMAL LOW (ref >60)

## 2010-07-29 LAB — URINE CULTURE

## 2010-07-30 LAB — GLUCOSE, CAPILLARY
Glucose-Capillary: 103 mg/dL — ABNORMAL HIGH (ref 70–99)
Glucose-Capillary: 52 mg/dL — ABNORMAL LOW (ref 70–99)
Glucose-Capillary: 63 mg/dL — ABNORMAL LOW (ref 70–99)
Glucose-Capillary: 85 mg/dL (ref 70–99)

## 2010-07-30 LAB — POTASSIUM: Potassium: 3.4 mEq/L — ABNORMAL LOW (ref 3.5–5.1)

## 2010-07-30 LAB — CBC
HCT: 27 % — ABNORMAL LOW (ref 36.0–46.0)
Hemoglobin: 9 g/dL — ABNORMAL LOW (ref 12.0–15.0)
MCH: 29.2 pg (ref 26.0–34.0)
MCHC: 32.8 g/dL (ref 30.0–36.0)
MCV: 88.8 fL (ref 78.0–100.0)
Platelets: 309 10*3/uL (ref 150–400)
RDW: 14.3 % (ref 11.5–15.5)
RDW: 14.2 % (ref 11.5–15.5)
WBC: 19.3 10*3/uL — ABNORMAL HIGH (ref 4.0–10.5)
WBC: 16.8 10*3/uL — ABNORMAL HIGH (ref 4.0–10.5)

## 2010-07-30 LAB — CREATININE, SERUM: GFR calc Af Amer: 14 mL/min — ABNORMAL LOW (ref >60)

## 2010-07-30 LAB — URINALYSIS, ROUTINE W REFLEX MICROSCOPIC
Nitrite: NEGATIVE
Specific Gravity, Urine: 1.007 (ref 1.005–1.030)
Urine Glucose, Fasting: NEGATIVE mg/dL
pH: 7 (ref 5.0–8.0)

## 2010-07-30 LAB — DIFFERENTIAL
Eosinophils Absolute: 0.3 10*3/uL (ref 0.0–0.7)
Eosinophils Relative: 2 % (ref 0–5)
Lymphs Abs: 1.6 10*3/uL (ref 0.7–4.0)
Monocytes Absolute: 1.1 10*3/uL — ABNORMAL HIGH (ref 0.1–1.0)
Monocytes Relative: 7 % (ref 3–12)

## 2010-07-30 LAB — URINE MICROSCOPIC-ADD ON

## 2010-07-30 IMAGING — US US RENAL
1 series · 14 of 25 positions shown · non-contrast
Comparison: None.

CLINICAL DATA: Elevated BUN.

RENAL/URINARY TRACT ULTRASOUND COMPLETE

[Series 1: us renal · 0.28mm/px · 14 of 26 slices shown]
[im 1/26]
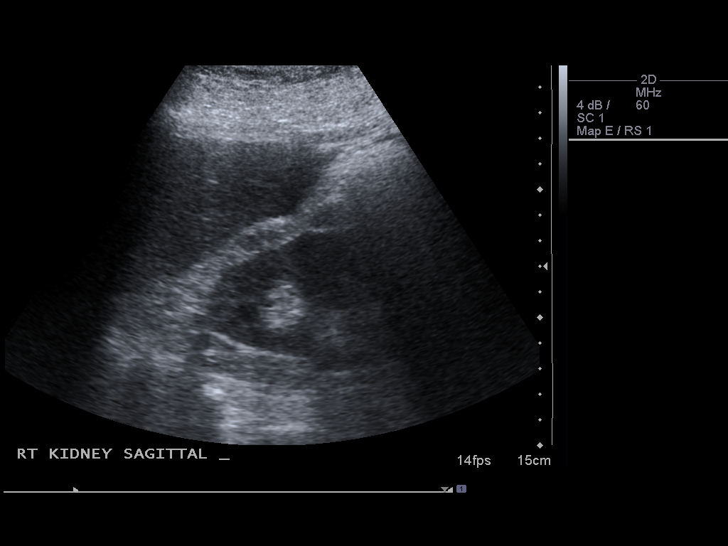
[im 3/26]
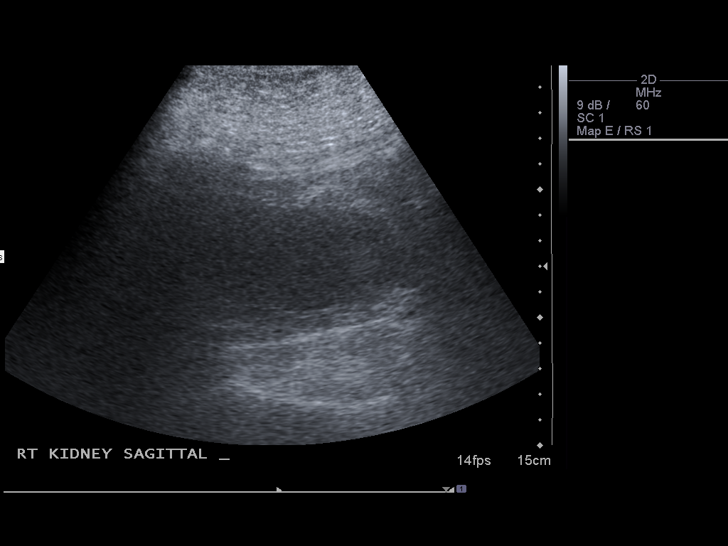
[im 5/26]
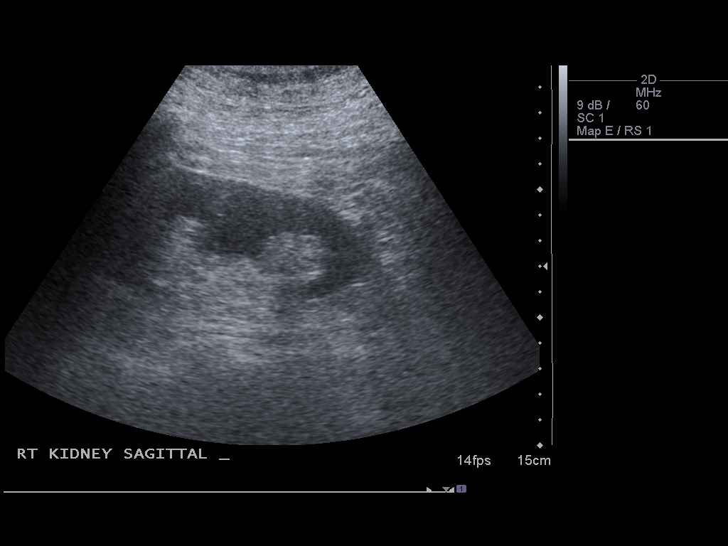
[im 7/26]
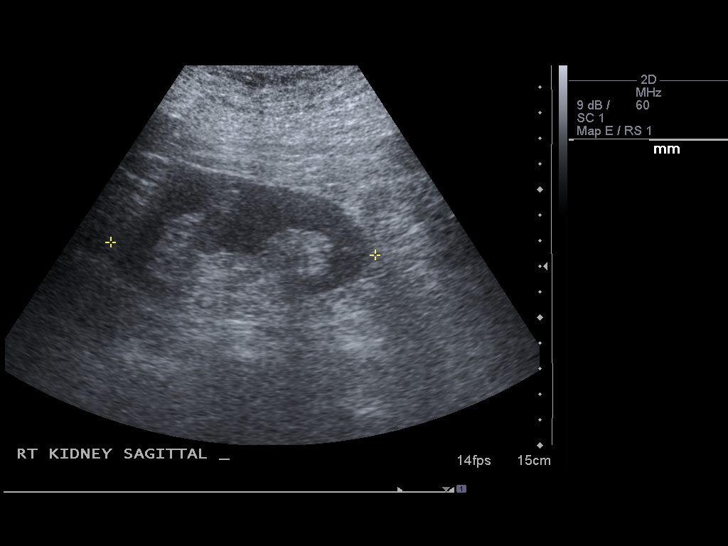
[im 9/26]
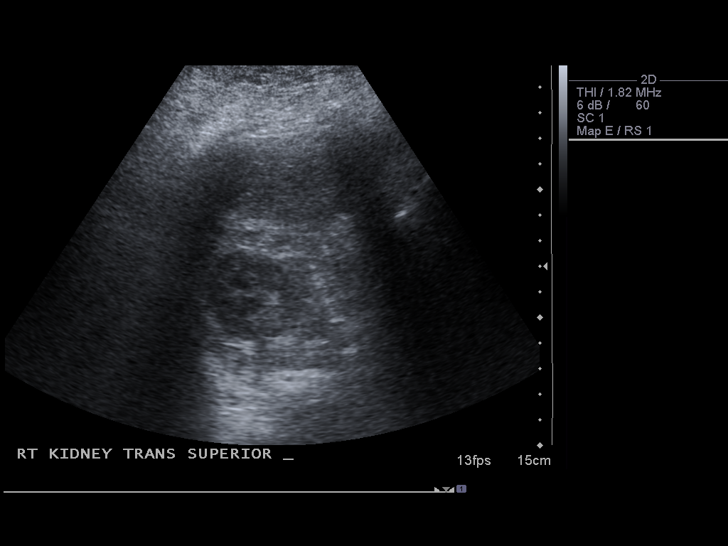
[im 10/26]
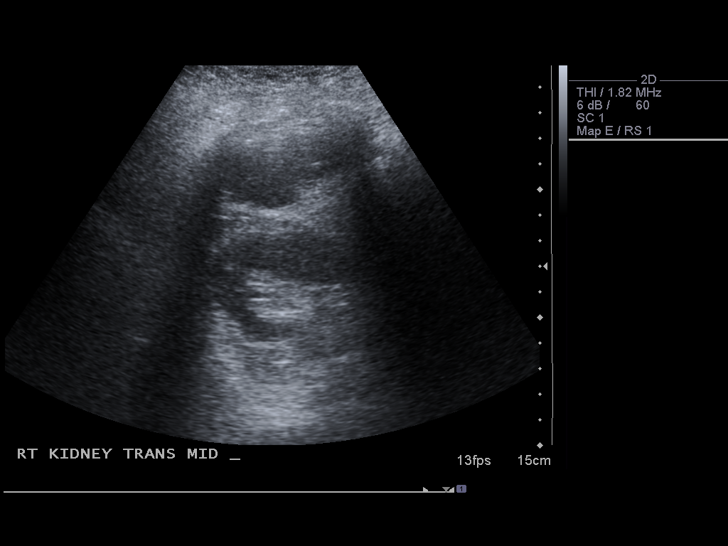
[im 12/26]
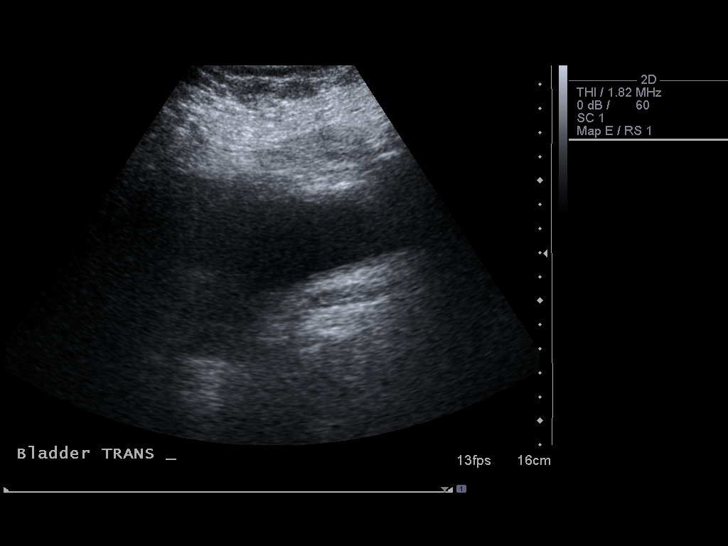
[im 14/26]
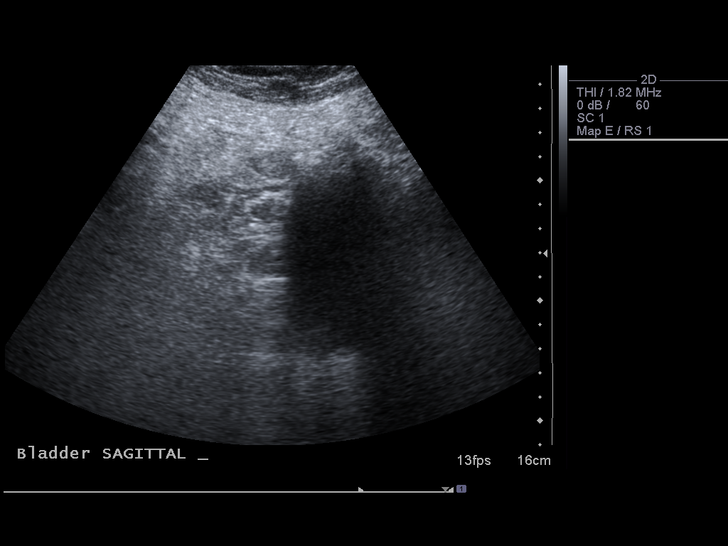
[im 16/26]
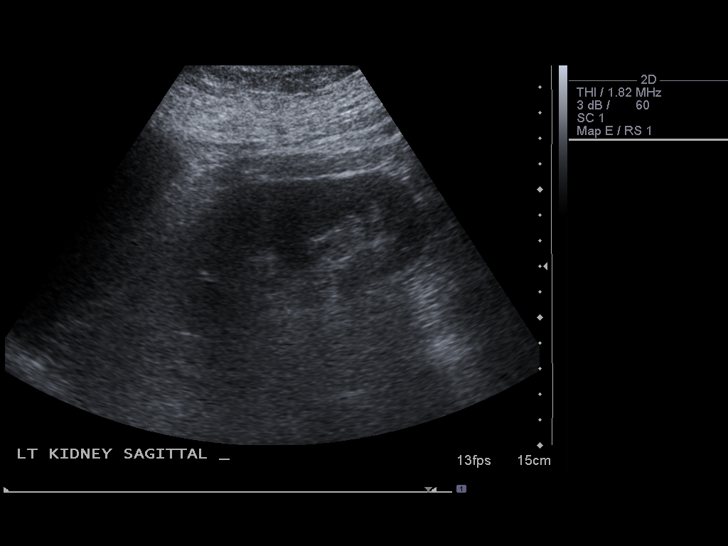
[im 17/26]
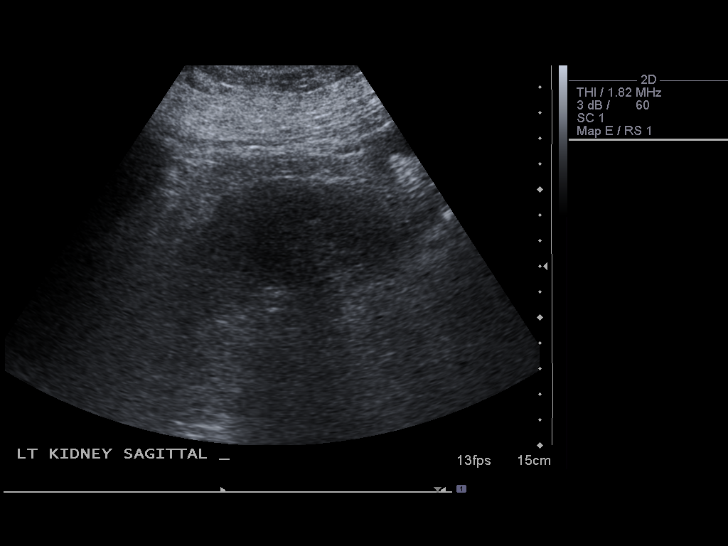
[im 19/26]
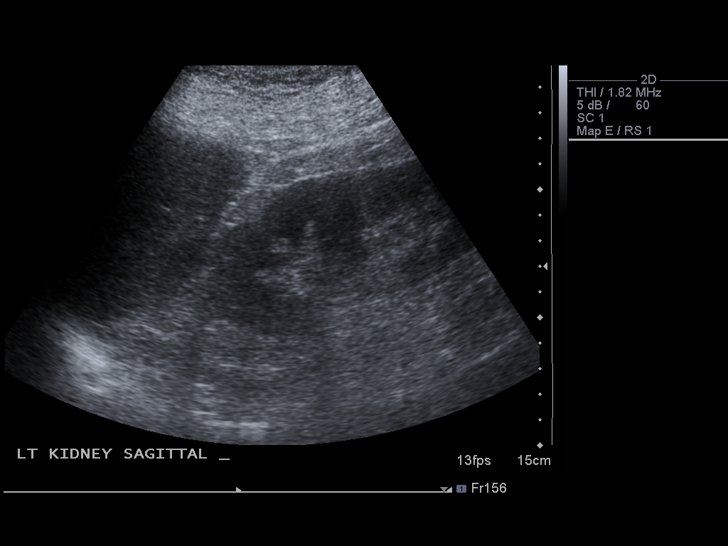
[im 21/26]
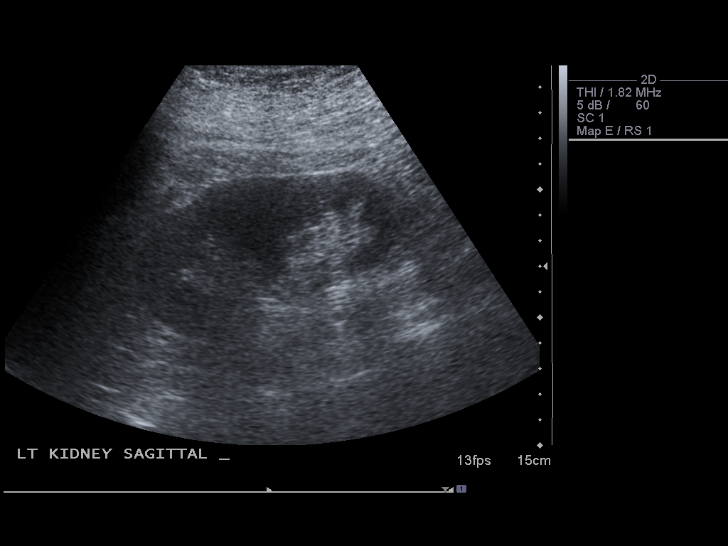
[im 23/26]
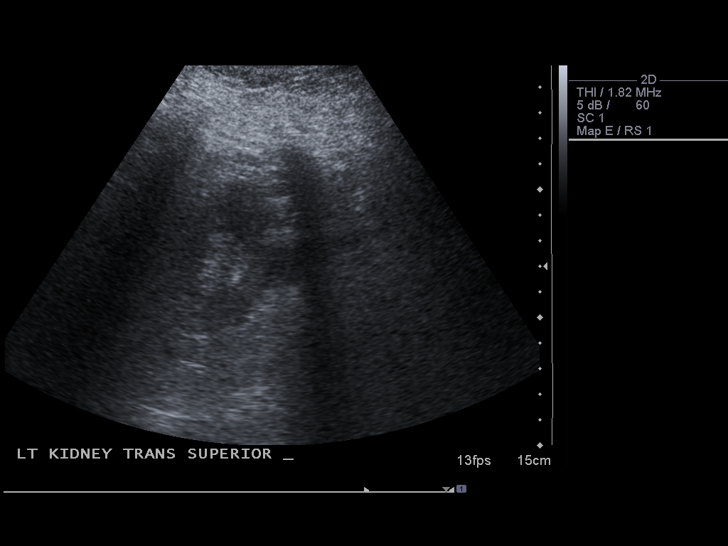
[im 26/26]
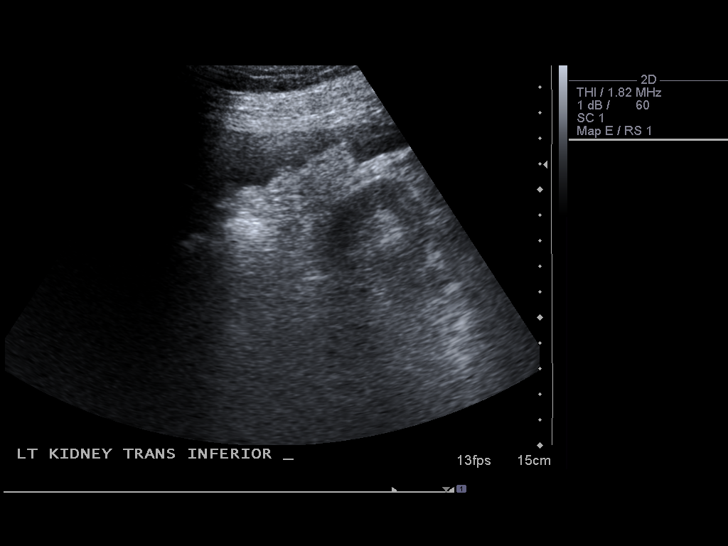

[14 of 25 positions shown; findings below may reference images not displayed]

FINDINGS: Right Kidney:  Measures 10.4 cm and appears normal.

Left Kidney:  Measures 10.8 cm and appears normal.

Bladder:  Appears normal. Scattered ascites noted.
IMPRESSION: No hydronephrosis.  Ascites.

## 2010-07-30 NOTE — Op Note (Signed)
Catherine Mason, Catherine Mason NO.:  [id number]  MEDICAL RECORD NO.:  [id number]          PATIENT TYPE:  INP  LOCATION:  5123                         FACILITY:  [facility]  PHYSICIAN:  Cherylynn Ridges, M.D.    DATE OF BIRTH:  September 05, 1927  DATE OF PROCEDURE:  07/20/2010 DATE OF DISCHARGE:                              OPERATIVE REPORT   PREOPERATIVE DIAGNOSIS:  Diverticulosis of the descending colon and splenic flexure with a functional colostomy status post Hartmann procedure for perforated diverticulitis.  POSTOPERATIVE DIAGNOSIS:  Diverticulosis of the descending colon and splenic flexure with a functional colostomy status post Hartmann procedure for perforated diverticulitis.  PROCEDURE: 1. Left colectomy. 2. Reversal of colostomy.  SURGEON:  Marta Lamas. Lindie Spruce, MD  ASSISTANT:  Ardeth Sportsman, MD  ANESTHESIA:  General endotracheal.  ESTIMATED BLOOD LOSS:  100 mL.  COMPLICATIONS:  None.  CONDITION:  Stable.  FINDINGS:  Lot of adhesions of the small bowel, normal-functioning colostomy, and a high splenic flexure of the descending and transverse colon.  INDICATIONS FOR OPERATION:  The patient back in May 2011 perforated sigmoid colon leading to peritonitis and fecal contamination.  She had a very stormy course back then.  After several weeks in the hospital rehab and then subsequent outpatient follow up, she is doing well.  Postop an outpatient barium study demonstrated a good segment of sigmoid colon distally and significant diverticular disease up to the splenic flexure. Colectomy and takedown colostomy was planned.  OPERATION:  The patient was taken to the operating room and placed on table in supine position.  After an adequate general endotracheal anesthetic was administered, the stoma site was closed using running locking stitch of 0 silk suture.  She was prepped and draped in the usual sterile manner using Betadine.  After proper time-out was done  identifying the patient and procedure to be performed, we made our initial incision with a #10 blade excising around the patient's previous widened scar in the midline which granulated in over a long period of time.  We dissected down it down into the peritoneal cavity taking care not to injure any bowel in the process using electrocautery, Metzenbaum, and knives to do so.  Once we were in the free peritoneal cavity, we obtained hemostasis with electrocautery.  There were dense adhesions to the small bowel throughout.  We were able to eventually dissect out and do an adhesiolysis all the way up to the ligament of Treitz and the terminal ileum.  We freed up the bowel completely.  In the pelvis, the sutures of the Prolene attached to the end segment of sigmoid colon was noted.  We able to mobilize this very small amount with a Babcock, but we did not have to mobilize as much as we were to bring down the transverse colon to that area and for the anastomosis.  Once we had identified the distal stump, we took down the colostomy by excising around it with a #10 blade, then down into the subcu with electrocautery, detaching it from the fascia internally and externally. We then mobilized it up to the splenic flexure where we took  it down from its peritoneal attachments laterally.  There was colosplenic ligament which we took down with a cauterize cutting device.  We took down the splenic flexure to the left transverse colon and mobilized the colon and dissected the omentum off of the colon as we got to the mid left transverse colon.  We came across the colon at this point with a GIA 75 stapler.  The mesentery between was taken down using 2-0 silk ties and also the LigaSure device.  Once we had completed this removal, we sent the specimen.  We mobilized the transverse colon well enough bringing down to the pelvis.  We placed the patient in Trendelenburg, packed small bowel off and then we did a  hand-sewn single layer 2-0 silk full-thickness anastomosis between the end segment and the transverse colon.  Multiple stitches were used.  We started off by attaching the back wall.  We had minimal spillage during the processes and used the spring clamp on the bowel prior to anastomosing and cutting off the stapled end.  We cut off the end of the distal segment and the anastomosis.  Once this was done, we tacked down the mesentery to the retroperitoneum with 3 interrupted simple stitches of 3-0 silk without any significant tethering of bowel.  There was minimal to no tension on the anastomosis.  We irrigated with saline solution, cauterized the peritoneal edges superiorly in order control bleeding there.  The spleen was fine and had no evidence of injury.  Once we controlled the bleeding, we changed our gloves, irrigated again with saline, and then closed.  The stoma site was closed internally and externally with interrupted figure-of-eight stitches of #1 Vicryl.  The midline fascia was closed using running looped #1 PDS suture.  We irrigated with saline and closed the skin with stainless steel staples as we did the stomal site.  All needle counts, sponge counts, and instrument counts were correct.     Cherylynn Ridges, M.D.     JOW/MEDQ  D:  07/20/2010  T:  07/21/2010  Job:  [id number]  cc:   Jeannett Senior A. Evlyn Kanner, M.D.  Electronically Signed by Jimmye Norman M.D. on 07/28/2010 08:16:57 AM

## 2010-07-31 LAB — GLUCOSE, CAPILLARY
Glucose-Capillary: 83 mg/dL (ref 70–99)
Glucose-Capillary: 94 mg/dL (ref 70–99)
Glucose-Capillary: 434 mg/dL — ABNORMAL HIGH (ref 70–99)

## 2010-07-31 LAB — COMPREHENSIVE METABOLIC PANEL
ALT: 50 U/L — ABNORMAL HIGH (ref 0–35)
Albumin: 2.3 g/dL — ABNORMAL LOW (ref 3.5–5.2)
Alkaline Phosphatase: 108 U/L (ref 39–117)
BUN: 45 mg/dL — ABNORMAL HIGH (ref 6–23)
Chloride: 105 mEq/L (ref 96–112)
Glucose, Bld: 97 mg/dL (ref 70–99)
Potassium: 3.4 mEq/L — ABNORMAL LOW (ref 3.5–5.1)
Sodium: 136 mEq/L (ref 135–145)
Total Bilirubin: 0.5 mg/dL (ref 0.3–1.2)

## 2010-07-31 LAB — CBC
HCT: 26 % — ABNORMAL LOW (ref 36.0–46.0)
MCV: 88.1 fL (ref 78.0–100.0)
RBC: 2.95 MIL/uL — ABNORMAL LOW (ref 3.87–5.11)
WBC: 14 10*3/uL — ABNORMAL HIGH (ref 4.0–10.5)

## 2010-07-31 LAB — DIFFERENTIAL
Basophils Absolute: 0 10*3/uL (ref 0.0–0.1)
Lymphocytes Relative: 12 % (ref 12–46)
Lymphs Abs: 1.7 10*3/uL (ref 0.7–4.0)
Neutro Abs: 11 10*3/uL — ABNORMAL HIGH (ref 1.7–7.7)
Neutrophils Relative %: 78 % — ABNORMAL HIGH (ref 43–77)

## 2010-08-01 LAB — COMPREHENSIVE METABOLIC PANEL
ALT: 47 U/L — ABNORMAL HIGH (ref 0–35)
AST: 32 U/L (ref 0–37)
Alkaline Phosphatase: 123 U/L — ABNORMAL HIGH (ref 39–117)
CO2: 19 mEq/L (ref 19–32)
Calcium: 8.8 mg/dL (ref 8.4–10.5)
Chloride: 102 mEq/L (ref 96–112)
GFR calc Af Amer: 10 mL/min — ABNORMAL LOW (ref >60)
GFR calc non Af Amer: 8 mL/min — ABNORMAL LOW (ref >60)
Glucose, Bld: 334 mg/dL — ABNORMAL HIGH (ref 70–99)
Potassium: 4.2 mEq/L (ref 3.5–5.1)
Sodium: 135 mEq/L (ref 135–145)

## 2010-08-01 LAB — CBC
HCT: 26.4 % — ABNORMAL LOW (ref 36.0–46.0)
Hemoglobin: 8.6 g/dL — ABNORMAL LOW (ref 12.0–15.0)
MCHC: 32.6 g/dL (ref 30.0–36.0)
RBC: 3 MIL/uL — ABNORMAL LOW (ref 3.87–5.11)
WBC: 14.3 10*3/uL — ABNORMAL HIGH (ref 4.0–10.5)

## 2010-08-01 LAB — GLUCOSE, CAPILLARY
Glucose-Capillary: 302 mg/dL — ABNORMAL HIGH (ref 70–99)
Glucose-Capillary: 302 mg/dL — ABNORMAL HIGH (ref 70–99)
Glucose-Capillary: 388 mg/dL — ABNORMAL HIGH (ref 70–99)
Glucose-Capillary: 528 mg/dL — ABNORMAL HIGH (ref 70–99)

## 2010-08-01 LAB — DIFFERENTIAL
Basophils Absolute: 0 10*3/uL (ref 0.0–0.1)
Basophils Relative: 0 % (ref 0–1)
Lymphocytes Relative: 10 % — ABNORMAL LOW (ref 12–46)
Monocytes Absolute: 0.8 10*3/uL (ref 0.1–1.0)
Neutro Abs: 12.1 10*3/uL — ABNORMAL HIGH (ref 1.7–7.7)
Neutrophils Relative %: 85 % — ABNORMAL HIGH (ref 43–77)

## 2010-08-01 LAB — PHOSPHORUS: Phosphorus: 3.4 mg/dL (ref 2.3–4.6)

## 2010-08-02 LAB — CBC
HCT: 27.6 % — ABNORMAL LOW (ref 36.0–46.0)
Hemoglobin: 9.3 g/dL — ABNORMAL LOW (ref 12.0–15.0)
MCH: 29.2 pg (ref 26.0–34.0)
MCHC: 33.7 g/dL (ref 30.0–36.0)
MCV: 86.8 fL (ref 78.0–100.0)
Platelets: 508 K/uL — ABNORMAL HIGH (ref 150–400)
RBC: 3.18 MIL/uL — ABNORMAL LOW (ref 3.87–5.11)
RDW: 14.2 % (ref 11.5–15.5)
WBC: 19.5 K/uL — ABNORMAL HIGH (ref 4.0–10.5)

## 2010-08-02 LAB — GLUCOSE, CAPILLARY
Glucose-Capillary: 126 mg/dL — ABNORMAL HIGH (ref 70–99)
Glucose-Capillary: 160 mg/dL — ABNORMAL HIGH (ref 70–99)

## 2010-08-02 LAB — PTH, INTACT AND CALCIUM: Calcium, Total (PTH): 8.6 mg/dL (ref 8.4–10.5)

## 2010-08-02 LAB — DIFFERENTIAL
Basophils Absolute: 0 10*3/uL (ref 0.0–0.1)
Basophils Relative: 0 % (ref 0–1)
Eosinophils Absolute: 0 10*3/uL (ref 0.0–0.7)
Monocytes Absolute: 0.9 10*3/uL (ref 0.1–1.0)
Monocytes Relative: 5 % (ref 3–12)
Neutro Abs: 16.5 10*3/uL — ABNORMAL HIGH (ref 1.7–7.7)
Neutrophils Relative %: 86 % — ABNORMAL HIGH (ref 43–77)

## 2010-08-02 LAB — COMPREHENSIVE METABOLIC PANEL
ALT: 52 U/L — ABNORMAL HIGH (ref 0–35)
AST: 43 U/L — ABNORMAL HIGH (ref 0–37)
Albumin: 2.6 g/dL — ABNORMAL LOW (ref 3.5–5.2)
Calcium: 9.1 mg/dL (ref 8.4–10.5)
Creatinine, Ser: 4.64 mg/dL — ABNORMAL HIGH (ref 0.4–1.2)
GFR calc Af Amer: 11 mL/min — ABNORMAL LOW (ref >60)
Sodium: 140 mEq/L (ref 135–145)
Total Protein: 6.8 g/dL (ref 6.0–8.3)

## 2010-08-02 NOTE — Consult Note (Signed)
Catherine Mason, Catherine Mason               ACCOUNT NO.:  [id number]  MEDICAL RECORD NO.:  [id number]          PATIENT TYPE:  INP  LOCATION:  5123                         FACILITY:  [facility]  PHYSICIAN:  Rylie Knierim L. Angelamarie Avakian, M.D.DATE OF BIRTH:  1928-03-29  DATE OF CONSULTATION: DATE OF DISCHARGE:                                CONSULTATION   CONSULTING PHYSICIAN:  Marta Lamas. Lindie Spruce, MD  RECENT FOR CONSULT: 1. Acute kidney injury. 2. Chronic kidney disease.  HISTORY OF PRESENT ILLNESS:  This is an 77 year old female with history of hypertension for number of years, history of diabetes, she says just about a year or so, but had only been on __________ at a longer period, history of endometrial carcinoma status post resection in April 2011, history of diverticulosis with perforation in May 11 with septic shock, had acute renal failure, creatinine up to 3.  History of anemia, hypothyroidism.  She is now admitted on January 17 for elective takedown of a colostomy and hemicolectomy on the left and was done on January 17. Postop her creatinine which was 1.6 went up to 2.  Subsequently with lots of fluid, went down to 1.19 from 1.39.  On January 25, her creatinine rose to 2.34, then 2.43, same day it was 2.98 on January 26 and 3.66 today.  She is given ramipril a few days ago as well as Naprosyn for several doses.  Other meds include multivitamins, psyllium, metoprolol, Synthroid, iron, cholecalciferol, and Cipro.  She has no family history of renal disease, no history of UTIs.  She does not know if any history of renal disease.  GFR, however, has been about 30-40 as an outpatient.  She has no rash, arthritis, arthralgias.  No headache, visual difficulties, sore throat, dry eyes, dry mouth, muscle aches, pains.  No internal blood in her stool.  She is having about 4-8 bowel movements a day at the current time.  She has an allergy to [facility].  Past medical history also includes coronary  artery disease status post 2 stents in 2003, history of hypertension, hyperlipidemia, uterine cancer status post hysterectomy, history of hypothyroidism.  SOCIAL HISTORY:  Nondrinker, nonsmoker, retired, has a grandson and a daughter.  No children on her own.  Has a step grandson and step granddaughter.  She has a sister who has hypertension and diabetes. Father died in his 42s of cardiac disease.  Mother died at 63 of cardiac disease.  REVIEW OF SYSTEMS:  HEENT:  Needs new glasses.  She has visual difficulties, otherwise unremarkable.  No history of rheumatic fever or scarlet fever.  CARDIOVASCULAR:  Sleeps on 2 pillows.  No PND.  She has nocturia x3.  No dysuria or hematuria at the current time.  She had some trouble controlling her urine after a surgery last year, but apparently can control at this time.  GI:  As listed above.  No indigestion, heartburn, history of hepatitis or jaundice.  SKIN:  Unremarkable. MUSCULOSKELETAL:  Unremarkable.  NEUROLOGIC:  Unremarkable.  She is somewhat week.  OBJECTIVE:  Physical exam, no acute distress.  Blood pressure 139/53, 96% on room air sat, 98.3 temperature.  LABORATORY  DATA:  Glucose today is 63 to 125, hemoglobin 9, white count of 19,300, platelets 322,000.  Today; creatinine 3.6, BUN 35. Yesterday; her creatinine was 2.98.  On January 25, it was 2.34 and 2.43 respectively; on January 24, 1.25.  Urinalysis on January 24, too numerous to count white cells and culture has been negative.  Is and Os on January 25, 650 in, 1150 out; July 27, 1430 in, 2450 out; July 26 1780 in, 600 out; July 25 1678 in, 3500 out; July 24 3494 in, 1154 out; July 23, 2178 in, 3700 out.  PHYSICAL EXAMINATION:  GENERAL:  She is pale. VITAL SIGNS:  Blood pressure supine 140/62, 130/52 by my exam. HEENT:  Disks and retinal vessels unremarkable.  Pharynx unremarkable. NECK:  Without mass or thyromegaly. CARDIOVASCULAR:  Regular rhythm, S4, grade  1-2/6 systolic ejection murmur, best heard at the left sternal border.  PMI is 10 cm lateral to the midsternal fifth intercostal space.  Soft left ventricular lift. She has 1+ edema.  Pulses are 2+ and 4+.  No bruits are noted. LUNGS:  No rales, rhonchi, or wheezes.  Slight decreased expansion. Normal resonance to percussion, normal expansion. ABDOMEN:  Active bowel sounds, soft, large midline incision.  Also an incision in her left mid abdomen where her ostomy was.  Liver is down to 3-4 cm. SKIN:  Somewhat pale, seborrheic keratosis, few moles. LYMPH NODES:  No significant adenopathy in axilla or supraclavicular, or posterior cervical adenopathy.  ASSESSMENT: 1. Chronic kidney disease, stage III, suspect this is hypertension     with some residual of acute kidney injury causing this. 2. Acute kidney injury, sterile pyuria in the setting of rapid     increase of creatinine, consistent with allergic interstitial     disease, rule out Naprosyn as primary etiology.  Also she was given     ramipril in the setting of slight decreased volume, which would     compare renal blood flow and can cause acute tubular necrosis.     Need to rule out obstruction as the cause of acute interstitial     nephritis. 3. Hypertension. 4. Diverticular disease status post hemicolectomy takedown. 5. Diabetes mellitus. 6. Anemia.  We will check iron studies.  PLAN:  As above.  I will check an ultrasound.  Repeat urinalysis, urine sodium and creatinine, discontinue the Cipro, avoid nonsteroidals, ACE inhibitors, PTH, and iron TIBC.  We will need to consider steroids in the setting.          ______________________________ Llana Aliment. Marinda Tyer, M.D.     JLD/MEDQ  D:  07/30/2010  T:  07/31/2010  Job:  [id number]  Electronically Signed by Beryle Lathe M.D. on 08/02/2010 06:42:43 PM

## 2010-08-03 LAB — BASIC METABOLIC PANEL
BUN: 70 mg/dL — ABNORMAL HIGH (ref 6–23)
CO2: 23 mEq/L (ref 19–32)
Chloride: 103 mEq/L (ref 96–112)
Creatinine, Ser: 3.99 mg/dL — ABNORMAL HIGH (ref 0.4–1.2)
Glucose, Bld: 173 mg/dL — ABNORMAL HIGH (ref 70–99)
Potassium: 3.9 mEq/L (ref 3.5–5.1)

## 2010-08-03 LAB — CBC
HCT: 25.9 % — ABNORMAL LOW (ref 36.0–46.0)
Hemoglobin: 8.6 g/dL — ABNORMAL LOW (ref 12.0–15.0)
MCH: 29.2 pg (ref 26.0–34.0)
MCV: 87.8 fL (ref 78.0–100.0)
RBC: 2.95 MIL/uL — ABNORMAL LOW (ref 3.87–5.11)
WBC: 13 10*3/uL — ABNORMAL HIGH (ref 4.0–10.5)

## 2010-08-03 LAB — DIFFERENTIAL
Eosinophils Absolute: 0 10*3/uL (ref 0.0–0.7)
Lymphocytes Relative: 13 % (ref 12–46)
Lymphs Abs: 1.7 10*3/uL (ref 0.7–4.0)
Monocytes Relative: 9 % (ref 3–12)
Neutro Abs: 10.2 10*3/uL — ABNORMAL HIGH (ref 1.7–7.7)
Neutrophils Relative %: 79 % — ABNORMAL HIGH (ref 43–77)

## 2010-08-03 LAB — GLUCOSE, CAPILLARY
Glucose-Capillary: 129 mg/dL — ABNORMAL HIGH (ref 70–99)
Glucose-Capillary: 319 mg/dL — ABNORMAL HIGH (ref 70–99)
Glucose-Capillary: 294 mg/dL — ABNORMAL HIGH (ref 70–99)

## 2010-08-03 NOTE — Progress Notes (Signed)
Summary: Nuclear Pre-Procedure  Phone Note Outgoing Call   Call placed by: Milana Na, EMT-P,  May 20, 2010 3:41 PM Summary of Call: Reviewed information on [employer] Information Sheet (see scanned document for further details).  Spoke with patiient.     Nuclear Med Background Indications for Stress Test: Evaluation for Ischemia, Surgical Clearance, Stent Patency  Indications Comments: Pending surgery  History: Heart Catheterization, Myocardial Perfusion Study, Stents  History Comments: SVT 2003 Heart Catherization--Stents 2007 MPS EF 80% NL  Symptoms: Dizziness, Fatigue    Nuclear Pre-Procedure Cardiac Risk Factors: Family History - CAD, Hypertension, Lipids, NIDDM  Nuclear Med Study Referring MD:  P.Jordan

## 2010-08-03 NOTE — Assessment & Plan Note (Signed)
Summary: Cardiology Nuclear Testing  Nuclear Med Background Indications for Stress Test: Evaluation for Ischemia, Surgical Clearance, Stent Patency  Indications Comments: Pending surgery  History: Heart Catheterization, Myocardial Perfusion Study, Stents  History Comments: SVT 2003 Heart Catherization--Stents 2007 MPS EF 80% NL  Symptoms: Dizziness, Fatigue    Nuclear Pre-Procedure Cardiac Risk Factors: Family History - CAD, Hypertension, Lipids, NIDDM Caffeine/Decaff Intake: none NPO After: 6:00 PM Lungs: clear IV 0.9% NS with Angio Cath: 22g     IV Site: R Hand IV Started by: Cathlyn Parsons, RN Chest Size (in) 38     Cup Size C     Height (in): 64.5 Weight (lb): 155 BMI: 26.29 Tech Comments: Metoprolol held x 24hrs. No diabetic meds this am. BS at home was 165.  Nuclear Med Study 1 or 2 day study:  1 day     Stress Test Type:  Eugenie Birks Reading MD:  Charlton Haws, MD     Referring MD:  P.Jordan Resting Radionuclide:  Technetium 70m Tetrofosmin     Resting Radionuclide Dose:  11 mCi  Stress Radionuclide:  Technetium 71m Tetrofosmin     Stress Radionuclide Dose:  32.8 mCi   Stress Protocol   Lexiscan: 0.4 mg   Stress Test Technologist:  Milana Na, EMT-P     Nuclear Technologist:  Doyne Keel, CNMT  Rest Procedure  Myocardial perfusion imaging was performed at rest 45 minutes following the intravenous administration of Technetium 81m Tetrofosmin.  Stress Procedure  The patient received IV Lexiscan 0.4 mg over 15-seconds.  Technetium 63m Tetrofosmin injected at 30-seconds.  There were no significant changes with infusion.  Quantitative spect images were obtained after a 45 minute delay.  QPS Raw Data Images:  Normal; no motion artifact; normal heart/lung ratio. Stress Images:  Normal homogeneous uptake in all areas of the myocardium. Rest Images:  Normal homogeneous uptake in all areas of the myocardium. Subtraction (SDS):  Normal Transient Ischemic  Dilatation:  0.88  (Normal <1.22)  Lung/Heart Ratio:  0.42  (Normal <0.45)  Quantitative Gated Spect Images QGS EDV:  44 ml QGS ESV:  6 ml QGS EF:  86 % QGS cine images:  normal  Findings Normal nuclear study      Overall Impression  Exercise Capacity: Lexiscan with no exercise. BP Response: Normal blood pressure response. Clinical Symptoms: Warm ECG Impression: No significant ST segment change suggestive of ischemia. Overall Impression: Normal stress nuclear study.  Appended Document: Cardiology Nuclear Testing copy sent to Dr. Swaziland

## 2010-08-04 LAB — BASIC METABOLIC PANEL
CO2: 24 mEq/L (ref 19–32)
Calcium: 9.1 mg/dL (ref 8.4–10.5)
Creatinine, Ser: 2.81 mg/dL — ABNORMAL HIGH (ref 0.4–1.2)
GFR calc Af Amer: 20 mL/min — ABNORMAL LOW (ref >60)
GFR calc non Af Amer: 16 mL/min — ABNORMAL LOW (ref >60)
Glucose, Bld: 77 mg/dL (ref 70–99)
Sodium: 141 mEq/L (ref 135–145)

## 2010-08-04 LAB — CBC
HCT: 30 % — ABNORMAL LOW (ref 36.0–46.0)
MCHC: 32.3 g/dL (ref 30.0–36.0)
Platelets: 479 10*3/uL — ABNORMAL HIGH (ref 150–400)
RDW: 14.1 % (ref 11.5–15.5)
WBC: 16.9 10*3/uL — ABNORMAL HIGH (ref 4.0–10.5)

## 2010-08-05 NOTE — Progress Notes (Signed)
Summary: Faxed records to [facility] at [facility].  Faxed records to [facility] at [facility]. STRESS Fax:[phone] Pho:[phone] Marylou Mccoy  July 19, 2010 9:31 AM

## 2010-08-09 NOTE — Discharge Summary (Signed)
NAMEMAAYAN, JENNING               ACCOUNT NO.:  [id number]  MEDICAL RECORD NO.:  [id number]           PATIENT TYPE:  I  LOCATION:  5123                         FACILITY:  [facility]  PHYSICIAN:  Cherylynn Ridges, M.D.    DATE OF BIRTH:  1927/12/24  DATE OF ADMISSION:  07/20/2010 DATE OF DISCHARGE:  08/04/2010                              DISCHARGE SUMMARY   DISCHARGE DIAGNOSES: 1. Functioning colostomy with diverticulosis. 2. Interstitial nephritis and/or acute tubular necrosis of the kidney. 3. Non-insulin-dependent diabetes. 4. Hypertension.  PRINCIPAL PROCEDURE:  Partial colectomy and reversal of colostomy by Dr. Lindie Spruce.  DISCHARGE MEDICATIONS: 1. Ferrous sulfate 325 mg p.o. b.i.d. 2. Vicodin tablets one to two by mouth every 6 hours as needed for     pain. 3. Maalox p.r.n. for reflux and/or indigestion. 4. Multivitamins 1 tablet p.o. daily. 5. Prednisone 40 mg p.o. daily. 6. Hydrocil Instant pack 1 pack per day. 7. Acarbose 25 mg p.o. t.i.d. 8. Glimepiride or Amaryl 2 mg p.o. daily. 9. Metoprolol 25 mg p.o. b.i.d. 10.Ramipril 10 mg p.o. b.i.d. 11.Synthroid 1 tablet p.o. daily. 12.Vitamin D 1000 units per day.  She has a followup appointment to see Dr. Lindie Spruce, or she is to make appointment to see Dr. Lindie Spruce in 2 weeks and also to be continued seen by Dr. Lowell Guitar in Nephrology.  At the end of February, she will get weekly BMETs done.  Her diet on discharge is carbohydrate modified diet. Condition is stable.  BRIEF SUMMARY OF HOSPITAL COURSE:  The patient was admitted on July 20, 2010, for reversal of colostomy and colectomy secondary to continued diverticular disease of the left colon.  The procedure went well, and the patient did well early in the postoperative period, although she did have a bump in her creatinine initially up to 22 and 2.0.  She was on Entereg and was started back on her Naprosyn, which the patient had taken preoperatively.  Postop day #2, she was doing  well.  Hemoglobin was down to 9.4.  She is making adequate urine.  Her white count was down to 13.9 and creatinine had dropped to 1.39.  She was maintained on a clear liquid diet.  Postoperative day #3, she continued to do well.  No labs were drawn. She was advanced to a full liquid diet.  Her wound looked well. Physical Therapy and Occupational Therapy were consulted, and they saw the patient.  The patient started having nausea on postop day #4 and was brought back down to a clear liquid diet.  Postoperative day #5, the patient was doing better.  Her diet was increased to a soft diet.  She was started on multivitamins and iron, so IV fluids were decreased.  He her creatinine was down to 1.19.  At that time, BUN was 11. Postoperative day #6, the patient was doing well.  She continued to do well.  Her IV fluids discontinued.  Her incision looked well.  No evidence of infection.  We increased her diet.  She continued with therapy and continued to do well.  On postop day #7, her creatinine had gone up to 1.39,  BUN was 12, potassium was 2.7.  Pathology demonstrated no evidence of cancer.  White count did bump up.  Her UA with micro was done, which showed sterile pyuria.  Postop day #8, her creatinine had gone up to 2.3, her BUN also had increased.  Her urine showed leukocyte esterase, positive for white cells, but no bacteria.  Some of her medications were changed.  She was taken off her Naprosyn and also her ACE inhibitor and also her nonsteroidal anti-inflammatory. Postop day #9, her creatinine went up to 2.90, BUN was 30.  Postop day #10, renal ultrasound was performed, which did not show any evidence of renal dilatation and no obstruction.  Renal consultation was obtained. It was their impression that the patient may have had an interstitial nephritis secondary to an allergic reaction and may be secondary to Naprosyn, and she has an acute failure.  Her creatinine had gone up to 5.66 by  that time.  Postop day #11, her creatinine was 4.4, the maximum creatinine was 5.66.  Postop day #12, creatinine was still going up. Postop day #13, creatinine started coming down for the first time, was down to 4.64, and postop day #14 which is today, the patient's creatinine was down to 2.8, BUN was down to 63 down from a high of 70. Her urine output was maintained throughout the episode of interstitial nephritis with acute tubular necrosis and it was nonoliguric failure.  I talked with Nephrology who saw the patient.  She has been started on steroids on postop day #11.  She has been maintained on 40 mg of prednisone once a day.  This seemed to have helped her nephritis.  He did allow for her white count to bump up to 16,000, which was stable. She is afebrile.  She is eating well, went to the bathroom well.  No abdominal pain, and seems to have had no significant postoperative complications in her abdomen.  She is otherwise doing well, was discharged home.  She will see me in 2 weeks and see Nephrology in approximately in a month.     Cherylynn Ridges, M.D.     JOW/MEDQ  D:  08/04/2010  T:  08/05/2010  Job:  [id number]  Electronically Signed by Jimmye Norman M.D. on 08/09/2010 02:23:37 PM

## 2010-09-19 LAB — BODY FLUID CELL COUNT WITH DIFFERENTIAL: Other Cells, Fluid: REACTIVE %

## 2010-09-19 LAB — COMPREHENSIVE METABOLIC PANEL
ALT: 79 U/L — ABNORMAL HIGH (ref 0–35)
Albumin: 1.8 g/dL — ABNORMAL LOW (ref 3.5–5.2)
Alkaline Phosphatase: 134 U/L — ABNORMAL HIGH (ref 39–117)
BUN: 24 mg/dL — ABNORMAL HIGH (ref 6–23)
Chloride: 106 mEq/L (ref 96–112)
Glucose, Bld: 102 mg/dL — ABNORMAL HIGH (ref 70–99)
Potassium: 4.1 mEq/L (ref 3.5–5.1)
Total Bilirubin: 0.4 mg/dL (ref 0.3–1.2)

## 2010-09-19 LAB — CBC
HCT: 27.1 % — ABNORMAL LOW (ref 36.0–46.0)
Hemoglobin: 9 g/dL — ABNORMAL LOW (ref 12.0–15.0)
Platelets: 346 10*3/uL (ref 150–400)
WBC: 12.2 10*3/uL — ABNORMAL HIGH (ref 4.0–10.5)

## 2010-09-19 LAB — LACTATE DEHYDROGENASE, PLEURAL OR PERITONEAL FLUID: LD, Fluid: 69 U/L — ABNORMAL HIGH (ref 3–23)

## 2010-09-19 LAB — PH, BODY FLUID

## 2010-09-19 LAB — CHOLESTEROL, BODY FLUID: Cholesterol, Fluid: 39 mg/dL

## 2010-09-19 LAB — PROTEIN, BODY FLUID: Total protein, fluid: 2.6 g/dL

## 2010-09-19 LAB — PREALBUMIN: Prealbumin: 10.4 mg/dL — ABNORMAL LOW (ref 18.0–45.0)

## 2010-09-19 LAB — GLUCOSE, SEROUS FLUID

## 2010-09-20 LAB — GLUCOSE, CAPILLARY
Glucose-Capillary: 156 mg/dL — ABNORMAL HIGH (ref 70–99)
Glucose-Capillary: 185 mg/dL — ABNORMAL HIGH (ref 70–99)
Glucose-Capillary: 200 mg/dL — ABNORMAL HIGH (ref 70–99)
Glucose-Capillary: 121 mg/dL — ABNORMAL HIGH (ref 70–99)
Glucose-Capillary: 138 mg/dL — ABNORMAL HIGH (ref 70–99)
Glucose-Capillary: 142 mg/dL — ABNORMAL HIGH (ref 70–99)
Glucose-Capillary: 143 mg/dL — ABNORMAL HIGH (ref 70–99)
Glucose-Capillary: 144 mg/dL — ABNORMAL HIGH (ref 70–99)
Glucose-Capillary: 150 mg/dL — ABNORMAL HIGH (ref 70–99)
Glucose-Capillary: 151 mg/dL — ABNORMAL HIGH (ref 70–99)
Glucose-Capillary: 151 mg/dL — ABNORMAL HIGH (ref 70–99)
Glucose-Capillary: 153 mg/dL — ABNORMAL HIGH (ref 70–99)
Glucose-Capillary: 155 mg/dL — ABNORMAL HIGH (ref 70–99)
Glucose-Capillary: 155 mg/dL — ABNORMAL HIGH (ref 70–99)
Glucose-Capillary: 164 mg/dL — ABNORMAL HIGH (ref 70–99)
Glucose-Capillary: 167 mg/dL — ABNORMAL HIGH (ref 70–99)
Glucose-Capillary: 171 mg/dL — ABNORMAL HIGH (ref 70–99)
Glucose-Capillary: 172 mg/dL — ABNORMAL HIGH (ref 70–99)
Glucose-Capillary: 173 mg/dL — ABNORMAL HIGH (ref 70–99)
Glucose-Capillary: 177 mg/dL — ABNORMAL HIGH (ref 70–99)
Glucose-Capillary: 180 mg/dL — ABNORMAL HIGH (ref 70–99)
Glucose-Capillary: 181 mg/dL — ABNORMAL HIGH (ref 70–99)
Glucose-Capillary: 182 mg/dL — ABNORMAL HIGH (ref 70–99)
Glucose-Capillary: 185 mg/dL — ABNORMAL HIGH (ref 70–99)
Glucose-Capillary: 189 mg/dL — ABNORMAL HIGH (ref 70–99)
Glucose-Capillary: 195 mg/dL — ABNORMAL HIGH (ref 70–99)
Glucose-Capillary: 198 mg/dL — ABNORMAL HIGH (ref 70–99)
Glucose-Capillary: 199 mg/dL — ABNORMAL HIGH (ref 70–99)
Glucose-Capillary: 202 mg/dL — ABNORMAL HIGH (ref 70–99)
Glucose-Capillary: 206 mg/dL — ABNORMAL HIGH (ref 70–99)
Glucose-Capillary: 210 mg/dL — ABNORMAL HIGH (ref 70–99)
Glucose-Capillary: 211 mg/dL — ABNORMAL HIGH (ref 70–99)
Glucose-Capillary: 216 mg/dL — ABNORMAL HIGH (ref 70–99)
Glucose-Capillary: 232 mg/dL — ABNORMAL HIGH (ref 70–99)
Glucose-Capillary: 238 mg/dL — ABNORMAL HIGH (ref 70–99)

## 2010-09-20 LAB — CBC
HCT: 24.2 % — ABNORMAL LOW (ref 36.0–46.0)
HCT: 24.5 % — ABNORMAL LOW (ref 36.0–46.0)
HCT: 25.2 % — ABNORMAL LOW (ref 36.0–46.0)
HCT: 25.2 % — ABNORMAL LOW (ref 36.0–46.0)
HCT: 25.5 % — ABNORMAL LOW (ref 36.0–46.0)
HCT: 26.2 % — ABNORMAL LOW (ref 36.0–46.0)
HCT: 26.8 % — ABNORMAL LOW (ref 36.0–46.0)
Hemoglobin: 7.8 g/dL — ABNORMAL LOW (ref 12.0–15.0)
Hemoglobin: 8.7 g/dL — ABNORMAL LOW (ref 12.0–15.0)
Hemoglobin: 8.8 g/dL — ABNORMAL LOW (ref 12.0–15.0)
Hemoglobin: 9.1 g/dL — ABNORMAL LOW (ref 12.0–15.0)
MCHC: 33.4 g/dL (ref 30.0–36.0)
MCHC: 34.1 g/dL (ref 30.0–36.0)
MCHC: 34.3 g/dL (ref 30.0–36.0)
MCHC: 34.8 g/dL (ref 30.0–36.0)
MCV: 88.6 fL (ref 78.0–100.0)
MCV: 89.5 fL (ref 78.0–100.0)
MCV: 89.9 fL (ref 78.0–100.0)
MCV: 90.8 fL (ref 78.0–100.0)
MCV: 91.2 fL (ref 78.0–100.0)
Platelets: 371 10*3/uL (ref 150–400)
Platelets: 334 10*3/uL (ref 150–400)
Platelets: 360 10*3/uL (ref 150–400)
Platelets: 364 10*3/uL (ref 150–400)
Platelets: 374 10*3/uL (ref 150–400)
Platelets: 395 10*3/uL (ref 150–400)
Platelets: 410 10*3/uL — ABNORMAL HIGH (ref 150–400)
Platelets: 421 10*3/uL — ABNORMAL HIGH (ref 150–400)
Platelets: 424 10*3/uL — ABNORMAL HIGH (ref 150–400)
Platelets: 445 10*3/uL — ABNORMAL HIGH (ref 150–400)
RBC: 2.86 MIL/uL — ABNORMAL LOW (ref 3.87–5.11)
RBC: 2.48 MIL/uL — ABNORMAL LOW (ref 3.87–5.11)
RBC: 2.71 MIL/uL — ABNORMAL LOW (ref 3.87–5.11)
RBC: 2.98 MIL/uL — ABNORMAL LOW (ref 3.87–5.11)
RDW: 16.6 % — ABNORMAL HIGH (ref 11.5–15.5)
RDW: 14.3 % (ref 11.5–15.5)
RDW: 14.3 % (ref 11.5–15.5)
RDW: 15 % (ref 11.5–15.5)
RDW: 15.1 % (ref 11.5–15.5)
RDW: 15.2 % (ref 11.5–15.5)
RDW: 15.6 % — ABNORMAL HIGH (ref 11.5–15.5)
RDW: 15.7 % — ABNORMAL HIGH (ref 11.5–15.5)
WBC: 8.8 10*3/uL (ref 4.0–10.5)
WBC: 11.4 10*3/uL — ABNORMAL HIGH (ref 4.0–10.5)
WBC: 11.5 10*3/uL — ABNORMAL HIGH (ref 4.0–10.5)
WBC: 14.6 10*3/uL — ABNORMAL HIGH (ref 4.0–10.5)
WBC: 16.5 10*3/uL — ABNORMAL HIGH (ref 4.0–10.5)
WBC: 16.6 10*3/uL — ABNORMAL HIGH (ref 4.0–10.5)

## 2010-09-20 LAB — COMPREHENSIVE METABOLIC PANEL
ALT: 104 U/L — ABNORMAL HIGH (ref 0–35)
ALT: 114 U/L — ABNORMAL HIGH (ref 0–35)
ALT: 108 U/L — ABNORMAL HIGH (ref 0–35)
ALT: 143 U/L — ABNORMAL HIGH (ref 0–35)
ALT: 70 U/L — ABNORMAL HIGH (ref 0–35)
ALT: 97 U/L — ABNORMAL HIGH (ref 0–35)
AST: 82 U/L — ABNORMAL HIGH (ref 0–37)
Albumin: 1.6 g/dL — ABNORMAL LOW (ref 3.5–5.2)
Albumin: 1.3 g/dL — ABNORMAL LOW (ref 3.5–5.2)
Albumin: 1.4 g/dL — ABNORMAL LOW (ref 3.5–5.2)
Albumin: 1.5 g/dL — ABNORMAL LOW (ref 3.5–5.2)
Alkaline Phosphatase: 185 U/L — ABNORMAL HIGH (ref 39–117)
Alkaline Phosphatase: 202 U/L — ABNORMAL HIGH (ref 39–117)
Alkaline Phosphatase: 261 U/L — ABNORMAL HIGH (ref 39–117)
Alkaline Phosphatase: 290 U/L — ABNORMAL HIGH (ref 39–117)
Alkaline Phosphatase: 361 U/L — ABNORMAL HIGH (ref 39–117)
Alkaline Phosphatase: 393 U/L — ABNORMAL HIGH (ref 39–117)
Alkaline Phosphatase: 393 U/L — ABNORMAL HIGH (ref 39–117)
Alkaline Phosphatase: 410 U/L — ABNORMAL HIGH (ref 39–117)
Alkaline Phosphatase: 425 U/L — ABNORMAL HIGH (ref 39–117)
BUN: 25 mg/dL — ABNORMAL HIGH (ref 6–23)
BUN: 21 mg/dL (ref 6–23)
BUN: 27 mg/dL — ABNORMAL HIGH (ref 6–23)
BUN: 36 mg/dL — ABNORMAL HIGH (ref 6–23)
BUN: 38 mg/dL — ABNORMAL HIGH (ref 6–23)
BUN: 39 mg/dL — ABNORMAL HIGH (ref 6–23)
CO2: 22 mEq/L (ref 19–32)
CO2: 20 mEq/L (ref 19–32)
CO2: 20 mEq/L (ref 19–32)
CO2: 22 mEq/L (ref 19–32)
Calcium: 7.9 mg/dL — ABNORMAL LOW (ref 8.4–10.5)
Calcium: 7.5 mg/dL — ABNORMAL LOW (ref 8.4–10.5)
Chloride: 110 mEq/L (ref 96–112)
Chloride: 107 mEq/L (ref 96–112)
Chloride: 107 mEq/L (ref 96–112)
Chloride: 110 mEq/L (ref 96–112)
Chloride: 111 mEq/L (ref 96–112)
Creatinine, Ser: 1.72 mg/dL — ABNORMAL HIGH (ref 0.4–1.2)
GFR calc Af Amer: 45 mL/min — ABNORMAL LOW (ref >60)
GFR calc non Af Amer: 30 mL/min — ABNORMAL LOW (ref >60)
GFR calc non Af Amer: 29 mL/min — ABNORMAL LOW (ref >60)
GFR calc non Af Amer: 31 mL/min — ABNORMAL LOW (ref >60)
GFR calc non Af Amer: 37 mL/min — ABNORMAL LOW (ref >60)
Glucose, Bld: 156 mg/dL — ABNORMAL HIGH (ref 70–99)
Glucose, Bld: 115 mg/dL — ABNORMAL HIGH (ref 70–99)
Glucose, Bld: 119 mg/dL — ABNORMAL HIGH (ref 70–99)
Glucose, Bld: 119 mg/dL — ABNORMAL HIGH (ref 70–99)
Glucose, Bld: 130 mg/dL — ABNORMAL HIGH (ref 70–99)
Glucose, Bld: 137 mg/dL — ABNORMAL HIGH (ref 70–99)
Potassium: 3.8 mEq/L (ref 3.5–5.1)
Potassium: 4.5 mEq/L (ref 3.5–5.1)
Potassium: 3.9 mEq/L (ref 3.5–5.1)
Potassium: 3.9 mEq/L (ref 3.5–5.1)
Potassium: 4.2 mEq/L (ref 3.5–5.1)
Potassium: 4.3 mEq/L (ref 3.5–5.1)
Potassium: 4.4 mEq/L (ref 3.5–5.1)
Potassium: 4.4 mEq/L (ref 3.5–5.1)
Potassium: 4.6 mEq/L (ref 3.5–5.1)
Sodium: 139 mEq/L (ref 135–145)
Sodium: 140 mEq/L (ref 135–145)
Sodium: 135 mEq/L (ref 135–145)
Sodium: 137 mEq/L (ref 135–145)
Sodium: 137 mEq/L (ref 135–145)
Sodium: 139 mEq/L (ref 135–145)
Total Bilirubin: 0.6 mg/dL (ref 0.3–1.2)
Total Bilirubin: 1.1 mg/dL (ref 0.3–1.2)
Total Bilirubin: 1.2 mg/dL (ref 0.3–1.2)
Total Bilirubin: 1.5 mg/dL — ABNORMAL HIGH (ref 0.3–1.2)
Total Bilirubin: 1.8 mg/dL — ABNORMAL HIGH (ref 0.3–1.2)
Total Protein: 5.4 g/dL — ABNORMAL LOW (ref 6.0–8.3)
Total Protein: 5.2 g/dL — ABNORMAL LOW (ref 6.0–8.3)
Total Protein: 5.2 g/dL — ABNORMAL LOW (ref 6.0–8.3)
Total Protein: 5.5 g/dL — ABNORMAL LOW (ref 6.0–8.3)

## 2010-09-20 LAB — DIFFERENTIAL
Basophils Absolute: 0 10*3/uL (ref 0.0–0.1)
Basophils Relative: 0 % (ref 0–1)
Basophils Relative: 1 % (ref 0–1)
Eosinophils Absolute: 0.4 10*3/uL (ref 0.0–0.7)
Eosinophils Absolute: 0.2 10*3/uL (ref 0.0–0.7)
Eosinophils Absolute: 0.4 10*3/uL (ref 0.0–0.7)
Eosinophils Relative: 2 % (ref 0–5)
Eosinophils Relative: 3 % (ref 0–5)
Lymphocytes Relative: 11 % — ABNORMAL LOW (ref 12–46)
Lymphocytes Relative: 9 % — ABNORMAL LOW (ref 12–46)
Lymphs Abs: 2.1 10*3/uL (ref 0.7–4.0)
Lymphs Abs: 1.4 10*3/uL (ref 0.7–4.0)
Lymphs Abs: 1.4 10*3/uL (ref 0.7–4.0)
Monocytes Absolute: 1.4 10*3/uL — ABNORMAL HIGH (ref 0.1–1.0)
Monocytes Relative: 7 % (ref 3–12)
Monocytes Relative: 10 % (ref 3–12)
Monocytes Relative: 7 % (ref 3–12)
Neutro Abs: 5.5 10*3/uL (ref 1.7–7.7)
Neutrophils Relative %: 63 % (ref 43–77)

## 2010-09-20 LAB — URINALYSIS, ROUTINE W REFLEX MICROSCOPIC
Bilirubin Urine: NEGATIVE
Hgb urine dipstick: NEGATIVE
Nitrite: NEGATIVE
Protein, ur: NEGATIVE mg/dL
Specific Gravity, Urine: 1.015 (ref 1.005–1.030)
Urobilinogen, UA: 0.2 mg/dL (ref 0.0–1.0)

## 2010-09-20 LAB — IRON AND TIBC: Iron: 13 ug/dL — ABNORMAL LOW (ref 42–135)

## 2010-09-20 LAB — PREALBUMIN: Prealbumin: 8.1 mg/dL — ABNORMAL LOW (ref 18.0–45.0)

## 2010-09-20 LAB — BASIC METABOLIC PANEL
CO2: 24 mEq/L (ref 19–32)
Chloride: 110 mEq/L (ref 96–112)
Chloride: 110 mEq/L (ref 96–112)
Creatinine, Ser: 1.8 mg/dL — ABNORMAL HIGH (ref 0.4–1.2)
GFR calc Af Amer: 33 mL/min — ABNORMAL LOW (ref >60)
GFR calc Af Amer: 33 mL/min — ABNORMAL LOW (ref >60)
Sodium: 141 mEq/L (ref 135–145)
Sodium: 142 mEq/L (ref 135–145)

## 2010-09-20 LAB — WOUND CULTURE

## 2010-09-20 LAB — URINE CULTURE: Colony Count: 60000

## 2010-09-20 LAB — MAGNESIUM
Magnesium: 1.6 mg/dL (ref 1.5–2.5)
Magnesium: 1.8 mg/dL (ref 1.5–2.5)
Magnesium: 2 mg/dL (ref 1.5–2.5)
Magnesium: 2.1 mg/dL (ref 1.5–2.5)

## 2010-09-20 LAB — HEMOGLOBIN A1C: Mean Plasma Glucose: 134 mg/dL — ABNORMAL HIGH (ref <117)

## 2010-09-20 LAB — CROSSMATCH

## 2010-09-20 LAB — PHOSPHORUS
Phosphorus: 3.5 mg/dL (ref 2.3–4.6)
Phosphorus: 4.1 mg/dL (ref 2.3–4.6)

## 2010-09-20 LAB — RETICULOCYTES
Retic Count, Absolute: 93.1 10*3/uL (ref 19.0–186.0)
Retic Ct Pct: 3.2 % — ABNORMAL HIGH (ref 0.4–3.1)

## 2010-09-20 LAB — TSH: TSH: 3.957 u[IU]/mL (ref 0.350–4.500)

## 2010-09-20 LAB — URINE MICROSCOPIC-ADD ON

## 2010-09-21 LAB — BASIC METABOLIC PANEL
BUN: 35 mg/dL — ABNORMAL HIGH (ref 6–23)
BUN: 40 mg/dL — ABNORMAL HIGH (ref 6–23)
BUN: 9 mg/dL (ref 6–23)
CO2: 13 mEq/L — ABNORMAL LOW (ref 19–32)
CO2: 18 mEq/L — ABNORMAL LOW (ref 19–32)
CO2: 21 mEq/L (ref 19–32)
CO2: 24 mEq/L (ref 19–32)
CO2: 26 mEq/L (ref 19–32)
CO2: 28 mEq/L (ref 19–32)
Calcium: 5.9 mg/dL — CL (ref 8.4–10.5)
Calcium: 6.1 mg/dL — CL (ref 8.4–10.5)
Calcium: 7.4 mg/dL — ABNORMAL LOW (ref 8.4–10.5)
Calcium: 8.4 mg/dL (ref 8.4–10.5)
Chloride: 102 mEq/L (ref 96–112)
Chloride: 107 mEq/L (ref 96–112)
Chloride: 107 mEq/L (ref 96–112)
Chloride: 108 mEq/L (ref 96–112)
Chloride: 113 mEq/L — ABNORMAL HIGH (ref 96–112)
Creatinine, Ser: 2.38 mg/dL — ABNORMAL HIGH (ref 0.4–1.2)
Creatinine, Ser: 1.14 mg/dL (ref 0.4–1.2)
GFR calc Af Amer: 21 mL/min — ABNORMAL LOW (ref >60)
GFR calc Af Amer: 21 mL/min — ABNORMAL LOW (ref >60)
GFR calc Af Amer: 22 mL/min — ABNORMAL LOW (ref >60)
GFR calc Af Amer: 23 mL/min — ABNORMAL LOW (ref >60)
GFR calc Af Amer: 24 mL/min — ABNORMAL LOW (ref >60)
GFR calc Af Amer: 25 mL/min — ABNORMAL LOW (ref >60)
GFR calc Af Amer: 56 mL/min — ABNORMAL LOW (ref >60)
GFR calc non Af Amer: 19 mL/min — ABNORMAL LOW (ref >60)
GFR calc non Af Amer: 46 mL/min — ABNORMAL LOW (ref >60)
Glucose, Bld: 145 mg/dL — ABNORMAL HIGH (ref 70–99)
Glucose, Bld: 194 mg/dL — ABNORMAL HIGH (ref 70–99)
Glucose, Bld: 314 mg/dL — ABNORMAL HIGH (ref 70–99)
Potassium: 3.1 mEq/L — ABNORMAL LOW (ref 3.5–5.1)
Potassium: 3.8 mEq/L (ref 3.5–5.1)
Potassium: 4.1 mEq/L (ref 3.5–5.1)
Potassium: 4.3 mEq/L (ref 3.5–5.1)
Sodium: 134 mEq/L — ABNORMAL LOW (ref 135–145)
Sodium: 134 mEq/L — ABNORMAL LOW (ref 135–145)
Sodium: 137 mEq/L (ref 135–145)
Sodium: 138 mEq/L (ref 135–145)
Sodium: 138 mEq/L (ref 135–145)
Sodium: 140 mEq/L (ref 135–145)

## 2010-09-21 LAB — CBC
HCT: 24.4 % — ABNORMAL LOW (ref 36.0–46.0)
HCT: 24.7 % — ABNORMAL LOW (ref 36.0–46.0)
HCT: 25.6 % — ABNORMAL LOW (ref 36.0–46.0)
HCT: 26 % — ABNORMAL LOW (ref 36.0–46.0)
HCT: 33 % — ABNORMAL LOW (ref 36.0–46.0)
HCT: 35.9 % — ABNORMAL LOW (ref 36.0–46.0)
Hemoglobin: 11.1 g/dL — ABNORMAL LOW (ref 12.0–15.0)
Hemoglobin: 12.3 g/dL (ref 12.0–15.0)
Hemoglobin: 8.4 g/dL — ABNORMAL LOW (ref 12.0–15.0)
Hemoglobin: 8.4 g/dL — ABNORMAL LOW (ref 12.0–15.0)
Hemoglobin: 8.7 g/dL — ABNORMAL LOW (ref 12.0–15.0)
Hemoglobin: 9 g/dL — ABNORMAL LOW (ref 12.0–15.0)
Hemoglobin: 9.2 g/dL — ABNORMAL LOW (ref 12.0–15.0)
Hemoglobin: 9.7 g/dL — ABNORMAL LOW (ref 12.0–15.0)
MCHC: 34 g/dL (ref 30.0–36.0)
MCHC: 34 g/dL (ref 30.0–36.0)
MCHC: 34 g/dL (ref 30.0–36.0)
MCHC: 34.2 g/dL (ref 30.0–36.0)
MCHC: 34.2 g/dL (ref 30.0–36.0)
MCHC: 34.3 g/dL (ref 30.0–36.0)
MCHC: 34.5 g/dL (ref 30.0–36.0)
MCHC: 34.7 g/dL (ref 30.0–36.0)
MCHC: 33.6 g/dL (ref 30.0–36.0)
MCHC: 33.6 g/dL (ref 30.0–36.0)
MCV: 91.1 fL (ref 78.0–100.0)
MCV: 91.2 fL (ref 78.0–100.0)
MCV: 91.8 fL (ref 78.0–100.0)
MCV: 92.4 fL (ref 78.0–100.0)
MCV: 93.1 fL (ref 78.0–100.0)
MCV: 92.5 fL (ref 78.0–100.0)
Platelets: 145 10*3/uL — ABNORMAL LOW (ref 150–400)
Platelets: 284 10*3/uL (ref 150–400)
Platelets: 157 10*3/uL (ref 150–400)
Platelets: 164 10*3/uL (ref 150–400)
Platelets: 210 10*3/uL (ref 150–400)
RBC: 2.79 MIL/uL — ABNORMAL LOW (ref 3.87–5.11)
RBC: 2.85 MIL/uL — ABNORMAL LOW (ref 3.87–5.11)
RBC: 2.94 MIL/uL — ABNORMAL LOW (ref 3.87–5.11)
RBC: 3.06 MIL/uL — ABNORMAL LOW (ref 3.87–5.11)
RBC: 3.84 MIL/uL — ABNORMAL LOW (ref 3.87–5.11)
RBC: 5.1 MIL/uL (ref 3.87–5.11)
RBC: 4.2 MIL/uL (ref 3.87–5.11)
RDW: 13.1 % (ref 11.5–15.5)
RDW: 14 % (ref 11.5–15.5)
RDW: 14.3 % (ref 11.5–15.5)
RDW: 14.4 % (ref 11.5–15.5)
RDW: 12.3 % (ref 11.5–15.5)
RDW: 12.8 % (ref 11.5–15.5)
WBC: 12.1 10*3/uL — ABNORMAL HIGH (ref 4.0–10.5)
WBC: 14.4 10*3/uL — ABNORMAL HIGH (ref 4.0–10.5)
WBC: 14.7 10*3/uL — ABNORMAL HIGH (ref 4.0–10.5)
WBC: 15.6 10*3/uL — ABNORMAL HIGH (ref 4.0–10.5)
WBC: 12.8 10*3/uL — ABNORMAL HIGH (ref 4.0–10.5)

## 2010-09-21 LAB — GLUCOSE, CAPILLARY
Glucose-Capillary: 142 mg/dL — ABNORMAL HIGH (ref 70–99)
Glucose-Capillary: 144 mg/dL — ABNORMAL HIGH (ref 70–99)
Glucose-Capillary: 146 mg/dL — ABNORMAL HIGH (ref 70–99)
Glucose-Capillary: 151 mg/dL — ABNORMAL HIGH (ref 70–99)
Glucose-Capillary: 154 mg/dL — ABNORMAL HIGH (ref 70–99)
Glucose-Capillary: 156 mg/dL — ABNORMAL HIGH (ref 70–99)
Glucose-Capillary: 156 mg/dL — ABNORMAL HIGH (ref 70–99)
Glucose-Capillary: 163 mg/dL — ABNORMAL HIGH (ref 70–99)
Glucose-Capillary: 163 mg/dL — ABNORMAL HIGH (ref 70–99)
Glucose-Capillary: 167 mg/dL — ABNORMAL HIGH (ref 70–99)
Glucose-Capillary: 167 mg/dL — ABNORMAL HIGH (ref 70–99)
Glucose-Capillary: 169 mg/dL — ABNORMAL HIGH (ref 70–99)
Glucose-Capillary: 172 mg/dL — ABNORMAL HIGH (ref 70–99)
Glucose-Capillary: 172 mg/dL — ABNORMAL HIGH (ref 70–99)
Glucose-Capillary: 175 mg/dL — ABNORMAL HIGH (ref 70–99)
Glucose-Capillary: 175 mg/dL — ABNORMAL HIGH (ref 70–99)
Glucose-Capillary: 177 mg/dL — ABNORMAL HIGH (ref 70–99)
Glucose-Capillary: 179 mg/dL — ABNORMAL HIGH (ref 70–99)
Glucose-Capillary: 187 mg/dL — ABNORMAL HIGH (ref 70–99)
Glucose-Capillary: 192 mg/dL — ABNORMAL HIGH (ref 70–99)
Glucose-Capillary: 193 mg/dL — ABNORMAL HIGH (ref 70–99)
Glucose-Capillary: 194 mg/dL — ABNORMAL HIGH (ref 70–99)
Glucose-Capillary: 199 mg/dL — ABNORMAL HIGH (ref 70–99)
Glucose-Capillary: 215 mg/dL — ABNORMAL HIGH (ref 70–99)
Glucose-Capillary: 217 mg/dL — ABNORMAL HIGH (ref 70–99)
Glucose-Capillary: 223 mg/dL — ABNORMAL HIGH (ref 70–99)
Glucose-Capillary: 29 mg/dL — CL (ref 70–99)
Glucose-Capillary: 50 mg/dL — ABNORMAL LOW (ref 70–99)
Glucose-Capillary: 59 mg/dL — ABNORMAL LOW (ref 70–99)
Glucose-Capillary: 71 mg/dL (ref 70–99)
Glucose-Capillary: 71 mg/dL (ref 70–99)
Glucose-Capillary: 72 mg/dL (ref 70–99)
Glucose-Capillary: 72 mg/dL (ref 70–99)
Glucose-Capillary: 74 mg/dL (ref 70–99)
Glucose-Capillary: 76 mg/dL (ref 70–99)
Glucose-Capillary: 79 mg/dL (ref 70–99)
Glucose-Capillary: 85 mg/dL (ref 70–99)
Glucose-Capillary: 96 mg/dL (ref 70–99)
Glucose-Capillary: 97 mg/dL (ref 70–99)
Glucose-Capillary: 98 mg/dL (ref 70–99)
Glucose-Capillary: 165 mg/dL — ABNORMAL HIGH (ref 70–99)
Glucose-Capillary: 106 mg/dL — ABNORMAL HIGH (ref 70–99)
Glucose-Capillary: 119 mg/dL — ABNORMAL HIGH (ref 70–99)
Glucose-Capillary: 136 mg/dL — ABNORMAL HIGH (ref 70–99)
Glucose-Capillary: 139 mg/dL — ABNORMAL HIGH (ref 70–99)
Glucose-Capillary: 141 mg/dL — ABNORMAL HIGH (ref 70–99)
Glucose-Capillary: 143 mg/dL — ABNORMAL HIGH (ref 70–99)
Glucose-Capillary: 152 mg/dL — ABNORMAL HIGH (ref 70–99)
Glucose-Capillary: 157 mg/dL — ABNORMAL HIGH (ref 70–99)
Glucose-Capillary: 162 mg/dL — ABNORMAL HIGH (ref 70–99)
Glucose-Capillary: 162 mg/dL — ABNORMAL HIGH (ref 70–99)
Glucose-Capillary: 162 mg/dL — ABNORMAL HIGH (ref 70–99)
Glucose-Capillary: 169 mg/dL — ABNORMAL HIGH (ref 70–99)
Glucose-Capillary: 177 mg/dL — ABNORMAL HIGH (ref 70–99)
Glucose-Capillary: 178 mg/dL — ABNORMAL HIGH (ref 70–99)
Glucose-Capillary: 179 mg/dL — ABNORMAL HIGH (ref 70–99)
Glucose-Capillary: 184 mg/dL — ABNORMAL HIGH (ref 70–99)
Glucose-Capillary: 194 mg/dL — ABNORMAL HIGH (ref 70–99)
Glucose-Capillary: 239 mg/dL — ABNORMAL HIGH (ref 70–99)
Glucose-Capillary: 240 mg/dL — ABNORMAL HIGH (ref 70–99)
Glucose-Capillary: 242 mg/dL — ABNORMAL HIGH (ref 70–99)
Glucose-Capillary: 265 mg/dL — ABNORMAL HIGH (ref 70–99)

## 2010-09-21 LAB — POCT I-STAT 3, ART BLOOD GAS (G3+)
Acid-base deficit: 12 mmol/L — ABNORMAL HIGH (ref 0.0–2.0)
Acid-base deficit: 14 mmol/L — ABNORMAL HIGH (ref 0.0–2.0)
Acid-base deficit: 15 mmol/L — ABNORMAL HIGH (ref 0.0–2.0)
Bicarbonate: 10.3 mEq/L — ABNORMAL LOW (ref 20.0–24.0)
Bicarbonate: 11.5 mEq/L — ABNORMAL LOW (ref 20.0–24.0)
Bicarbonate: 13.1 mEq/L — ABNORMAL LOW (ref 20.0–24.0)
Bicarbonate: 20.1 mEq/L (ref 20.0–24.0)
Bicarbonate: 22.3 mEq/L (ref 20.0–24.0)
O2 Saturation: 92 %
O2 Saturation: 93 %
O2 Saturation: 95 %
O2 Saturation: 96 %
O2 Saturation: 96 %
O2 Saturation: 99 %
Patient temperature: 37.2
Patient temperature: 37.4
Patient temperature: 94
Patient temperature: 98.5
TCO2: 11 mmol/L (ref 0–100)
TCO2: 12 mmol/L (ref 0–100)
TCO2: 14 mmol/L (ref 0–100)
TCO2: 18 mmol/L (ref 0–100)
TCO2: 21 mmol/L (ref 0–100)
TCO2: 23 mmol/L (ref 0–100)
pCO2 arterial: 22 mmHg — ABNORMAL LOW (ref 35.0–45.0)
pCO2 arterial: 24.6 mmHg — ABNORMAL LOW (ref 35.0–45.0)
pCO2 arterial: 27.6 mmHg — ABNORMAL LOW (ref 35.0–45.0)
pCO2 arterial: 28.3 mmHg — ABNORMAL LOW (ref 35.0–45.0)
pCO2 arterial: 28.5 mmHg — ABNORMAL LOW (ref 35.0–45.0)
pH, Arterial: 7.367 (ref 7.350–7.400)
pH, Arterial: 7.449 — ABNORMAL HIGH (ref 7.350–7.400)
pH, Arterial: 7.458 — ABNORMAL HIGH (ref 7.350–7.400)
pH, Arterial: 7.515 — ABNORMAL HIGH (ref 7.350–7.400)
pO2, Arterial: 62 mmHg — ABNORMAL LOW (ref 80.0–100.0)
pO2, Arterial: 63 mmHg — ABNORMAL LOW (ref 80.0–100.0)
pO2, Arterial: 65 mmHg — ABNORMAL LOW (ref 80.0–100.0)

## 2010-09-21 LAB — CULTURE, BLOOD (ROUTINE X 2): Culture: NO GROWTH

## 2010-09-21 LAB — COMPREHENSIVE METABOLIC PANEL
ALT: 31 U/L (ref 0–35)
ALT: 33 U/L (ref 0–35)
ALT: 34 U/L (ref 0–35)
ALT: 39 U/L — ABNORMAL HIGH (ref 0–35)
ALT: 52 U/L — ABNORMAL HIGH (ref 0–35)
ALT: 29 U/L (ref 0–35)
AST: 33 U/L (ref 0–37)
AST: 73 U/L — ABNORMAL HIGH (ref 0–37)
AST: 34 U/L (ref 0–37)
AST: 39 U/L — ABNORMAL HIGH (ref 0–37)
Albumin: 1.3 g/dL — ABNORMAL LOW (ref 3.5–5.2)
Albumin: 1.4 g/dL — ABNORMAL LOW (ref 3.5–5.2)
Albumin: 1.5 g/dL — ABNORMAL LOW (ref 3.5–5.2)
Albumin: 3.1 g/dL — ABNORMAL LOW (ref 3.5–5.2)
Albumin: 4.1 g/dL (ref 3.5–5.2)
Alkaline Phosphatase: 102 U/L (ref 39–117)
Alkaline Phosphatase: 106 U/L (ref 39–117)
Alkaline Phosphatase: 131 U/L — ABNORMAL HIGH (ref 39–117)
Alkaline Phosphatase: 131 U/L — ABNORMAL HIGH (ref 39–117)
Alkaline Phosphatase: 193 U/L — ABNORMAL HIGH (ref 39–117)
Alkaline Phosphatase: 57 U/L (ref 39–117)
Alkaline Phosphatase: 61 U/L (ref 39–117)
Alkaline Phosphatase: 81 U/L (ref 39–117)
BUN: 31 mg/dL — ABNORMAL HIGH (ref 6–23)
BUN: 32 mg/dL — ABNORMAL HIGH (ref 6–23)
BUN: 38 mg/dL — ABNORMAL HIGH (ref 6–23)
BUN: 39 mg/dL — ABNORMAL HIGH (ref 6–23)
BUN: 42 mg/dL — ABNORMAL HIGH (ref 6–23)
CO2: 13 mEq/L — ABNORMAL LOW (ref 19–32)
CO2: 17 mEq/L — ABNORMAL LOW (ref 19–32)
CO2: 24 mEq/L (ref 19–32)
CO2: 25 mEq/L (ref 19–32)
CO2: 27 mEq/L (ref 19–32)
CO2: 30 mEq/L (ref 19–32)
Calcium: 6.3 mg/dL — CL (ref 8.4–10.5)
Calcium: 6.7 mg/dL — ABNORMAL LOW (ref 8.4–10.5)
Calcium: 8.9 mg/dL (ref 8.4–10.5)
Calcium: 8.7 mg/dL (ref 8.4–10.5)
Calcium: 9.9 mg/dL (ref 8.4–10.5)
Chloride: 100 mEq/L (ref 96–112)
Chloride: 101 mEq/L (ref 96–112)
Chloride: 103 mEq/L (ref 96–112)
Chloride: 110 mEq/L (ref 96–112)
Creatinine, Ser: 1.89 mg/dL — ABNORMAL HIGH (ref 0.4–1.2)
Creatinine, Ser: 2.08 mg/dL — ABNORMAL HIGH (ref 0.4–1.2)
Creatinine, Ser: 2.5 mg/dL — ABNORMAL HIGH (ref 0.4–1.2)
Creatinine, Ser: 1.08 mg/dL (ref 0.4–1.2)
GFR calc Af Amer: 22 mL/min — ABNORMAL LOW (ref >60)
GFR calc Af Amer: 59 mL/min — ABNORMAL LOW (ref >60)
GFR calc Af Amer: >60 mL/min (ref >60)
GFR calc non Af Amer: 14 mL/min — ABNORMAL LOW (ref >60)
GFR calc non Af Amer: 19 mL/min — ABNORMAL LOW (ref >60)
GFR calc non Af Amer: 19 mL/min — ABNORMAL LOW (ref >60)
GFR calc non Af Amer: 20 mL/min — ABNORMAL LOW (ref >60)
GFR calc non Af Amer: 23 mL/min — ABNORMAL LOW (ref >60)
GFR calc non Af Amer: 49 mL/min — ABNORMAL LOW (ref >60)
Glucose, Bld: 119 mg/dL — ABNORMAL HIGH (ref 70–99)
Glucose, Bld: 165 mg/dL — ABNORMAL HIGH (ref 70–99)
Glucose, Bld: 179 mg/dL — ABNORMAL HIGH (ref 70–99)
Glucose, Bld: 208 mg/dL — ABNORMAL HIGH (ref 70–99)
Glucose, Bld: 211 mg/dL — ABNORMAL HIGH (ref 70–99)
Glucose, Bld: 233 mg/dL — ABNORMAL HIGH (ref 70–99)
Potassium: 3.2 mEq/L — ABNORMAL LOW (ref 3.5–5.1)
Potassium: 3.5 mEq/L (ref 3.5–5.1)
Potassium: 3.6 mEq/L (ref 3.5–5.1)
Potassium: 3.8 mEq/L (ref 3.5–5.1)
Potassium: 4.3 mEq/L (ref 3.5–5.1)
Potassium: 4.4 mEq/L (ref 3.5–5.1)
Potassium: 4.4 mEq/L (ref 3.5–5.1)
Potassium: 3.5 mEq/L (ref 3.5–5.1)
Sodium: 133 mEq/L — ABNORMAL LOW (ref 135–145)
Sodium: 135 mEq/L (ref 135–145)
Sodium: 140 mEq/L (ref 135–145)
Sodium: 126 mEq/L — ABNORMAL LOW (ref 135–145)
Total Bilirubin: 1.4 mg/dL — ABNORMAL HIGH (ref 0.3–1.2)
Total Bilirubin: 1.5 mg/dL — ABNORMAL HIGH (ref 0.3–1.2)
Total Bilirubin: 2.2 mg/dL — ABNORMAL HIGH (ref 0.3–1.2)
Total Bilirubin: 2.4 mg/dL — ABNORMAL HIGH (ref 0.3–1.2)
Total Bilirubin: 2.6 mg/dL — ABNORMAL HIGH (ref 0.3–1.2)
Total Protein: 3.6 g/dL — ABNORMAL LOW (ref 6.0–8.3)
Total Protein: 4.1 g/dL — ABNORMAL LOW (ref 6.0–8.3)
Total Protein: 4.7 g/dL — ABNORMAL LOW (ref 6.0–8.3)
Total Protein: 5.3 g/dL — ABNORMAL LOW (ref 6.0–8.3)
Total Protein: 6.4 g/dL (ref 6.0–8.3)

## 2010-09-21 LAB — DIFFERENTIAL
Basophils Absolute: 0 10*3/uL (ref 0.0–0.1)
Basophils Absolute: 0 10*3/uL (ref 0.0–0.1)
Basophils Absolute: 0 10*3/uL (ref 0.0–0.1)
Basophils Relative: 0 % (ref 0–1)
Basophils Relative: 0 % (ref 0–1)
Basophils Relative: 0 % (ref 0–1)
Basophils Relative: 0 % (ref 0–1)
Basophils Relative: 1 % (ref 0–1)
Eosinophils Absolute: 0 10*3/uL (ref 0.0–0.7)
Eosinophils Absolute: 0 10*3/uL (ref 0.0–0.7)
Eosinophils Absolute: 0.1 10*3/uL (ref 0.0–0.7)
Eosinophils Absolute: 0.1 10*3/uL (ref 0.0–0.7)
Eosinophils Absolute: 0.2 10*3/uL (ref 0.0–0.7)
Lymphocytes Relative: 11 % — ABNORMAL LOW (ref 12–46)
Lymphocytes Relative: 6 % — ABNORMAL LOW (ref 12–46)
Lymphs Abs: 0.9 10*3/uL (ref 0.7–4.0)
Lymphs Abs: 1 10*3/uL (ref 0.7–4.0)
Monocytes Absolute: 0.3 10*3/uL (ref 0.1–1.0)
Monocytes Absolute: 0.6 10*3/uL (ref 0.1–1.0)
Monocytes Relative: 7 % (ref 3–12)
Monocytes Relative: 8 % (ref 3–12)
Neutro Abs: 12.7 10*3/uL — ABNORMAL HIGH (ref 1.7–7.7)
Neutro Abs: 13.6 10*3/uL — ABNORMAL HIGH (ref 1.7–7.7)
Neutro Abs: 4.7 10*3/uL (ref 1.7–7.7)
Neutro Abs: 9.4 10*3/uL — ABNORMAL HIGH (ref 1.7–7.7)
Neutro Abs: 3.9 10*3/uL (ref 1.7–7.7)
Neutrophils Relative %: 80 % — ABNORMAL HIGH (ref 43–77)
Neutrophils Relative %: 87 % — ABNORMAL HIGH (ref 43–77)
Neutrophils Relative %: 89 % — ABNORMAL HIGH (ref 43–77)
WBC Morphology: INCREASED

## 2010-09-21 LAB — POCT I-STAT, CHEM 8
BUN: 29 mg/dL — ABNORMAL HIGH (ref 6–23)
BUN: 38 mg/dL — ABNORMAL HIGH (ref 6–23)
Chloride: 106 mEq/L (ref 96–112)
Creatinine, Ser: 2.9 mg/dL — ABNORMAL HIGH (ref 0.4–1.2)
Creatinine, Ser: 3 mg/dL — ABNORMAL HIGH (ref 0.4–1.2)
Hemoglobin: 16.7 g/dL — ABNORMAL HIGH (ref 12.0–15.0)
Potassium: 4.2 mEq/L (ref 3.5–5.1)
Sodium: 133 mEq/L — ABNORMAL LOW (ref 135–145)
Sodium: 138 mEq/L (ref 135–145)
TCO2: 21 mmol/L (ref 0–100)

## 2010-09-21 LAB — FOLATE: Folate: 8.4 ng/mL

## 2010-09-21 LAB — URINALYSIS, ROUTINE W REFLEX MICROSCOPIC
Hgb urine dipstick: NEGATIVE
Nitrite: NEGATIVE
Specific Gravity, Urine: 1.018 (ref 1.005–1.030)
pH: 5 (ref 5.0–8.0)

## 2010-09-21 LAB — CARDIAC PANEL(CRET KIN+CKTOT+MB+TROPI)
CK, MB: 18.2 ng/mL (ref 0.3–4.0)
Relative Index: 2.9 — ABNORMAL HIGH (ref 0.0–2.5)
Total CK: 454 U/L — ABNORMAL HIGH (ref 7–177)
Total CK: 611 U/L — ABNORMAL HIGH (ref 7–177)
Total CK: 622 U/L — ABNORMAL HIGH (ref 7–177)
Troponin I: 0.43 ng/mL — ABNORMAL HIGH (ref 0.00–0.06)
Troponin I: 0.47 ng/mL — ABNORMAL HIGH (ref 0.00–0.06)

## 2010-09-21 LAB — PROCALCITONIN
Procalcitonin: 108.48 ng/mL
Procalcitonin: 112.31 ng/mL
Procalcitonin: 61.09 ng/mL

## 2010-09-21 LAB — CALCIUM, IONIZED: Calcium, Ion: 0.92 mmol/L — ABNORMAL LOW (ref 1.12–1.32)

## 2010-09-21 LAB — URINE MICROSCOPIC-ADD ON

## 2010-09-21 LAB — HEPATITIS PANEL, ACUTE: Hep B C IgM: NEGATIVE

## 2010-09-21 LAB — CK TOTAL AND CKMB (NOT AT ARMC)
CK, MB: 8 ng/mL (ref 0.3–4.0)
Relative Index: 4.1 — ABNORMAL HIGH (ref 0.0–2.5)
Relative Index: INVALID (ref 0.0–2.5)
Total CK: 195 U/L — ABNORMAL HIGH (ref 7–177)

## 2010-09-21 LAB — POCT CARDIAC MARKERS
CKMB, poc: 1.2 ng/mL (ref 1.0–8.0)
Myoglobin, poc: 310 ng/mL (ref 12–200)

## 2010-09-21 LAB — ABO/RH
ABO/RH(D): O POS
ABO/RH(D): O POS

## 2010-09-21 LAB — PHOSPHORUS
Phosphorus: 3.1 mg/dL (ref 2.3–4.6)
Phosphorus: 4.7 mg/dL — ABNORMAL HIGH (ref 2.3–4.6)

## 2010-09-21 LAB — MAGNESIUM
Magnesium: 1.9 mg/dL (ref 1.5–2.5)
Magnesium: 2.1 mg/dL (ref 1.5–2.5)

## 2010-09-21 LAB — CARBOXYHEMOGLOBIN: Methemoglobin: 1.1 % (ref 0.0–1.5)

## 2010-09-21 LAB — PROLACTIN: Prolactin: 32.2 ng/mL

## 2010-09-21 LAB — LACTIC ACID, PLASMA
Lactic Acid, Venous: 2.6 mmol/L — ABNORMAL HIGH (ref 0.5–2.2)
Lactic Acid, Venous: 6.3 mmol/L — ABNORMAL HIGH (ref 0.5–2.2)
Lactic Acid, Venous: 7.3 mmol/L — ABNORMAL HIGH (ref 0.5–2.2)

## 2010-09-21 LAB — BODY FLUID CULTURE

## 2010-09-21 LAB — TYPE AND SCREEN
ABO/RH(D): O POS
Antibody Screen: NEGATIVE
Antibody Screen: NEGATIVE

## 2010-09-21 LAB — PROTIME-INR
Prothrombin Time: 14.6 seconds (ref 11.6–15.2)
Prothrombin Time: 18.1 seconds — ABNORMAL HIGH (ref 11.6–15.2)

## 2010-09-21 LAB — HEMOGLOBIN AND HEMATOCRIT, BLOOD: HCT: 37.9 % (ref 36.0–46.0)

## 2010-09-21 LAB — URINE CULTURE

## 2010-09-21 LAB — LACTATE DEHYDROGENASE: LDH: 189 U/L (ref 94–250)

## 2010-09-21 LAB — IRON AND TIBC
Saturation Ratios: 13 % — ABNORMAL LOW (ref 20–55)
UIBC: 164 ug/dL

## 2010-09-21 LAB — AMYLASE: Amylase: 27 U/L (ref 0–105)

## 2010-09-21 LAB — D-DIMER, QUANTITATIVE: D-Dimer, Quant: 4.07 ug/mL-FEU — ABNORMAL HIGH (ref 0.00–0.48)

## 2010-09-21 LAB — TROPONIN I: Troponin I: 0.04 ng/mL (ref 0.00–0.06)

## 2010-09-21 LAB — CORTISOL: Cortisol, Plasma: 41 ug/dL

## 2010-09-21 LAB — GRAM STAIN

## 2010-09-24 LAB — URINALYSIS, ROUTINE W REFLEX MICROSCOPIC
Bilirubin Urine: NEGATIVE
Ketones, ur: NEGATIVE mg/dL
Nitrite: NEGATIVE
Protein, ur: NEGATIVE mg/dL
pH: 6 (ref 5.0–8.0)

## 2010-09-24 LAB — URINE MICROSCOPIC-ADD ON

## 2010-09-27 LAB — BASIC METABOLIC PANEL
Chloride: 102 mEq/L (ref 96–112)
GFR calc non Af Amer: 52 mL/min — ABNORMAL LOW (ref >60)
Glucose, Bld: 235 mg/dL — ABNORMAL HIGH (ref 70–99)
Potassium: 4.5 mEq/L (ref 3.5–5.1)
Sodium: 135 mEq/L (ref 135–145)

## 2010-09-27 LAB — CBC
HCT: 46.2 % — ABNORMAL HIGH (ref 36.0–46.0)
Hemoglobin: 15.2 g/dL — ABNORMAL HIGH (ref 12.0–15.0)
MCV: 93.7 fL (ref 78.0–100.0)
WBC: 6.2 10*3/uL (ref 4.0–10.5)

## 2010-11-19 NOTE — Discharge Summary (Signed)
NAME:  Catherine Mason, Catherine Mason                       ACCOUNT NO.:  [id number]   MEDICAL RECORD NO.:  [id number]                   PATIENT TYPE:  OIB   LOCATION:  6533                                 FACILITY:  [facility]   PHYSICIAN:  PETER Swaziland, M.D.                  DATE OF BIRTH:  Feb 18, 1928   DATE OF ADMISSION:  02/18/2002  DATE OF DISCHARGE:                                 DISCHARGE SUMMARY   HISTORY OF PRESENT ILLNESS:  The patient is a 77 year old white female with  a history of hypertension, hyperlipidemia, who presented with exertional  chest pain.  She had a positive stress Cardiolite study for anterior  ischemia.   For the past medical history, social history, family history and physical  examination please see the admission history and physical.   LABORATORY DATA:  ECG showed a normal sinus rhythm, a normal ECG. A chest x-  ray showed no active disease.   Triglycerides 206, cholesterol 198, HDL 59, LDL 98.  Coags are normal.  CBC  was normal.  BMET was normal with the exception of a glucose of 138.   HOSPITAL COURSE:  The patient was admitted and underwent a cardiac  catheterization.  This demonstrated a 30% disease in the proximal and mid-  LAD.  There was an 80% ostial stenosis in the first marginal branch.  The  right coronary artery demonstrated diffuse segmental disease in the mid  vessel with a subtotal occlusion in the mid vessel up to 99%.  There was  only TIMI grade 1 flow in the left to right collaterals to the distal right  coronary artery.  A left ventricular angiography demonstrated mild inferior  hypokinesia with an ejection fraction of approximately 60%.  We subsequently  intervened in the right coronary artery.  This was a long lesion and  required two stents to adequately cover.  These included a 2.75 mm x 28.0 mm  Zeta stent and a 2.75 mm x 13.0 mm Zeta stent.  The patient was also given a  double bolus of Integrilin, followed by continuous infusion.   After  stenting, an excellent angiographic result was obtained with 0% residual  stenosis.  There was TIMI grade 3 flow distally.  She did have 40%-50%  narrowing in the distal right coronary artery.  The patient post-procedure  did develop moderate ecchymosis at the groin site, but no significant focal  hematoma.  There is no bruit.  Her pulses remained stable.  She had no  recurrent chest pain.  The CBC remained stable.  The CPK-MB was normal.   DISPOSITION:  She was discharged home on the following day in a stable  condition.   DISCHARGE DIAGNOSES:  1. Recent onset of angina.  2. Arteriosclerotic coronary artery disease.  3. Hypertension.  4. Hypercholesterolemia.   DISCHARGE MEDICATIONS:  1. Coated aspirin 325 mg q.d.  2. Plavix 75 mg q.d.  3. Synthroid  0.112 mg q.d.  4. Altace 2.5 mg q.d.   DISCHARGE INSTRUCTIONS:  The patient is instructed in progressive walking  and in a low-fat diet.   FOLLOW UP:  She will follow up with Dr. Swaziland in one week.  Would consider  a repeat angiography in several weeks, to follow up the patency of her right  coronary stent and possible intervention of the first marginal branch.                                                PETER Swaziland, M.D.    PJ/MEDQ  D:  02/19/2002  T:  02/21/2002  Job:  [id number]   cc:   Jeannett Senior A. Evlyn Kanner, M.D.

## 2010-11-19 NOTE — Cardiovascular Report (Signed)
NAME:  Catherine Mason, Catherine Mason                       ACCOUNT NO.:  [id number]   MEDICAL RECORD NO.:  [id number]                   PATIENT TYPE:  OIB   LOCATION:  6533                                 FACILITY:  [facility]   PHYSICIAN:  Peter M. Swaziland, M.D.               DATE OF BIRTH:  May 18, 1928   DATE OF PROCEDURE:  02/18/2002  DATE OF DISCHARGE:  02/19/2002                              CARDIAC CATHETERIZATION   INDICATIONS FOR PROCEDURE:  The patient is a 77 year old female with a  history of hypertension and hypercholesterolemia who presents with recent  onset of angina.  She had a positive stress Cardiolite study showing  evidence of inferior wall ischemia.   ACCESS:  Via the right femoral artery using standard Seldinger technique.   EQUIPMENT:  The #6 French 4-cm right and left Judkins catheters, #6 French  pigtail catheter, #7 [place] arterial sheath, #7 [place] guide with  sideholes, a 0.014 Hi-Torque Floppy wire, a 2.5 x 20-mm Maverick balloon, a  2.75 x 28-mm Zeta stent, and a 2.75 x 13-mm Zeta stent.   COMPLICATIONS:  None.   CONTRAST:  Omnipaque, 275 cc.   MEDICATIONS:  Heparin 3500 units IV, Versed 1 mg IV, nitroglycerin 200 mcg  intracoronary x4, Integrilin double bolus at 0.18 mg/kg followed by  continuous IV infusion at 2 mcg/kg per minute, Plavix 300 mg p.o.   HEMODYNAMIC DATA:  1. Aortic pressure was 151/73 with a mean of 106.  2. Left ventricular pressure was 151 with an EDP of 21 mmHg.   ANGIOGRAPHIC DATA:  1. The left coronary artery arises and distributes normally.  2. The left main coronary artery was without significant disease.  3. The left anterior descending artery has mild disease in the proximal and     mid vessel up to 30%.  4. The left circumflex coronary artery gives rise to two marginal branches.     The first marginal branch has an 80-90% stenosis involving the ostium.  5. The right coronary artery arises and distributes normally.  It is a  dominant vessel.  In the  mid vessel, there is a diffusely diseased     segment with a focal area of subtotal occlusion up to 99%.  There is only     TIMI grade I flow.  The distal right coronary does fill by left-to-right     collaterals.   LEFT VENTRICULAR ANGIOGRAPHY:  Left ventricular angiography performed in the  RAO view demonstrates normal left ventricular size with mild inferior wall  hypokinesia.  Ejection fraction is normal at 60%.  There is no mitral  regurgitation or prolapse.   INTERVENTIONAL PROCEDURE:  We proceeded at this point with intervention of  the right coronary artery.  After initial guide shots were obtained, the  patient was medicated.  We proceeded with crossing the lesion with the wire.  This was moderately difficult in crossing the subtotal occlusion.  We  then  predilated this lesion with a 2.5 x 20-mm Maverick balloon dilating up to 12  atmospheres.  We then proceeded with stenting the entire diseased segment.  The distal portion of this lesion was stented using a 2.75 x 28-mm Zeta  stent and was post dilated to 12 atmospheres.  We then covered the more  proximal segment of disease with a 2.75 x 13-mm Zeta stent in an overlapping  fashion, post dilating to 14 atmospheres.  This yielded an excellent  angiographic result throughout the entire segment with 0% residual stenosis.  There was excellent TIMI grade III flow.  It was noted that there was 40-50%  narrowing in the distal right coronary that was left untreated as this was  felt not to be hemodynamically significant.   FINAL INTERPRETATION:  1. Two-vessel obstructive atherosclerotic coronary artery disease.  2. Normal overall left ventricular function.  3. Successful stenting of the mid right coronary artery.                                               Peter M. Swaziland, M.D.    PMJ/MEDQ  D:  02/20/2002  T:  02/22/2002  Job:  [id number]   cc:   Jeannett Senior A. Evlyn Kanner, M.D.

## 2010-11-19 NOTE — H&P (Signed)
NAME:  Catherine Mason, DANG                       ACCOUNT NO.:  [id number]   MEDICAL RECORD NO.:  [id number]                   PATIENT TYPE:  OIB   LOCATION:  6533                                 FACILITY:  [facility]   PHYSICIAN:  Peter M. Swaziland, M.D.               DATE OF BIRTH:  07-17-1927   DATE OF ADMISSION:  02/18/2002  DATE OF DISCHARGE:                                HISTORY & PHYSICAL   CHIEF COMPLAINT:  Chest pain.   HISTORY OF PRESENT ILLNESS:  The patient is a 77 year old white female who  has been experiencing a one-month history of exertional chest discomfort.  She describes this predominately as a chest tightness.  She has had no  significant symptoms at rest.  She has a history of hypertension and  hypercholesterolemia.  She recently underwent a stress Cardiolite study.  She was able to walk for five minutes on the Bruce protocol and developed  her typical chest discomfort.  ECG showed subtle changes laterally.  Cardiolite images demonstrated a moderate-sized defect in the inferior wall,  consistent with ischemia.  Ejection fraction was normal at 69%.  Based on  findings of her Cardiolite study, cardiac catheterization was recommended.   PAST MEDICAL HISTORY:  The patient had history of hypertension and  hypercholesterolemia.  She has had previous tonsillectomy, appendectomy, and  removal of ovarian cysts.   ALLERGIES:  She has no known allergies.   CURRENT MEDICATIONS:  1. Aspirin 81 mg per day.  2. Ogen 0.0625 mg daily.  3. Prometrium 100 mg daily.  4. Synthroid 0.112 mg daily.  5. Altace 2.5 mg daily.   SOCIAL HISTORY:  The patient is retired.  She is widowed.  She has one  daughter.  She denies tobacco or alcohol use.   FAMILY HISTORY:  Father died at age 79 and had had previous coronary artery  bypass surgery and diabetes.  Mother died at age 71 with myocardial  infarction.  One sister is alive and well.   REVIEW OF SYSTEMS:  She has had no claudication  symptoms.  No TIA or CVA  symptoms.  No recent change in bowel or bladder habits.  All other review of  systems are negative.   PHYSICAL EXAMINATION:  GENERAL:  The patient is a pleasant white female in  no apparent distress.  VITAL SIGNS:  Weight is 172 pounds, blood pressure 130/70, pulse 80 and  regular.  HEENT:  Pupils are equal, round, and reactive.  Conjunctivae clear.  Oropharynx is clear.  NECK:  Supple without JVD, adenopathy, thyromegaly, or bruits.  LUNGS:  Clear to auscultation and percussion.  CARDIAC:  Regular rate and rhythm.  Normal S1 and S2 without gallops,  murmurs, rubs, or clicks.  ABDOMEN:  Soft, nontender.  There were no masses or hepatosplenomegaly.  EXTREMITIES:  Femoral and pedal pulses are 2+ and symmetric.  She has no  edema or cyanosis.  NEUROLOGIC:  Nonfocal.   LABORATORY DATA:  Resting ECG shows normal sinus rhythm, a normal ECG.  Chest x-ray shows no active disease.   IMPRESSION:  1. Exertional angina with abnormal stress Cardiolite study.  2. Hypertension.  3. Hypercholesterolemia.  4. Family history of heart disease.   PLAN:  The patient will be admitted for cardiac catheterization with further  therapy pending these results.                                               Peter M. Swaziland, M.D.    PMJ/MEDQ  D:  02/14/2002  T:  02/18/2002  Job:  [id number]   cc:   Jeannett Senior A. Evlyn Kanner, M.D.

## 2010-12-21 ENCOUNTER — Encounter (INDEPENDENT_AMBULATORY_CARE_PROVIDER_SITE_OTHER): Payer: Self-pay | Admitting: General Surgery

## 2011-01-18 ENCOUNTER — Encounter (INDEPENDENT_AMBULATORY_CARE_PROVIDER_SITE_OTHER): Payer: Self-pay | Admitting: General Surgery

## 2011-01-18 ENCOUNTER — Ambulatory Visit (INDEPENDENT_AMBULATORY_CARE_PROVIDER_SITE_OTHER): Payer: Medicare Other | Admitting: General Surgery

## 2011-01-18 DIAGNOSIS — T07XXXA Unspecified multiple injuries, initial encounter: Secondary | ICD-10-CM

## 2011-01-18 DIAGNOSIS — T148XXA Other injury of unspecified body region, initial encounter: Secondary | ICD-10-CM

## 2011-01-18 NOTE — Progress Notes (Signed)
HPI The patient's abdominal wound is still feeling in the lower aspect of the wound. The mesh is now about three quarters of an inch deep and a half inch wide  PE The wound is healing well with excellent granulation tissue.  Studiy review There are no studies to review.  Assessment Continuing to heal and decreasing in size abdominal wound.  Plan See the patient in 2 months. She continues to have a peristomal hernia. This is at the site of her previous stoma. I do not have past and surgically treated at this time.

## 2011-03-22 ENCOUNTER — Encounter (INDEPENDENT_AMBULATORY_CARE_PROVIDER_SITE_OTHER): Payer: Medicare Other | Admitting: General Surgery

## 2011-04-19 ENCOUNTER — Ambulatory Visit (INDEPENDENT_AMBULATORY_CARE_PROVIDER_SITE_OTHER): Payer: Medicare Other | Admitting: General Surgery

## 2011-04-19 ENCOUNTER — Encounter (INDEPENDENT_AMBULATORY_CARE_PROVIDER_SITE_OTHER): Payer: Self-pay | Admitting: General Surgery

## 2011-04-19 VITALS — BP 142/86 | HR 66 | Temp 98.4°F | Resp 14 | Ht 64.0 in | Wt 168.4 lb

## 2011-04-19 DIAGNOSIS — K439 Ventral hernia without obstruction or gangrene: Secondary | ICD-10-CM

## 2011-04-21 ENCOUNTER — Encounter (INDEPENDENT_AMBULATORY_CARE_PROVIDER_SITE_OTHER): Payer: Self-pay | Admitting: General Surgery

## 2011-04-21 DIAGNOSIS — K439 Ventral hernia without obstruction or gangrene: Secondary | ICD-10-CM | POA: Insufficient documentation

## 2011-04-21 NOTE — Progress Notes (Signed)
Ms. Hritz was in today for a long term check of her abdomen.  She has a very small opening in her lower incision, but more concerning to her is her large stomal site hernia which is minimally symptomatic.  I have advised her not to have this hernia repaired anytime soon, and if it is repaired in the future, laparoscopic repair maybe an option.

## 2011-04-21 NOTE — Patient Instructions (Signed)
Return to see me if symptoms worsen.

## 2011-07-06 DIAGNOSIS — R7401 Elevation of levels of liver transaminase levels: Secondary | ICD-10-CM | POA: Diagnosis not present

## 2011-07-06 DIAGNOSIS — E1129 Type 2 diabetes mellitus with other diabetic kidney complication: Secondary | ICD-10-CM | POA: Diagnosis not present

## 2011-07-06 DIAGNOSIS — H6120 Impacted cerumen, unspecified ear: Secondary | ICD-10-CM | POA: Diagnosis not present

## 2011-07-06 DIAGNOSIS — C55 Malignant neoplasm of uterus, part unspecified: Secondary | ICD-10-CM | POA: Diagnosis not present

## 2011-07-19 ENCOUNTER — Encounter (INDEPENDENT_AMBULATORY_CARE_PROVIDER_SITE_OTHER): Payer: Self-pay

## 2011-07-19 ENCOUNTER — Encounter (INDEPENDENT_AMBULATORY_CARE_PROVIDER_SITE_OTHER): Payer: Self-pay | Admitting: Nephrology

## 2011-09-12 ENCOUNTER — Encounter (INDEPENDENT_AMBULATORY_CARE_PROVIDER_SITE_OTHER): Payer: Self-pay | Admitting: General Surgery

## 2011-10-11 ENCOUNTER — Ambulatory Visit (INDEPENDENT_AMBULATORY_CARE_PROVIDER_SITE_OTHER): Payer: Medicare Other | Admitting: General Surgery

## 2011-10-11 ENCOUNTER — Encounter (INDEPENDENT_AMBULATORY_CARE_PROVIDER_SITE_OTHER): Payer: Self-pay | Admitting: General Surgery

## 2011-10-11 VITALS — BP 156/80 | HR 72 | Temp 99.0°F | Resp 18 | Ht 64.0 in | Wt 174.8 lb

## 2011-10-11 DIAGNOSIS — Z4800 Encounter for change or removal of nonsurgical wound dressing: Secondary | ICD-10-CM

## 2011-10-11 DIAGNOSIS — IMO0001 Reserved for inherently not codable concepts without codable children: Secondary | ICD-10-CM

## 2011-10-11 NOTE — Progress Notes (Signed)
HPI The patient is doing extremely well. Her midline wound is completely healed. Her stomal site hernia is larger and asymptomatic.  PE On examination the midline wound is completely healed. There are no dressing changes. The stomal site hernia appears to be approximately 6 cm in maximal diameter protruding bowel. There is no impingement no tenderness and is easily reducible  Studiy review There are currently no studies to review.  Assessment Doing well status post multiple operations for perforated bowel colectomy with colostomy. Her midline wound is completely healed. She does have a stomal site hernia which is completely asymptomatic.  Plan The patient is to call me if she has any problems with hernia currently she's doing well enough and so asymptomatic that surgery is not necessary. She is an abdominal binder or the patch gurgles that she is currently using her return to see me on a p.r.n. basis.

## 2011-11-07 DIAGNOSIS — N058 Unspecified nephritic syndrome with other morphologic changes: Secondary | ICD-10-CM | POA: Diagnosis not present

## 2011-11-07 DIAGNOSIS — E1129 Type 2 diabetes mellitus with other diabetic kidney complication: Secondary | ICD-10-CM | POA: Diagnosis not present

## 2011-11-07 DIAGNOSIS — E049 Nontoxic goiter, unspecified: Secondary | ICD-10-CM | POA: Diagnosis not present

## 2011-11-07 DIAGNOSIS — E785 Hyperlipidemia, unspecified: Secondary | ICD-10-CM | POA: Diagnosis not present

## 2011-12-27 ENCOUNTER — Encounter: Payer: Self-pay | Admitting: Cardiology

## 2011-12-27 ENCOUNTER — Ambulatory Visit (INDEPENDENT_AMBULATORY_CARE_PROVIDER_SITE_OTHER): Payer: Medicare Other | Admitting: Cardiology

## 2011-12-27 VITALS — BP 162/70 | HR 66 | Ht 60.0 in | Wt 175.0 lb

## 2011-12-27 DIAGNOSIS — I498 Other specified cardiac arrhythmias: Secondary | ICD-10-CM | POA: Diagnosis not present

## 2011-12-27 DIAGNOSIS — E785 Hyperlipidemia, unspecified: Secondary | ICD-10-CM | POA: Diagnosis not present

## 2011-12-27 DIAGNOSIS — I2510 Atherosclerotic heart disease of native coronary artery without angina pectoris: Secondary | ICD-10-CM | POA: Insufficient documentation

## 2011-12-27 DIAGNOSIS — I10 Essential (primary) hypertension: Secondary | ICD-10-CM | POA: Diagnosis not present

## 2011-12-27 DIAGNOSIS — N183 Chronic kidney disease, stage 3 unspecified: Secondary | ICD-10-CM | POA: Insufficient documentation

## 2011-12-27 DIAGNOSIS — I471 Supraventricular tachycardia: Secondary | ICD-10-CM | POA: Insufficient documentation

## 2011-12-27 MED ORDER — AMLODIPINE BESYLATE 5 MG PO TABS: 5.0000 mg | ORAL_TABLET | Freq: Every day | ORAL | Status: DC

## 2011-12-27 NOTE — Assessment & Plan Note (Signed)
Well controlled on metoprolol.

## 2011-12-27 NOTE — Assessment & Plan Note (Signed)
No clinical symptoms of angina. She had remote stenting of the right coronary in 2003 with bare metal stents. Myoview study in November 2011 was normal.

## 2011-12-27 NOTE — Progress Notes (Signed)
Catherine Mason Date of Birth: 04/26/1928 Medical Record #[id number]  History of Present Illness: Catherine Mason is seen today for followup. He was last seen 2 years ago. She has a history of coronary disease and has had stenting of the right coronary in 2003 with overlapping bare-metal stents. This includes a 2.75 x 28 and 2.75 x 13 mm Zeta stents. 2 years ago she underwent a hysterectomy. Sometime after this she had acute rupture of her colon and had to undergo acute abdominal surgery. She was septic. Ejection fraction during that hospitalization showed her ejection fraction had dropped to 30%. On subsequent evaluation 6 months later she had a stress Myoview study which showed no perfusion abnormality and her ejection fraction had recovered and was normal at 86%. In February 2012 she had reversal of her colostomy. She has had a persistent abdominal hernia at the stoma site. She is still weak from all of this. She is able to walk with a cane. She is also followed by nephrology for stage III chronic kidney disease. From a cardiac standpoint she has done very well. She has had no chest pain. She has a prior history of supraventricular tachycardia but has had no episodes on metoprolol. She reports her blood pressure has been doing well.  Current Outpatient Prescriptions on File Prior to Visit  Medication Sig Dispense Refill  . fenofibrate 160 MG tablet Take 160 mg by mouth daily.       Marland Kitchen glimepiride (AMARYL) 2 MG tablet Take 2 mg by mouth.       . Levothyroxine Sodium (SYNTHROID PO) Take 112 mcg by mouth daily.       . metoprolol succinate (TOPROL-XL) 50 MG 24 hr tablet Take 50 mg by mouth 2 (two) times daily. Take with or immediately following a meal.      . amLODipine (NORVASC) 5 MG tablet Take 1 tablet (5 mg total) by mouth daily.  30 tablet  11    No Known Allergies  Past Medical History  Diagnosis Date  . Diabetes mellitus   . Thyroid disease   . Uterine cancer   . Ventral hernia   . CAD  (coronary artery disease)     PCI 2003 2.75x13, 2.75x28 Zeta stents mid RCA  . HTN (hypertension)   . Hyperlipidemia   . SVT (supraventricular tachycardia)   . Diverticular disease   . Abdominal wall hernia at previous stoma site   . CKD (chronic kidney disease), stage III   . Hypothyroid   . Anemia     Past Surgical History  Procedure Date  . Abdominal hysterectomy   . Coronary stents   . Colon surgery     colectomy  . Colostomy reversal.     History  Smoking status  . Never Smoker   Smokeless tobacco  . Never Used    History  Alcohol Use No    Family History  Problem Relation Age of Onset  . Heart disease Mother     heart attack  . Cancer Father     prostate  . Diabetes Father     Review of Systems: The review of systems is positive for chronic anemia and diabetes.  She has a history of hypothyroidism and is on replacement. Her blood pressure has been running high. She monitors this at home he typically gets systolic readings in the 150s. All other systems were reviewed and are negative.  Physical Exam: BP 162/70  Pulse 66  Ht 5' (1.524 m)  Wt  175 lb (79.379 kg)  BMI 34.18 kg/m2 She is a pleasant, obese white female in no acute distress. Her HEENT exam is unremarkable. Pupils are equal round and reactive to light accommodation. Extraocular movements are full. Oropharynx is clear. Neck is supple without JVD, adenopathy, thyromegaly, or bruits. Lungs are clear. Cardiac exam reveals a regular rate and rhythm without gallop, murmur, or click. Abdomen is soft and nontender. She has no hepatosplenomegaly. She does have a large abdominal wall hernia in the left abdomen that her prior stoma site. It is easily reducible. Extremities are without edema. She has no cyanosis. Pedal pulses are good. She is alert oriented x3. She has no focal neurologic findings. LABORATORY DATA: ECG today is normal.  Assessment / Plan:

## 2011-12-27 NOTE — Assessment & Plan Note (Signed)
Blood pressures not adequately controlled especially in light of her diabetes. I recommended the addition of amlodipine 5 mg daily to her Toprol. She is going to monitor her blood pressure more closely.

## 2011-12-27 NOTE — Patient Instructions (Signed)
We are going to add amlodipine 5 mg daily for your blood pressure. The most common side effect is ankle swelling and headache. Watch your blood pressure closely. We would like to see the top number less than 130 and the bottom number less than 80.   Continue your other medication  I will see you again in one year.

## 2012-03-09 DIAGNOSIS — N058 Unspecified nephritic syndrome with other morphologic changes: Secondary | ICD-10-CM | POA: Diagnosis not present

## 2012-03-09 DIAGNOSIS — I2510 Atherosclerotic heart disease of native coronary artery without angina pectoris: Secondary | ICD-10-CM | POA: Diagnosis not present

## 2012-03-09 DIAGNOSIS — I10 Essential (primary) hypertension: Secondary | ICD-10-CM | POA: Diagnosis not present

## 2012-03-09 DIAGNOSIS — E1129 Type 2 diabetes mellitus with other diabetic kidney complication: Secondary | ICD-10-CM | POA: Diagnosis not present

## 2012-03-28 DIAGNOSIS — IMO0001 Reserved for inherently not codable concepts without codable children: Secondary | ICD-10-CM | POA: Diagnosis not present

## 2012-03-28 DIAGNOSIS — H409 Unspecified glaucoma: Secondary | ICD-10-CM | POA: Diagnosis not present

## 2012-03-28 DIAGNOSIS — H52229 Regular astigmatism, unspecified eye: Secondary | ICD-10-CM | POA: Diagnosis not present

## 2012-03-28 DIAGNOSIS — H4011X0 Primary open-angle glaucoma, stage unspecified: Secondary | ICD-10-CM | POA: Diagnosis not present

## 2012-06-05 DIAGNOSIS — N183 Chronic kidney disease, stage 3 unspecified: Secondary | ICD-10-CM | POA: Diagnosis not present

## 2012-06-08 DIAGNOSIS — N390 Urinary tract infection, site not specified: Secondary | ICD-10-CM | POA: Diagnosis not present

## 2012-06-08 DIAGNOSIS — N183 Chronic kidney disease, stage 3 unspecified: Secondary | ICD-10-CM | POA: Diagnosis not present

## 2012-06-11 DIAGNOSIS — Z23 Encounter for immunization: Secondary | ICD-10-CM | POA: Diagnosis not present

## 2012-06-11 DIAGNOSIS — E049 Nontoxic goiter, unspecified: Secondary | ICD-10-CM | POA: Diagnosis not present

## 2012-06-11 DIAGNOSIS — E785 Hyperlipidemia, unspecified: Secondary | ICD-10-CM | POA: Diagnosis not present

## 2012-06-11 DIAGNOSIS — R209 Unspecified disturbances of skin sensation: Secondary | ICD-10-CM | POA: Diagnosis not present

## 2012-06-11 DIAGNOSIS — E1129 Type 2 diabetes mellitus with other diabetic kidney complication: Secondary | ICD-10-CM | POA: Diagnosis not present

## 2012-08-07 ENCOUNTER — Telehealth: Payer: Self-pay | Admitting: Cardiology

## 2012-08-07 NOTE — Telephone Encounter (Signed)
New problem   90 days supply   amlodipine 5 mg.   [facility] [place]  [phone].

## 2012-08-08 ENCOUNTER — Other Ambulatory Visit: Payer: Self-pay

## 2012-08-08 MED ORDER — AMLODIPINE BESYLATE 5 MG PO TABS: 5.0000 mg | ORAL_TABLET | Freq: Every day | ORAL | Status: DC

## 2012-10-10 DIAGNOSIS — E785 Hyperlipidemia, unspecified: Secondary | ICD-10-CM | POA: Diagnosis not present

## 2012-10-10 DIAGNOSIS — N058 Unspecified nephritic syndrome with other morphologic changes: Secondary | ICD-10-CM | POA: Diagnosis not present

## 2012-10-10 DIAGNOSIS — E1129 Type 2 diabetes mellitus with other diabetic kidney complication: Secondary | ICD-10-CM | POA: Diagnosis not present

## 2012-10-10 DIAGNOSIS — I10 Essential (primary) hypertension: Secondary | ICD-10-CM | POA: Diagnosis not present

## 2012-10-10 DIAGNOSIS — I2510 Atherosclerotic heart disease of native coronary artery without angina pectoris: Secondary | ICD-10-CM | POA: Diagnosis not present

## 2012-10-10 DIAGNOSIS — H409 Unspecified glaucoma: Secondary | ICD-10-CM | POA: Diagnosis not present

## 2012-10-10 DIAGNOSIS — E049 Nontoxic goiter, unspecified: Secondary | ICD-10-CM | POA: Diagnosis not present

## 2012-10-10 DIAGNOSIS — E039 Hypothyroidism, unspecified: Secondary | ICD-10-CM | POA: Diagnosis not present

## 2013-01-01 DIAGNOSIS — I214 Non-ST elevation (NSTEMI) myocardial infarction: Secondary | ICD-10-CM

## 2013-01-01 HISTORY — DX: Non-ST elevation (NSTEMI) myocardial infarction: I21.4

## 2013-01-12 ENCOUNTER — Encounter (HOSPITAL_COMMUNITY): Payer: Self-pay | Admitting: Emergency Medicine

## 2013-01-12 ENCOUNTER — Emergency Department (HOSPITAL_COMMUNITY): Payer: Medicare Other

## 2013-01-12 ENCOUNTER — Inpatient Hospital Stay (HOSPITAL_COMMUNITY)
Admission: EM | Admit: 2013-01-12 | Discharge: 2013-01-15 | DRG: 247 | Disposition: A | Discharge status: Home / Self Care | Payer: Medicare Other | Attending: Cardiology | Admitting: Cardiology

## 2013-01-12 DIAGNOSIS — E119 Type 2 diabetes mellitus without complications: Secondary | ICD-10-CM | POA: Diagnosis not present

## 2013-01-12 DIAGNOSIS — E785 Hyperlipidemia, unspecified: Secondary | ICD-10-CM | POA: Diagnosis present

## 2013-01-12 DIAGNOSIS — Z9861 Coronary angioplasty status: Secondary | ICD-10-CM | POA: Diagnosis not present

## 2013-01-12 DIAGNOSIS — Z833 Family history of diabetes mellitus: Secondary | ICD-10-CM | POA: Diagnosis not present

## 2013-01-12 DIAGNOSIS — Z7982 Long term (current) use of aspirin: Secondary | ICD-10-CM | POA: Diagnosis not present

## 2013-01-12 DIAGNOSIS — I2510 Atherosclerotic heart disease of native coronary artery without angina pectoris: Secondary | ICD-10-CM | POA: Diagnosis not present

## 2013-01-12 DIAGNOSIS — I214 Non-ST elevation (NSTEMI) myocardial infarction: Principal | ICD-10-CM | POA: Diagnosis present

## 2013-01-12 DIAGNOSIS — Z79899 Other long term (current) drug therapy: Secondary | ICD-10-CM

## 2013-01-12 DIAGNOSIS — Z7902 Long term (current) use of antithrombotics/antiplatelets: Secondary | ICD-10-CM

## 2013-01-12 DIAGNOSIS — N183 Chronic kidney disease, stage 3 unspecified: Secondary | ICD-10-CM | POA: Diagnosis not present

## 2013-01-12 DIAGNOSIS — Z91013 Allergy to seafood: Secondary | ICD-10-CM | POA: Diagnosis not present

## 2013-01-12 DIAGNOSIS — K439 Ventral hernia without obstruction or gangrene: Secondary | ICD-10-CM | POA: Diagnosis not present

## 2013-01-12 DIAGNOSIS — I200 Unstable angina: Secondary | ICD-10-CM | POA: Diagnosis not present

## 2013-01-12 DIAGNOSIS — R109 Unspecified abdominal pain: Secondary | ICD-10-CM | POA: Diagnosis not present

## 2013-01-12 DIAGNOSIS — R072 Precordial pain: Secondary | ICD-10-CM | POA: Diagnosis not present

## 2013-01-12 DIAGNOSIS — I2589 Other forms of chronic ischemic heart disease: Secondary | ICD-10-CM | POA: Diagnosis present

## 2013-01-12 DIAGNOSIS — I255 Ischemic cardiomyopathy: Secondary | ICD-10-CM

## 2013-01-12 DIAGNOSIS — Z8249 Family history of ischemic heart disease and other diseases of the circulatory system: Secondary | ICD-10-CM | POA: Diagnosis not present

## 2013-01-12 DIAGNOSIS — I129 Hypertensive chronic kidney disease with stage 1 through stage 4 chronic kidney disease, or unspecified chronic kidney disease: Secondary | ICD-10-CM | POA: Diagnosis present

## 2013-01-12 DIAGNOSIS — Z8542 Personal history of malignant neoplasm of other parts of uterus: Secondary | ICD-10-CM | POA: Diagnosis not present

## 2013-01-12 DIAGNOSIS — I10 Essential (primary) hypertension: Secondary | ICD-10-CM

## 2013-01-12 DIAGNOSIS — I059 Rheumatic mitral valve disease, unspecified: Secondary | ICD-10-CM | POA: Diagnosis not present

## 2013-01-12 DIAGNOSIS — E039 Hypothyroidism, unspecified: Secondary | ICD-10-CM | POA: Diagnosis present

## 2013-01-12 DIAGNOSIS — R079 Chest pain, unspecified: Secondary | ICD-10-CM

## 2013-01-12 DIAGNOSIS — I498 Other specified cardiac arrhythmias: Secondary | ICD-10-CM | POA: Diagnosis present

## 2013-01-12 LAB — CBC WITH DIFFERENTIAL/PLATELET
Basophils Relative: 0 % (ref 0–1)
Eosinophils Absolute: 0.1 10*3/uL (ref 0.0–0.7)
Eosinophils Relative: 1 % (ref 0–5)
HCT: 36.9 % (ref 36.0–46.0)
Hemoglobin: 12.5 g/dL (ref 12.0–15.0)
MCH: 30.9 pg (ref 26.0–34.0)
MCHC: 33.9 g/dL (ref 30.0–36.0)
Monocytes Absolute: 0.6 10*3/uL (ref 0.1–1.0)
Monocytes Relative: 9 % (ref 3–12)

## 2013-01-12 LAB — COMPREHENSIVE METABOLIC PANEL
Albumin: 3.7 g/dL (ref 3.5–5.2)
BUN: 32 mg/dL — ABNORMAL HIGH (ref 6–23)
Calcium: 9.6 mg/dL (ref 8.4–10.5)
Creatinine, Ser: 2.01 mg/dL — ABNORMAL HIGH (ref 0.50–1.10)
Total Bilirubin: 0.3 mg/dL (ref 0.3–1.2)
Total Protein: 7.1 g/dL (ref 6.0–8.3)

## 2013-01-12 LAB — POCT I-STAT TROPONIN I: Troponin i, poc: 2.05 ng/mL (ref 0.00–0.08)

## 2013-01-12 LAB — GLUCOSE, CAPILLARY

## 2013-01-12 LAB — POCT I-STAT, CHEM 8
BUN: 30 mg/dL — ABNORMAL HIGH (ref 6–23)
Hemoglobin: 14.3 g/dL (ref 12.0–15.0)
Sodium: 138 mEq/L (ref 135–145)
TCO2: 23 mmol/L (ref 0–100)

## 2013-01-12 IMAGING — CR DG ABDOMEN 1V
1 series · 1 of 1 positions shown · non-contrast
Comparison: Barium enema [DATE].  CT abdomen and pelvis
[DATE].

CLINICAL DATA: Abdominal pain.  Chest pain.  Previous history of
colon rupture, hernia, colostomy, and hysterectomy.

ABDOMEN - 1 VIEW

[t abdomen supine]
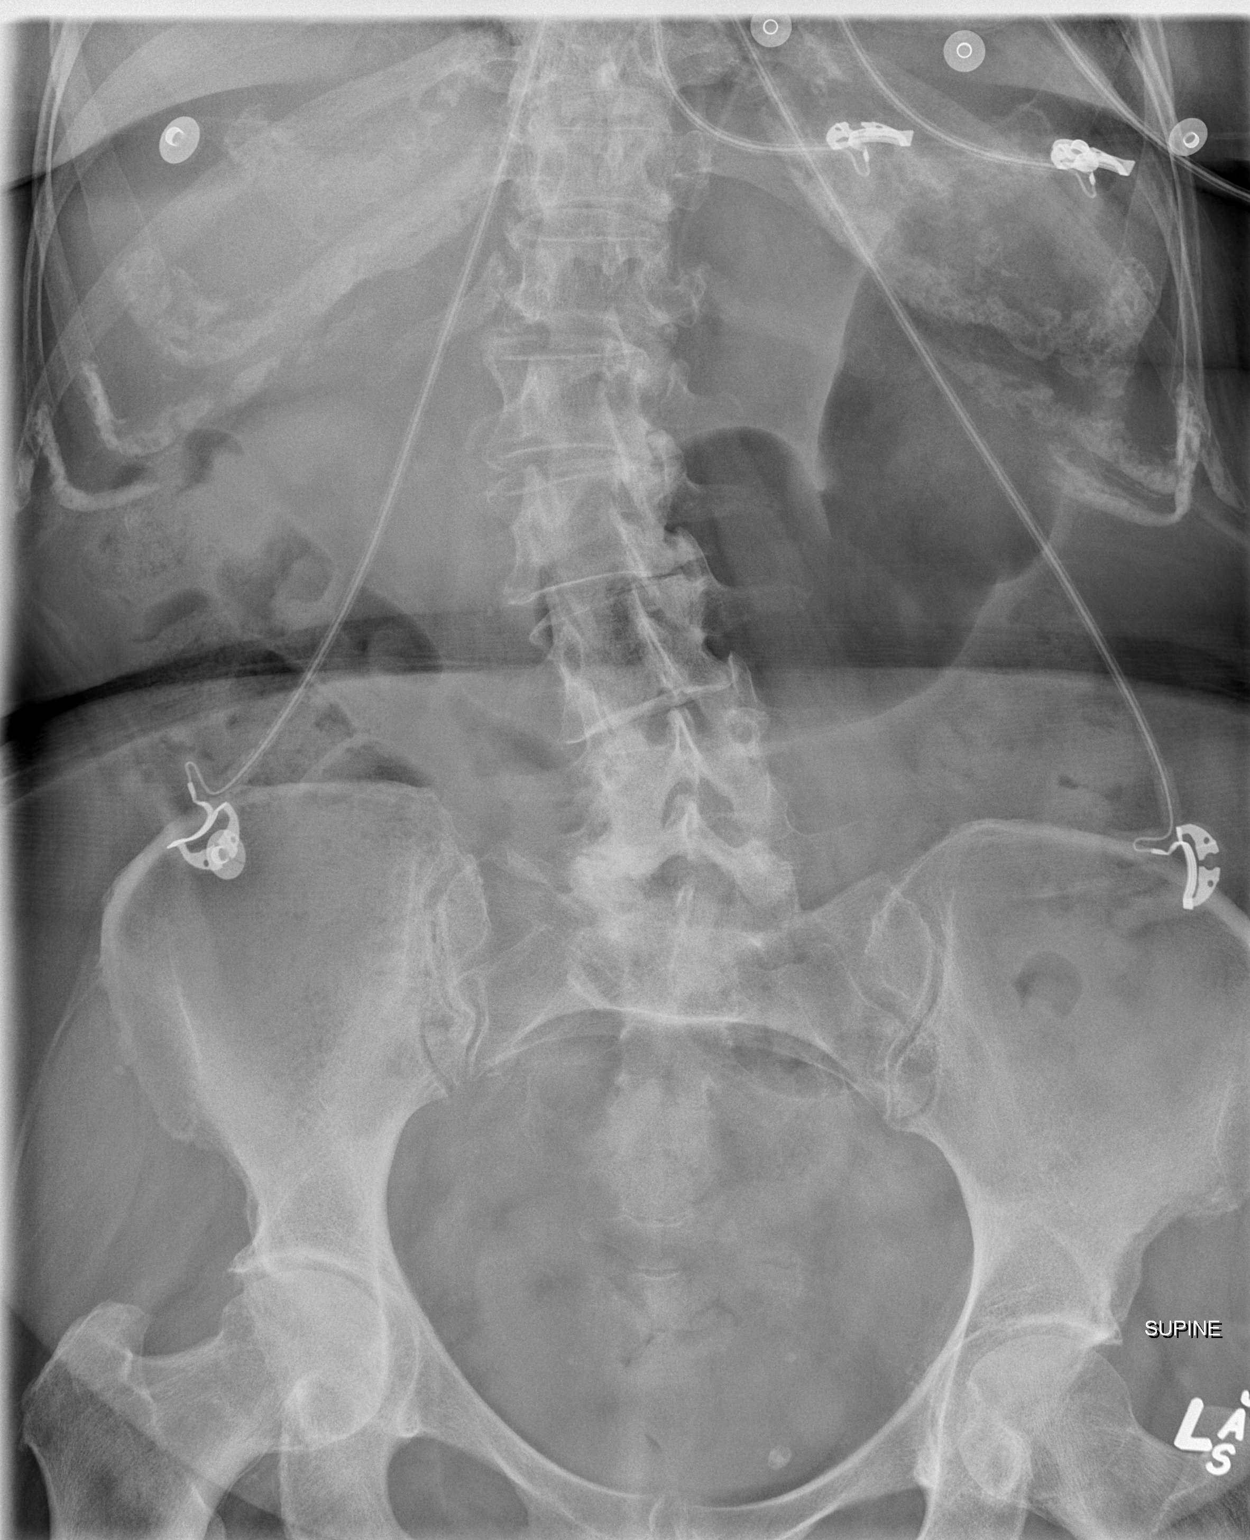

[1 of 1 positions shown; findings below may reference images not displayed]

FINDINGS: Stool filled colon.  No small or large bowel distension.
Stomach bubble is prominent but likely within normal limits.  No
radiopaque stones.  Calcified phleboliths in the pelvis.
Degenerative changes and scoliosis of the lumbar spine.
Degenerative changes in the hips.
IMPRESSION: Stool filled colon.  Nonobstructive bowel gas pattern.

## 2013-01-12 IMAGING — CR DG CHEST 2V
2 series · 2 of 2 positions shown · non-contrast
Comparison: [DATE]

CLINICAL DATA: Chest pain.  Hypertension.  Diabetes.  Nonsmoker.

CHEST - 2 VIEW

[w chest pa]
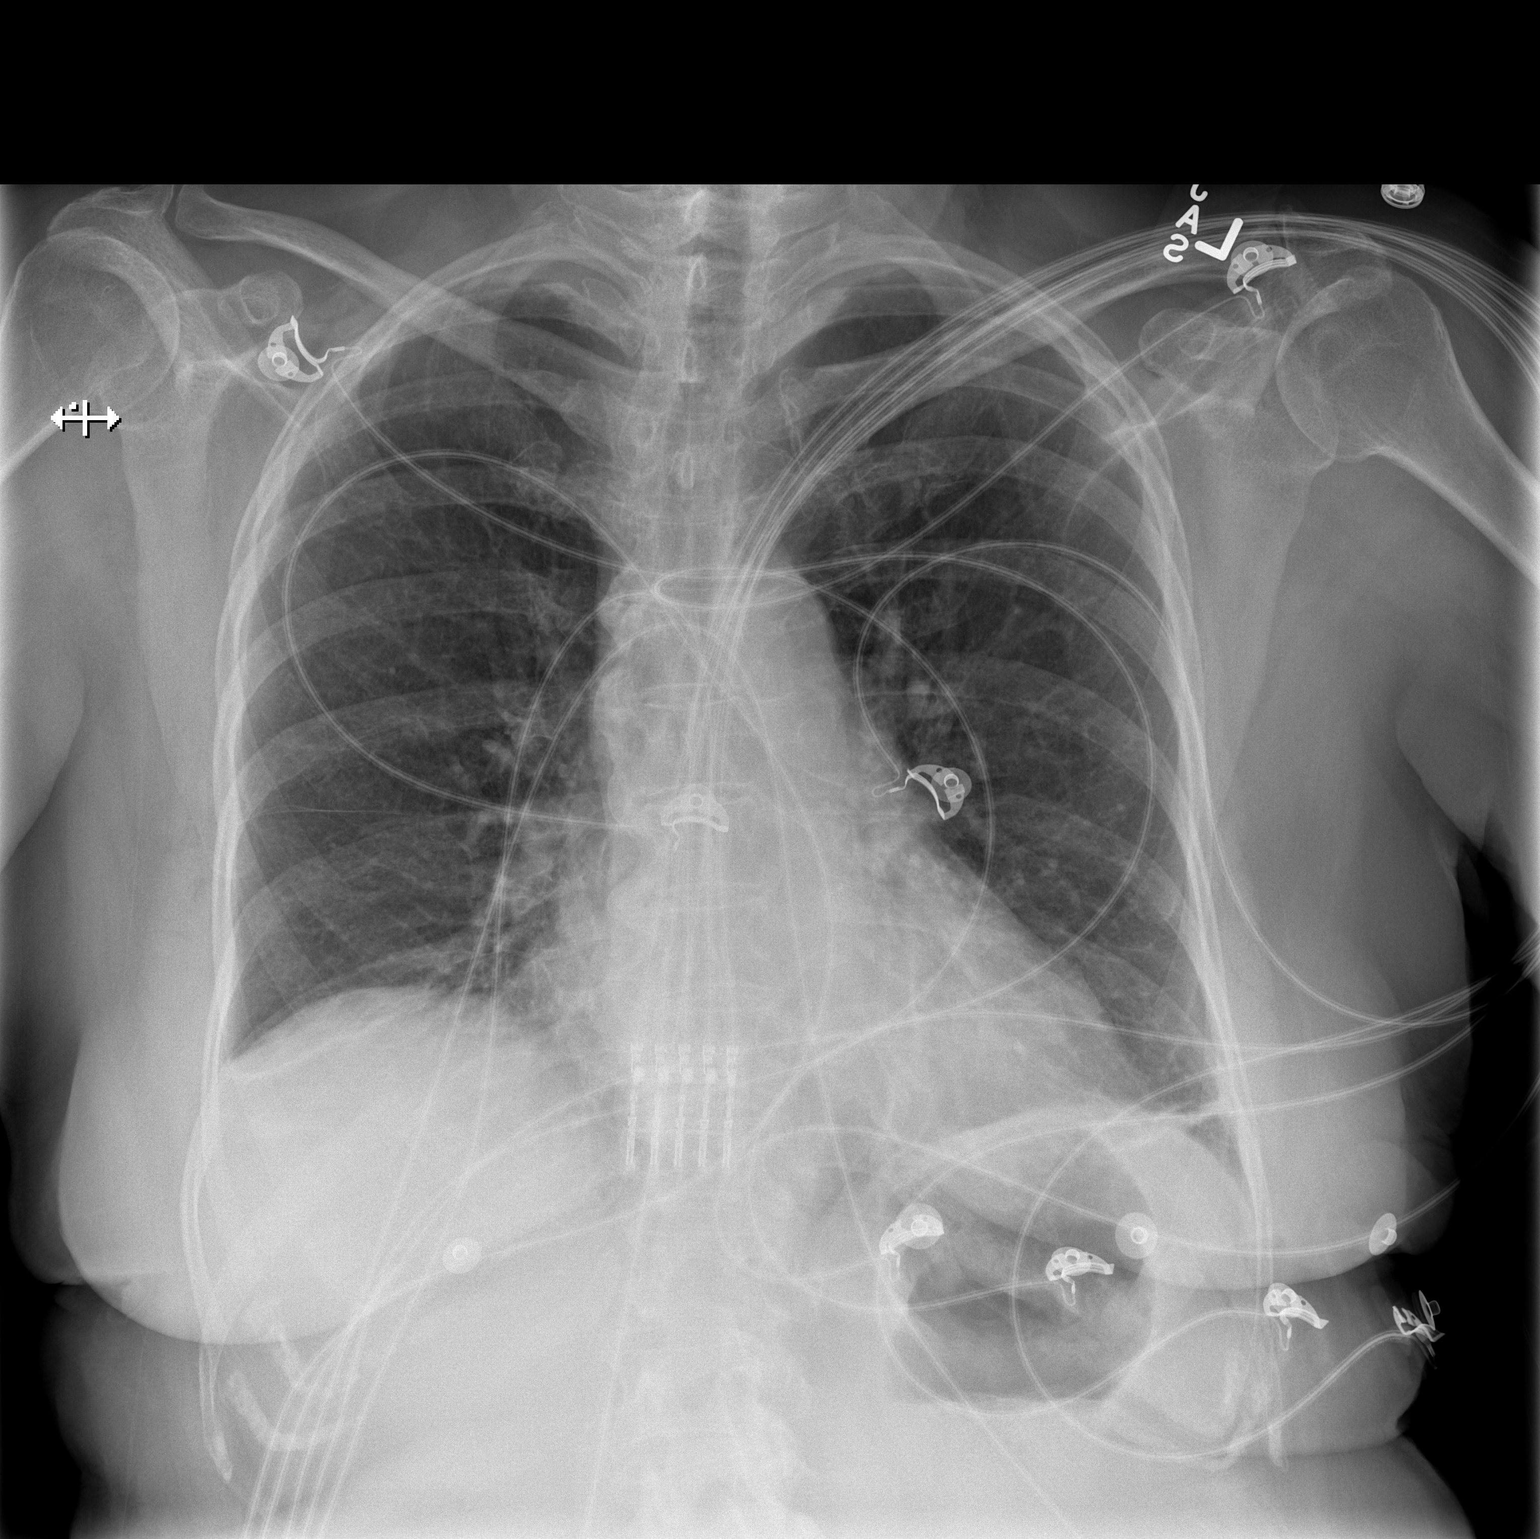

[w chest lat]
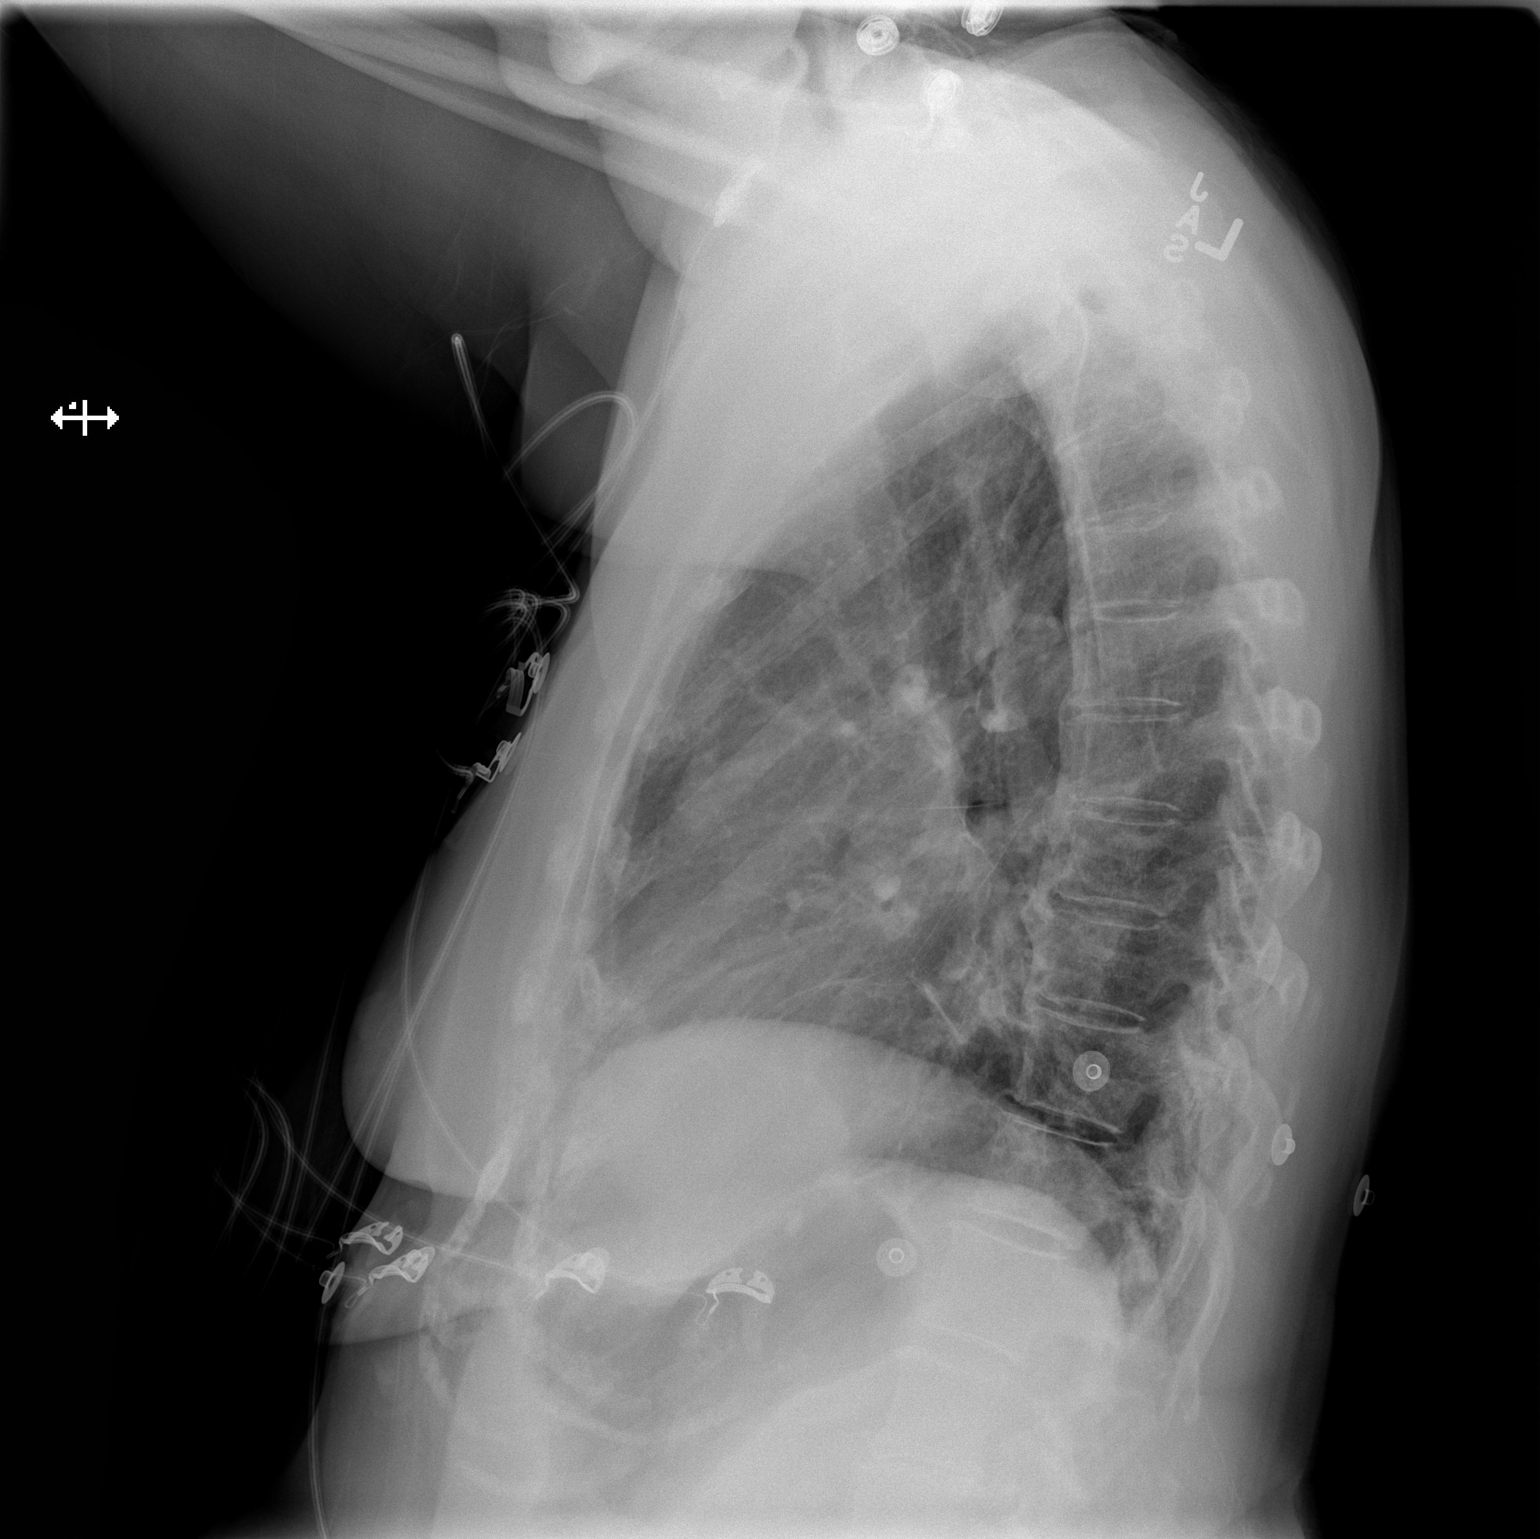

[2 of 2 positions shown; findings below may reference images not displayed]

FINDINGS: Atelectasis or fibrosis in the lung bases.  Normal heart
size and pulmonary vascularity.  No focal consolidation or airspace
disease.  No blunting of costophrenic angles.  No pneumothorax.
Degenerative changes in the spine.  No significant change since
previous study.
IMPRESSION: Fibrosis or atelectasis in the lung bases.  No evidence of active
consolidation.

## 2013-01-12 MED ORDER — INSULIN ASPART 100 UNIT/ML ~~LOC~~ SOLN
10.0000 [IU] | Freq: Once | SUBCUTANEOUS | Status: AC
  Administered 2013-01-12: 10 [IU] via INTRAVENOUS
  Filled 2013-01-12: qty 1

## 2013-01-12 MED ORDER — HEPARIN BOLUS VIA INFUSION
4000.0000 [IU] | Freq: Once | INTRAVENOUS | Status: AC
  Administered 2013-01-12: 4000 [IU] via INTRAVENOUS

## 2013-01-12 MED ORDER — HEPARIN (PORCINE) IN NACL 100-0.45 UNIT/ML-% IJ SOLN
1050.0000 [IU]/h | INTRAMUSCULAR | Status: DC
  Administered 2013-01-12 – 2013-01-13 (×2): 850 [IU]/h via INTRAVENOUS
  Administered 2013-01-14: 1050 [IU]/h via INTRAVENOUS
  Filled 2013-01-12 (×3): qty 250

## 2013-01-12 MED ORDER — SODIUM CHLORIDE 0.9 % IV BOLUS (SEPSIS)
1000.0000 mL | Freq: Once | INTRAVENOUS | Status: AC
  Administered 2013-01-12: 1000 mL via INTRAVENOUS

## 2013-01-12 NOTE — ED Notes (Signed)
Pt's CBG is 276 mg/dl.RN Fayrene Fearing notified.

## 2013-01-12 NOTE — ED Notes (Signed)
Pt and family asking when cardiology is going to see pt. RN followed up with MD, cardiology to see pt upstairs. Pt notified.

## 2013-01-12 NOTE — ED Notes (Signed)
Received Heparin from Pharmacy. Pt in radiology at this time.

## 2013-01-12 NOTE — ED Notes (Signed)
Per EMS, Pt started having CP this evening in her central chest radiating to her back. Pt took 324 asa at home. Pt has had poor intake. CBG 387. Pt also has taken 3 antiacids. Pt has hx of Mi with 2 stents placed. Denies n/v and SOB. Pt has had 2 nitro and reports pain from 9/10 to 5/10. BP 133/82 HR 99. A/o x4.

## 2013-01-12 NOTE — ED Notes (Signed)
Pt transported to XR.  

## 2013-01-12 NOTE — H&P (Signed)
Catherine Mason is an 79 y.o. female.   Chief Complaint: Chest Pain  HPI: Catherine Mason is an 79 yo woman followed by Dr. Swaziland with T2DM, remote RCA stenting in 2003, chronic kidney disease, prior history of SVT controlled with metoprolol, T2DM, last known EF 30% by 5/11 Echo but 2011 myoview with normal EF read who presents with substernal chest pain she initially thought was GERD with burning and tightness sensation. It gradually went away. She was eating ribs this evening and thought she had reflux and attempted to take aspirin without relief leading to EMS call and 324 mg aspirin given in total along with 2 SL NTG with pain resolution. She characterizes her pain as similar to chest pain leading to her prior stenting. She is well known to Dr. Swaziland and last saw him 6/13. She has had hysterectomy and had a perforated sigmoid diverticulitis with colostomy and then colostomy reversal in 2/12.  We discussed that we need more information from trending her cardiac enzymes and looking at her EF to best risk stratify her before deciding if LHC is necessary. She knows Dr. Swaziland very well and looks forward to discussing risks/benefits as more information arises. In the ER she was noted to have a troponin of 2.0 and heparin gtt initiated.   Past Medical History  Diagnosis Date  . Diabetes mellitus   . Thyroid disease   . Uterine cancer   . Ventral hernia   . CAD (coronary artery disease)     PCI 2003 2.75x13, 2.75x28 Zeta stents mid RCA  . HTN (hypertension)   . Hyperlipidemia   . SVT (supraventricular tachycardia)   . Diverticular disease   . Abdominal wall hernia at previous stoma site   . CKD (chronic kidney disease), stage III   . Hypothyroid   . Anemia     Past Surgical History  Procedure Laterality Date  . Abdominal hysterectomy    . Coronary stents    . Colon surgery      colectomy  . Colostomy reversal.      Family History  Problem Relation Age of Onset  . Heart disease Mother     heart attack  . Cancer Father     prostate  . Diabetes Father    Social History:  reports that she has never smoked. She has never used smokeless tobacco. She reports that she does not drink alcohol or use illicit drugs.  Allergies:  Allergies  Allergen Reactions  . Shellfish Allergy Diarrhea     (Not in a hospital admission)  Results for orders placed during the hospital encounter of 01/12/13 (from the past 48 hour(s))  CBC WITH DIFFERENTIAL     Status: None   Collection Time    01/12/13  8:00 PM      Result Value Range   WBC 7.2  4.0 - 10.5 K/uL   RBC 4.04  3.87 - 5.11 MIL/uL   Hemoglobin 12.5  12.0 - 15.0 g/dL   HCT 08.6  57.8 - 46.9 %   MCV 91.3  78.0 - 100.0 fL   MCH 30.9  26.0 - 34.0 pg   MCHC 33.9  30.0 - 36.0 g/dL   RDW 62.9  52.8 - 41.3 %   Platelets 175  150 - 400 K/uL   Neutrophils Relative % 76  43 - 77 %   Neutro Abs 5.5  1.7 - 7.7 K/uL   Lymphocytes Relative 13  12 - 46 %   Lymphs Abs 1.0  0.7 - 4.0 K/uL   Monocytes Relative 9  3 - 12 %   Monocytes Absolute 0.6  0.1 - 1.0 K/uL   Eosinophils Relative 1  0 - 5 %   Eosinophils Absolute 0.1  0.0 - 0.7 K/uL   Basophils Relative 0  0 - 1 %   Basophils Absolute 0.0  0.0 - 0.1 K/uL  COMPREHENSIVE METABOLIC PANEL     Status: Abnormal   Collection Time    01/12/13  8:00 PM      Result Value Range   Sodium 134 (*) 135 - 145 mEq/L   Potassium 4.1  3.5 - 5.1 mEq/L   Chloride 100  96 - 112 mEq/L   CO2 22  19 - 32 mEq/L   Glucose, Bld 409 (*) 70 - 99 mg/dL   BUN 32 (*) 6 - 23 mg/dL   Creatinine, Ser 1.61 (*) 0.50 - 1.10 mg/dL   Calcium 9.6  8.4 - 09.6 mg/dL   Total Protein 7.1  6.0 - 8.3 g/dL   Albumin 3.7  3.5 - 5.2 g/dL   AST 46 (*) 0 - 37 U/L   ALT 24  0 - 35 U/L   Alkaline Phosphatase 62  39 - 117 U/L   Total Bilirubin 0.3  0.3 - 1.2 mg/dL   GFR calc non Af Amer 22 (*) >90 mL/min   GFR calc Af Amer 25 (*) >90 mL/min   Comment:            The eGFR has been calculated     using the CKD EPI equation.      This calculation has not been     validated in all clinical     situations.     eGFR's persistently     <90 mL/min signify     possible Chronic Kidney Disease.  POCT I-STAT TROPONIN I     Status: Abnormal   Collection Time    01/12/13  8:16 PM      Result Value Range   Troponin i, poc 2.05 (*) 0.00 - 0.08 ng/mL   Comment NOTIFIED PHYSICIAN     Comment 3            Comment: Due to the release kinetics of cTnI,     a negative result within the first hours     of the onset of symptoms does not rule out     myocardial infarction with certainty.     If myocardial infarction is still suspected,     repeat the test at appropriate intervals.  POCT I-STAT, CHEM 8     Status: Abnormal   Collection Time    01/12/13  9:03 PM      Result Value Range   Sodium 138  135 - 145 mEq/L   Potassium 4.6  3.5 - 5.1 mEq/L   Chloride 105  96 - 112 mEq/L   BUN 30 (*) 6 - 23 mg/dL   Creatinine, Ser 0.45 (*) 0.50 - 1.10 mg/dL   Glucose, Bld 409 (*) 70 - 99 mg/dL   Calcium, Ion 8.11  9.14 - 1.30 mmol/L   TCO2 23  0 - 100 mmol/L   Hemoglobin 14.3  12.0 - 15.0 g/dL   HCT 78.2  95.6 - 21.3 %  GLUCOSE, CAPILLARY     Status: Abnormal   Collection Time    01/12/13 10:52 PM      Result Value Range   Glucose-Capillary 276 (*) 70 - 99 mg/dL  Comment 1 Documented in Chart     Comment 2 Notify RN     Dg Chest 2 View  01/12/2013   *RADIOLOGY REPORT*  Clinical Data: Chest pain.  Hypertension.  Diabetes.  Nonsmoker.  CHEST - 2 VIEW  Comparison: 07/20/2010  Findings: Atelectasis or fibrosis in the lung bases.  Normal heart size and pulmonary vascularity.  No focal consolidation or airspace disease.  No blunting of costophrenic angles.  No pneumothorax. Degenerative changes in the spine.  No significant change since previous study.  IMPRESSION: Fibrosis or atelectasis in the lung bases.  No evidence of active consolidation.   Original Report Authenticated By: Burman Nieves, M.D.   Dg Abd 1 View  01/12/2013    *RADIOLOGY REPORT*  Clinical Data: Abdominal pain.  Chest pain.  Previous history of colon rupture, hernia, colostomy, and hysterectomy.  ABDOMEN - 1 VIEW  Comparison: Barium enema 05/03/2010.  CT abdomen and pelvis 11/05/2009.  Findings: Stool filled colon.  No small or large bowel distension. Stomach bubble is prominent but likely within normal limits.  No radiopaque stones.  Calcified phleboliths in the pelvis. Degenerative changes and scoliosis of the lumbar spine. Degenerative changes in the hips.  IMPRESSION: Stool filled colon.  Nonobstructive bowel gas pattern.   Original Report Authenticated By: Burman Nieves, M.D.    Review of Systems  Constitutional: Negative for fever and chills.  HENT: Positive for hearing loss. Negative for ear pain and neck pain.   Eyes: Negative for double vision, photophobia and pain.  Respiratory: Negative for cough and hemoptysis.   Cardiovascular: Positive for chest pain and palpitations. Negative for orthopnea and leg swelling.  Gastrointestinal: Positive for heartburn. Negative for nausea, vomiting and abdominal pain.  Genitourinary: Negative for dysuria and urgency.  Musculoskeletal: Negative for myalgias.  Skin: Negative for itching and rash.  Neurological: Negative for dizziness, tingling, tremors and headaches.  Endo/Heme/Allergies: Negative for environmental allergies. Does not bruise/bleed easily.  Psychiatric/Behavioral: Negative for depression, suicidal ideas and substance abuse.    Blood pressure 133/50, pulse 74, temperature 98.3 F (36.8 C), temperature source Oral, resp. rate 13, height 5\' 4"  (1.626 m), weight 76.658 kg (169 lb), SpO2 100.00%. Physical Exam  Nursing note and vitals reviewed. Constitutional: She is oriented to person, place, and time. She appears well-developed and well-nourished. No distress.  HENT:  Head: Normocephalic and atraumatic.  Nose: Nose normal.  Mouth/Throat: Oropharynx is clear and moist. No oropharyngeal  exudate.  Eyes: Conjunctivae and EOM are normal. Pupils are equal, round, and reactive to light. No scleral icterus.  Neck: Normal range of motion. Neck supple. No JVD present. No tracheal deviation present. No thyromegaly present.  Cardiovascular: Normal rate, regular rhythm, normal heart sounds and intact distal pulses.  Exam reveals no gallop.   No murmur heard. Respiratory: Effort normal and breath sounds normal. No respiratory distress. She has no wheezes.  GI: Soft. Bowel sounds are normal. She exhibits no distension. There is no tenderness.  Healed ostomy site with ventral wall hernia  Musculoskeletal: Normal range of motion. She exhibits edema. She exhibits no tenderness.  Trace LEE bilaterally  Neurological: She is alert and oriented to person, place, and time. No cranial nerve deficit. Coordination normal.  Skin: Skin is warm and dry. No rash noted. She is not diaphoretic. No erythema.  Psychiatric: She has a normal mood and affect. Her behavior is normal.    Labs reviewed; na 134, k 4.6, bun/cr 30/2.2, glucose 380s-400s Troponin i POC 2.05 ECG reviewed; NSR Chest  x-ray; no acute pulmonary process 5/11 Echo EF 30%, diffuse hypokinesis; mild/moderate elevated RV pressures and RV size 5/11 Myoview with no defects and resolved/normal EF  Problem List Chest Pain/NSTEMI Chronic Kidney Disease Coronary artery disease with 2 prior RCA stents followed by Dr. Swaziland Hypertension SVT Elevated Troponin T2DM Hypothyroidism Shellfish allergy  Assessment/Plan 79 yo woman with T2DM, CAD with prior RCA stents x2, chronic kidney disease, hypertension here with similar chest pain to 2013 with elevated troponin and findings consistent with NSTEMI. She does have elevated creatinine with stage IV CKD so demand ischemia is also possible. Will need to further trend troponins to understand clinical scenario best. Will have to weigh risk/benefits of LHC given increased renal injury/need for  dialysis given her stage IV+ renal disease. Medical management currently and consider LHC based on echocardiogram findings and troponin trend.   - echocardiogram in AM - admit to stepdown, place on telemetry, trend troponins - TSH, BNP, lipid panel in AM - holding glimepiride  - continue synthroid; on metoprolol XL 50 mg bid - continue aspirin 81 mg daily, heparin gtt - Will need to make NPO for potential LHC on Monday AM - amlodipine 5 mg daily for hypertension continued  Tulsi Crossett 01/12/2013, 10:56 PM

## 2013-01-12 NOTE — ED Provider Notes (Signed)
History    CSN: [id number] Arrival date & time 01/12/13  8295  First MD Initiated Contact with Patient 01/12/13 1952     Chief Complaint  Patient presents with  . Chest Pain   (Consider location/radiation/quality/duration/timing/severity/associated sxs/prior Treatment) The history is provided by the patient.  Catherine Mason is a 79 y.o. female hx of DM, CAD s/p stents x 2 in 2003, CKD here with chest pain. Substernal chest pain radiating to her back today after eating some ribs. She thought was reflux or took an aspirin without relief. Came by EMS and was given 324 aspirin and 2 nitroglycerin and now pain subsided. She said this is very similar to prior to her 2 stents.  Past Medical History  Diagnosis Date  . Diabetes mellitus   . Thyroid disease   . Uterine cancer   . Ventral hernia   . CAD (coronary artery disease)     PCI 2003 2.75x13, 2.75x28 Zeta stents mid RCA  . HTN (hypertension)   . Hyperlipidemia   . SVT (supraventricular tachycardia)   . Diverticular disease   . Abdominal wall hernia at previous stoma site   . CKD (chronic kidney disease), stage III   . Hypothyroid   . Anemia    Past Surgical History  Procedure Laterality Date  . Abdominal hysterectomy    . Coronary stents    . Colon surgery      colectomy  . Colostomy reversal.     Family History  Problem Relation Age of Onset  . Heart disease Mother     heart attack  . Cancer Father     prostate  . Diabetes Father    History  Substance Use Topics  . Smoking status: Never Smoker   . Smokeless tobacco: Never Used  . Alcohol Use: No   OB History   Grav Para Term Preterm Abortions TAB SAB Ect Mult Living                 Review of Systems  Cardiovascular: Positive for chest pain.  All other systems reviewed and are negative.    Allergies  Review of patient's allergies indicates no known allergies.  Home Medications   Current Outpatient Rx  Name  Route  Sig  Dispense  Refill  .  amLODipine (NORVASC) 5 MG tablet   Oral   Take 1 tablet (5 mg total) by mouth daily.   90 tablet   1   . aspirin 81 MG tablet   Oral   Take 81 mg by mouth daily.         . Cholecalciferol (VITAMIN D PO)   Oral   Take 1,000 mg by mouth daily.         . fenofibrate 160 MG tablet   Oral   Take 160 mg by mouth daily.          Marland Kitchen glimepiride (AMARYL) 2 MG tablet   Oral   Take 2 mg by mouth.          . Levothyroxine Sodium (SYNTHROID PO)   Oral   Take 112 mcg by mouth daily.          . metoprolol succinate (TOPROL-XL) 50 MG 24 hr tablet   Oral   Take 50 mg by mouth 2 (two) times daily. Take with or immediately following a meal.          BP 148/70  Pulse 81  Temp(Src) 98.3 F (36.8 C) (Oral)  Resp 18  Ht 5\' 4"  (1.626 m)  Wt 169 lb (76.658 kg)  BMI 28.99 kg/m2  SpO2 100% Physical Exam  Nursing note and vitals reviewed. Constitutional: She is oriented to person, place, and time. She appears well-nourished.  Comfortable, NAD   HENT:  Head: Normocephalic.  Mouth/Throat: Oropharynx is clear and moist.  Eyes: Conjunctivae are normal. Pupils are equal, round, and reactive to light.  Neck: Normal range of motion. Neck supple.  Cardiovascular: Normal rate, regular rhythm and normal heart sounds.   Pulmonary/Chest: Effort normal and breath sounds normal. No respiratory distress. She has no wheezes. She has no rales.  Abdominal: Soft. Bowel sounds are normal. She exhibits no distension. There is no tenderness. There is no rebound and no guarding.  Musculoskeletal: Normal range of motion. She exhibits no edema and no tenderness.  Neurological: She is alert and oriented to person, place, and time.  Skin: Skin is warm and dry.  Psychiatric: She has a normal mood and affect. Her behavior is normal. Judgment and thought content normal.    ED Course  Procedures (including critical care time) CRITICAL CARE Performed by: Silverio Lay, Nayali Talerico   Total critical care time: 30 min   Critical care time was exclusive of separately billable procedures and treating other patients.  Critical care was necessary to treat or prevent imminent or life-threatening deterioration.  Critical care was time spent personally by me on the following activities: development of treatment plan with patient and/or surrogate as well as nursing, discussions with consultants, evaluation of patient's response to treatment, examination of patient, obtaining history from patient or surrogate, ordering and performing treatments and interventions, ordering and review of laboratory studies, ordering and review of radiographic studies, pulse oximetry and re-evaluation of patient's condition.   Labs Reviewed  COMPREHENSIVE METABOLIC PANEL - Abnormal; Notable for the following:    Sodium 134 (*)    Glucose, Bld 409 (*)    BUN 32 (*)    Creatinine, Ser 2.01 (*)    AST 46 (*)    GFR calc non Af Amer 22 (*)    GFR calc Af Amer 25 (*)    All other components within normal limits  POCT I-STAT TROPONIN I - Abnormal; Notable for the following:    Troponin i, poc 2.05 (*)    All other components within normal limits  POCT I-STAT, CHEM 8 - Abnormal; Notable for the following:    BUN 30 (*)    Creatinine, Ser 2.20 (*)    Glucose, Bld 386 (*)    All other components within normal limits  CBC WITH DIFFERENTIAL  HEPARIN LEVEL (UNFRACTIONATED)  CBC   No results found. No diagnosis found.   Date: 01/12/2013  Rate: 78  Rhythm: normal sinus rhythm  QRS Axis: normal  Intervals: normal  ST/T Wave abnormalities: nonspecific ST changes  Conduction Disutrbances:none  Narrative Interpretation:   Old EKG Reviewed: unchanged    MDM  PORSCHE NOGUCHI is a 79 y.o. female here with chest pain. Concerned for ACS. Will likely need admission for further cardiac workup. Not concerned for PE or dissection.   9:24 PM Trop positive. Started on heparin drip. Discussed with Dr. Tresa Endo from [place], will admit for  unstable angina.     Richardean Canal, MD 01/12/13 2125

## 2013-01-12 NOTE — Progress Notes (Signed)
ANTICOAGULATION CONSULT NOTE - Initial Consult  Pharmacy Consult for Heparin Indication: chest pain/ACS  No Known Allergies  Patient Measurements:   Heparin Dosing Weight:  70.6 kg  Vital Signs: Temp: 98.3 F (36.8 C) (07/12 1950) Temp src: Oral (07/12 1950) BP: 148/70 mmHg (07/12 1950) Pulse Rate: 81 (07/12 1950)  Labs:  Recent Labs  01/12/13 2000  HGB 12.5  HCT 36.9  PLT 175    The CrCl is unknown because both a height and weight (above a minimum accepted value) are required for this calculation.   Medical History: Past Medical History  Diagnosis Date  . Diabetes mellitus   . Thyroid disease   . Uterine cancer   . Ventral hernia   . CAD (coronary artery disease)     PCI 2003 2.75x13, 2.75x28 Zeta stents mid RCA  . HTN (hypertension)   . Hyperlipidemia   . SVT (supraventricular tachycardia)   . Diverticular disease   . Abdominal wall hernia at previous stoma site   . CKD (chronic kidney disease), stage III   . Hypothyroid   . Anemia     Medications: Norvasc, ASA 81mg , Vit D, fenofibrate, Amaryl, Synthroid, ToprolXL  Assessment: CP radiating to her back with known h/o CAD. Troponin i=2.05 elevated. Baseline CBC WNL. No acute bleeding noted.  Goal of Therapy:  Heparin level 0.3-0.7 units/ml Monitor platelets by anticoagulation protocol: Yes   Plan:  Heparin 4000 unit iv bolus Heparin infusion at 850 units/hr Check heparin level and CBC daily  Misty Stanley Stillinger 01/12/2013,8:35 PM

## 2013-01-12 NOTE — ED Notes (Signed)
Critical i-STAT cTnl was shown to Dr. Silverio Lay.

## 2013-01-13 ENCOUNTER — Encounter (HOSPITAL_COMMUNITY): Payer: Self-pay | Admitting: *Deleted

## 2013-01-13 DIAGNOSIS — I2510 Atherosclerotic heart disease of native coronary artery without angina pectoris: Secondary | ICD-10-CM

## 2013-01-13 DIAGNOSIS — I200 Unstable angina: Secondary | ICD-10-CM

## 2013-01-13 LAB — BASIC METABOLIC PANEL
BUN: 27 mg/dL — ABNORMAL HIGH (ref 6–23)
CO2: 23 mEq/L (ref 19–32)
Chloride: 107 mEq/L (ref 96–112)
GFR calc non Af Amer: 27 mL/min — ABNORMAL LOW (ref >90)
Glucose, Bld: 167 mg/dL — ABNORMAL HIGH (ref 70–99)
Potassium: 4 mEq/L (ref 3.5–5.1)
Sodium: 139 mEq/L (ref 135–145)

## 2013-01-13 LAB — HEMOGLOBIN A1C
Hgb A1c MFr Bld: 7.9 % — ABNORMAL HIGH (ref <5.7)
Mean Plasma Glucose: 180 mg/dL — ABNORMAL HIGH (ref <117)

## 2013-01-13 LAB — COMPREHENSIVE METABOLIC PANEL
ALT: 30 U/L (ref 0–35)
Albumin: 3.5 g/dL (ref 3.5–5.2)
Alkaline Phosphatase: 60 U/L (ref 39–117)
BUN: 29 mg/dL — ABNORMAL HIGH (ref 6–23)
Calcium: 10 mg/dL (ref 8.4–10.5)
GFR calc Af Amer: 29 mL/min — ABNORMAL LOW (ref >90)
Potassium: 3.8 mEq/L (ref 3.5–5.1)
Sodium: 137 mEq/L (ref 135–145)
Total Protein: 6.6 g/dL (ref 6.0–8.3)

## 2013-01-13 LAB — MRSA PCR SCREENING: MRSA by PCR: NEGATIVE

## 2013-01-13 LAB — HEPARIN LEVEL (UNFRACTIONATED)
Heparin Unfractionated: 0.35 IU/mL (ref 0.30–0.70)
Heparin Unfractionated: 0.32 IU/mL (ref 0.30–0.70)

## 2013-01-13 LAB — PROTIME-INR: INR: 1.08 (ref 0.00–1.49)

## 2013-01-13 LAB — GLUCOSE, CAPILLARY
Glucose-Capillary: 212 mg/dL — ABNORMAL HIGH (ref 70–99)
Glucose-Capillary: 212 mg/dL — ABNORMAL HIGH (ref 70–99)

## 2013-01-13 LAB — LIPID PANEL
Cholesterol: 143 mg/dL (ref 0–200)
Total CHOL/HDL Ratio: 3.3 RATIO

## 2013-01-13 LAB — CBC
Platelets: 162 10*3/uL (ref 150–400)
RDW: 13.2 % (ref 11.5–15.5)
WBC: 7.7 10*3/uL (ref 4.0–10.5)

## 2013-01-13 LAB — TROPONIN I
Troponin I: >20 ng/mL (ref <0.30)
Troponin I: >20 ng/mL (ref <0.30)
Troponin I: 16.87 ng/mL (ref <0.30)

## 2013-01-13 LAB — MAGNESIUM: Magnesium: 1.9 mg/dL (ref 1.5–2.5)

## 2013-01-13 MED ORDER — INSULIN ASPART 100 UNIT/ML ~~LOC~~ SOLN
0.0000 [IU] | Freq: Three times a day (TID) | SUBCUTANEOUS | Status: DC
  Administered 2013-01-13: 2 [IU] via SUBCUTANEOUS
  Administered 2013-01-13 (×2): 3 [IU] via SUBCUTANEOUS
  Administered 2013-01-14 – 2013-01-15 (×3): 2 [IU] via SUBCUTANEOUS

## 2013-01-13 MED ORDER — INSULIN ASPART 100 UNIT/ML ~~LOC~~ SOLN: 0.0000 [IU] | Freq: Every day | SUBCUTANEOUS | Status: DC

## 2013-01-13 MED ORDER — SODIUM CHLORIDE 0.9 % IJ SOLN: 3.0000 mL | INTRAMUSCULAR | Status: DC | PRN

## 2013-01-13 MED ORDER — SODIUM CHLORIDE 0.9 % IV SOLN: 1.0000 mL/kg/h | INTRAVENOUS | Status: DC

## 2013-01-13 MED ORDER — ASPIRIN EC 81 MG PO TBEC
81.0000 mg | DELAYED_RELEASE_TABLET | Freq: Every day | ORAL | Status: DC
  Administered 2013-01-13 – 2013-01-15 (×3): 81 mg via ORAL
  Filled 2013-01-13 (×3): qty 1

## 2013-01-13 MED ORDER — LEVOTHYROXINE SODIUM 112 MCG PO TABS
112.0000 ug | ORAL_TABLET | Freq: Every day | ORAL | Status: DC
  Administered 2013-01-13 – 2013-01-15 (×2): 112 ug via ORAL
  Filled 2013-01-13 (×5): qty 1

## 2013-01-13 MED ORDER — NITROGLYCERIN 0.4 MG SL SUBL: 0.4000 mg | SUBLINGUAL_TABLET | SUBLINGUAL | Status: DC | PRN

## 2013-01-13 MED ORDER — SODIUM CHLORIDE 0.9 % IV SOLN
INTRAVENOUS | Status: DC
  Administered 2013-01-13: 01:00:00 via INTRAVENOUS

## 2013-01-13 MED ORDER — VITAMIN D3 25 MCG (1000 UNIT) PO TABS
1000.0000 [IU] | ORAL_TABLET | Freq: Every day | ORAL | Status: DC
  Administered 2013-01-13 – 2013-01-15 (×3): 1000 [IU] via ORAL
  Filled 2013-01-13 (×3): qty 1

## 2013-01-13 MED ORDER — SODIUM CHLORIDE 0.9 % IJ SOLN
3.0000 mL | Freq: Two times a day (BID) | INTRAMUSCULAR | Status: DC
  Administered 2013-01-13 – 2013-01-14 (×2): 3 mL via INTRAVENOUS

## 2013-01-13 MED ORDER — SODIUM CHLORIDE 0.9 % IV SOLN: 250.0000 mL | INTRAVENOUS | Status: DC | PRN

## 2013-01-13 MED ORDER — ONDANSETRON HCL 4 MG/2ML IJ SOLN: 4.0000 mg | Freq: Four times a day (QID) | INTRAMUSCULAR | Status: DC | PRN

## 2013-01-13 MED ORDER — ATORVASTATIN CALCIUM 80 MG PO TABS
80.0000 mg | ORAL_TABLET | Freq: Every day | ORAL | Status: DC
  Administered 2013-01-13 – 2013-01-14 (×3): 80 mg via ORAL
  Filled 2013-01-13 (×6): qty 1

## 2013-01-13 MED ORDER — ACETAMINOPHEN 325 MG PO TABS: 650.0000 mg | ORAL_TABLET | ORAL | Status: DC | PRN

## 2013-01-13 MED ORDER — AMLODIPINE BESYLATE 5 MG PO TABS
5.0000 mg | ORAL_TABLET | Freq: Every day | ORAL | Status: DC
  Administered 2013-01-13 – 2013-01-15 (×3): 5 mg via ORAL
  Filled 2013-01-13 (×3): qty 1

## 2013-01-13 MED ORDER — SODIUM CHLORIDE 0.9 % IJ SOLN: 3.0000 mL | Freq: Two times a day (BID) | INTRAMUSCULAR | Status: DC

## 2013-01-13 MED ORDER — METOPROLOL SUCCINATE ER 50 MG PO TB24
50.0000 mg | ORAL_TABLET | Freq: Two times a day (BID) | ORAL | Status: DC
  Administered 2013-01-13 – 2013-01-15 (×6): 50 mg via ORAL
  Filled 2013-01-13 (×8): qty 1

## 2013-01-13 NOTE — Progress Notes (Signed)
ANTICOAGULATION CONSULT NOTE Pharmacy Consult for Heparin Indication: chest pain/ACS  Allergies  Allergen Reactions  . Shellfish Allergy Diarrhea    Patient Measurements: Height: 5\' 4"  (162.6 cm) Weight: 171 lb 1.2 oz (77.6 kg) IBW/kg (Calculated) : 54.7 Heparin Dosing Weight:  70.6 kg  Vital Signs: Temp: 98.3 F (36.8 C) (07/13 0400) Temp src: Oral (07/13 0400) BP: 149/69 mmHg (07/13 0400) Pulse Rate: 72 (07/12 2323)  Labs:  Recent Labs  01/12/13 2000 01/12/13 2103 01/13/13 0035 01/13/13 0430  HGB 12.5 14.3  --  12.9  HCT 36.9 42.0  --  38.3  PLT 175  --   --  162  LABPROT  --   --   --  13.8  INR  --   --   --  1.08  HEPARINUNFRC  --   --   --  0.35  CREATININE 2.01* 2.20* 1.79* 1.68*  TROPONINI  --   --  >20.00*  --     Estimated Creatinine Clearance: 26 ml/min (by C-G formula based on Cr of 1.68).  Assessment:  79 yo female with chest pain for heparin  Goal of Therapy:  Heparin level 0.3-0.7 units/ml Monitor platelets by anticoagulation protocol: Yes   Plan:  Continue Heparin at current rate Check heparin level in 6 hours to verify  Eddie Candle 01/13/2013,5:46 AM

## 2013-01-13 NOTE — Progress Notes (Signed)
Nursing 0220 Dr. Tresa Endo txt paged with Troponin level to second number listed.

## 2013-01-13 NOTE — Progress Notes (Signed)
SUBJECTIVE:  No further chest pain.    PHYSICAL EXAM Filed Vitals:   01/13/13 0820 01/13/13 0821 01/13/13 0822 01/13/13 0823  BP:      Pulse: 65 70 68 69  Temp:      TempSrc:      Resp: 21 17 17 18   Height:      Weight:      SpO2: 97% 97% 96% 96%   General:  No distress Lungs:  Right basilar crackles Heart:  RRR Abdomen:  Positive bowel sounds, no rebound no guarding Extremities:  No edema  LABS: Lab Results  Component Value Date   TROPONINI >20.00* 01/13/2013   Results for orders placed during the hospital encounter of 01/12/13 (from the past 24 hour(s))  CBC WITH DIFFERENTIAL     Status: None   Collection Time    01/12/13  8:00 PM      Result Value Range   WBC 7.2  4.0 - 10.5 K/uL   RBC 4.04  3.87 - 5.11 MIL/uL   Hemoglobin 12.5  12.0 - 15.0 g/dL   HCT 16.1  09.6 - 04.5 %   MCV 91.3  78.0 - 100.0 fL   MCH 30.9  26.0 - 34.0 pg   MCHC 33.9  30.0 - 36.0 g/dL   RDW 40.9  81.1 - 91.4 %   Platelets 175  150 - 400 K/uL   Neutrophils Relative % 76  43 - 77 %   Neutro Abs 5.5  1.7 - 7.7 K/uL   Lymphocytes Relative 13  12 - 46 %   Lymphs Abs 1.0  0.7 - 4.0 K/uL   Monocytes Relative 9  3 - 12 %   Monocytes Absolute 0.6  0.1 - 1.0 K/uL   Eosinophils Relative 1  0 - 5 %   Eosinophils Absolute 0.1  0.0 - 0.7 K/uL   Basophils Relative 0  0 - 1 %   Basophils Absolute 0.0  0.0 - 0.1 K/uL  COMPREHENSIVE METABOLIC PANEL     Status: Abnormal   Collection Time    01/12/13  8:00 PM      Result Value Range   Sodium 134 (*) 135 - 145 mEq/L   Potassium 4.1  3.5 - 5.1 mEq/L   Chloride 100  96 - 112 mEq/L   CO2 22  19 - 32 mEq/L   Glucose, Bld 409 (*) 70 - 99 mg/dL   BUN 32 (*) 6 - 23 mg/dL   Creatinine, Ser 7.82 (*) 0.50 - 1.10 mg/dL   Calcium 9.6  8.4 - 95.6 mg/dL   Total Protein 7.1  6.0 - 8.3 g/dL   Albumin 3.7  3.5 - 5.2 g/dL   AST 46 (*) 0 - 37 U/L   ALT 24  0 - 35 U/L   Alkaline Phosphatase 62  39 - 117 U/L   Total Bilirubin 0.3  0.3 - 1.2 mg/dL   GFR calc non Af  Amer 22 (*) >90 mL/min   GFR calc Af Amer 25 (*) >90 mL/min  POCT I-STAT TROPONIN I     Status: Abnormal   Collection Time    01/12/13  8:16 PM      Result Value Range   Troponin i, poc 2.05 (*) 0.00 - 0.08 ng/mL   Comment NOTIFIED PHYSICIAN     Comment 3           POCT I-STAT, CHEM 8     Status: Abnormal   Collection Time  01/12/13  9:03 PM      Result Value Range   Sodium 138  135 - 145 mEq/L   Potassium 4.6  3.5 - 5.1 mEq/L   Chloride 105  96 - 112 mEq/L   BUN 30 (*) 6 - 23 mg/dL   Creatinine, Ser 9.60 (*) 0.50 - 1.10 mg/dL   Glucose, Bld 454 (*) 70 - 99 mg/dL   Calcium, Ion 0.98  1.19 - 1.30 mmol/L   TCO2 23  0 - 100 mmol/L   Hemoglobin 14.3  12.0 - 15.0 g/dL   HCT 14.7  82.9 - 56.2 %  GLUCOSE, CAPILLARY     Status: Abnormal   Collection Time    01/12/13 10:52 PM      Result Value Range   Glucose-Capillary 276 (*) 70 - 99 mg/dL   Comment 1 Documented in Chart     Comment 2 Notify RN    TROPONIN I     Status: Abnormal   Collection Time    01/13/13 12:35 AM      Result Value Range   Troponin I >20.00 (*) <0.30 ng/mL  COMPREHENSIVE METABOLIC PANEL     Status: Abnormal   Collection Time    01/13/13 12:35 AM      Result Value Range   Sodium 137  135 - 145 mEq/L   Potassium 3.8  3.5 - 5.1 mEq/L   Chloride 105  96 - 112 mEq/L   CO2 24  19 - 32 mEq/L   Glucose, Bld 191 (*) 70 - 99 mg/dL   BUN 29 (*) 6 - 23 mg/dL   Creatinine, Ser 1.30 (*) 0.50 - 1.10 mg/dL   Calcium 86.5  8.4 - 78.4 mg/dL   Total Protein 6.6  6.0 - 8.3 g/dL   Albumin 3.5  3.5 - 5.2 g/dL   AST 696 (*) 0 - 37 U/L   ALT 30  0 - 35 U/L   Alkaline Phosphatase 60  39 - 117 U/L   Total Bilirubin 0.4  0.3 - 1.2 mg/dL   GFR calc non Af Amer 25 (*) >90 mL/min   GFR calc Af Amer 29 (*) >90 mL/min  MAGNESIUM     Status: None   Collection Time    01/13/13 12:35 AM      Result Value Range   Magnesium 1.9  1.5 - 2.5 mg/dL  PRO B NATRIURETIC PEPTIDE     Status: Abnormal   Collection Time    01/13/13 12:35  AM      Result Value Range   Pro B Natriuretic peptide (BNP) 1405.0 (*) 0 - 450 pg/mL  TROPONIN I     Status: Abnormal   Collection Time    01/13/13  4:19 AM      Result Value Range   Troponin I >20.00 (*) <0.30 ng/mL  HEPARIN LEVEL (UNFRACTIONATED)     Status: None   Collection Time    01/13/13  4:30 AM      Result Value Range   Heparin Unfractionated 0.35  0.30 - 0.70 IU/mL  CBC     Status: None   Collection Time    01/13/13  4:30 AM      Result Value Range   WBC 7.7  4.0 - 10.5 K/uL   RBC 4.21  3.87 - 5.11 MIL/uL   Hemoglobin 12.9  12.0 - 15.0 g/dL   HCT 29.5  28.4 - 13.2 %   MCV 91.0  78.0 - 100.0 fL  MCH 30.6  26.0 - 34.0 pg   MCHC 33.7  30.0 - 36.0 g/dL   RDW 81.1  91.4 - 78.2 %   Platelets 162  150 - 400 K/uL  LIPID PANEL     Status: Abnormal   Collection Time    01/13/13  4:30 AM      Result Value Range   Cholesterol 143  0 - 200 mg/dL   Triglycerides 956 (*) <150 mg/dL   HDL 44  >21 mg/dL   Total CHOL/HDL Ratio 3.3     VLDL 39  0 - 40 mg/dL   LDL Cholesterol 60  0 - 99 mg/dL  BASIC METABOLIC PANEL     Status: Abnormal   Collection Time    01/13/13  4:30 AM      Result Value Range   Sodium 139  135 - 145 mEq/L   Potassium 4.0  3.5 - 5.1 mEq/L   Chloride 107  96 - 112 mEq/L   CO2 23  19 - 32 mEq/L   Glucose, Bld 167 (*) 70 - 99 mg/dL   BUN 27 (*) 6 - 23 mg/dL   Creatinine, Ser 3.08 (*) 0.50 - 1.10 mg/dL   Calcium 9.9  8.4 - 65.7 mg/dL   GFR calc non Af Amer 27 (*) >90 mL/min   GFR calc Af Amer 32 (*) >90 mL/min  PROTIME-INR     Status: None   Collection Time    01/13/13  4:30 AM      Result Value Range   Prothrombin Time 13.8  11.6 - 15.2 seconds   INR 1.08  0.00 - 1.49  MRSA PCR SCREENING     Status: None   Collection Time    01/13/13  5:52 AM      Result Value Range   MRSA by PCR NEGATIVE  NEGATIVE    Intake/Output Summary (Last 24 hours) at 01/13/13 1113 Last data filed at 01/13/13 1000  Gross per 24 hour  Intake   1305 ml  Output   1050  ml  Net    255 ml    EKG:  Pending.   ASSESSMENT AND PLAN:  NSTEMI (non-ST elevated myocardial infarction):  Discussed the risks and benefits of cath.  Given the NQWMI cath is indicated.  She understands the risk to the kidneys.  We would limit the dye.  I would gently hydrate starting in the AM. EKG pending.  HTN (hypertension) : Continue current therapy  CKD (chronic kidney disease), stage III:  As above.     Fayrene Fearing [facility] 01/13/2013 11:13 AM

## 2013-01-13 NOTE — Progress Notes (Signed)
Nursing 0200 Troponin <20.  Dr. Tresa Endo txt paged with info.

## 2013-01-13 NOTE — Progress Notes (Signed)
ANTICOAGULATION CONSULT NOTE Pharmacy Consult for Heparin Indication: chest pain/ACS  Allergies  Allergen Reactions  . Shellfish Allergy Diarrhea    Patient Measurements: Height: 5\' 4"  (162.6 cm) Weight: 171 lb 1.2 oz (77.6 kg) IBW/kg (Calculated) : 54.7 Heparin Dosing Weight:  70.6 kg  Vital Signs: Temp: 98.7 F (37.1 C) (07/13 1159) Temp src: Oral (07/13 1159) BP: 136/68 mmHg (07/13 0818) Pulse Rate: 69 (07/13 0823)  Labs:  Recent Labs  01/12/13 2000 01/12/13 2103 01/13/13 0035 01/13/13 0419 01/13/13 0430 01/13/13 1250  HGB 12.5 14.3  --   --  12.9  --   HCT 36.9 42.0  --   --  38.3  --   PLT 175  --   --   --  162  --   LABPROT  --   --   --   --  13.8  --   INR  --   --   --   --  1.08  --   HEPARINUNFRC  --   --   --   --  0.35 0.32  CREATININE 2.01* 2.20* 1.79*  --  1.68*  --   TROPONINI  --   --  >20.00* >20.00*  --   --     Estimated Creatinine Clearance: 26 ml/min (by C-G formula based on Cr of 1.68).  Assessment:   79 yo female with chest pain for heparin. HL 0.35>0.32 (drawn appropriately). Heparin for NSTEMI. CBC stable, no bleeding noted.   CrCl 26 ml/min, Scr 1.68 (CKD stage III)  Goal of Therapy:  Heparin level 0.3-0.7 units/ml Monitor platelets by anticoagulation protocol: Yes   Plan:  Continue Heparin at current rate of 850 units/hr  CBC and HL daily Monitor renal function   Sherlonda Flater B. Artelia Laroche, PharmD Clinical Pharmacist - Resident Pager: [phone] Phone: [phone] 01/13/2013 1:42 PM

## 2013-01-14 ENCOUNTER — Encounter (HOSPITAL_COMMUNITY)
Admission: EM | Disposition: A | Discharge status: Home / Self Care | Payer: Self-pay | Source: Home / Self Care | Attending: Cardiology

## 2013-01-14 DIAGNOSIS — I2510 Atherosclerotic heart disease of native coronary artery without angina pectoris: Secondary | ICD-10-CM

## 2013-01-14 DIAGNOSIS — I059 Rheumatic mitral valve disease, unspecified: Secondary | ICD-10-CM

## 2013-01-14 DIAGNOSIS — K439 Ventral hernia without obstruction or gangrene: Secondary | ICD-10-CM | POA: Diagnosis not present

## 2013-01-14 DIAGNOSIS — I214 Non-ST elevation (NSTEMI) myocardial infarction: Secondary | ICD-10-CM

## 2013-01-14 DIAGNOSIS — E119 Type 2 diabetes mellitus without complications: Secondary | ICD-10-CM | POA: Diagnosis not present

## 2013-01-14 HISTORY — PX: LEFT HEART CATHETERIZATION WITH CORONARY ANGIOGRAM: SHX5451

## 2013-01-14 HISTORY — PX: PERCUTANEOUS CORONARY STENT INTERVENTION (PCI-S): SHX5485

## 2013-01-14 LAB — GLUCOSE, CAPILLARY
Glucose-Capillary: 153 mg/dL — ABNORMAL HIGH (ref 70–99)
Glucose-Capillary: 193 mg/dL — ABNORMAL HIGH (ref 70–99)

## 2013-01-14 LAB — HEPARIN LEVEL (UNFRACTIONATED): Heparin Unfractionated: 0.18 IU/mL — ABNORMAL LOW (ref 0.30–0.70)

## 2013-01-14 LAB — BASIC METABOLIC PANEL
BUN: 24 mg/dL — ABNORMAL HIGH (ref 6–23)
Calcium: 9.2 mg/dL (ref 8.4–10.5)
Creatinine, Ser: 1.58 mg/dL — ABNORMAL HIGH (ref 0.50–1.10)
GFR calc non Af Amer: 29 mL/min — ABNORMAL LOW (ref >90)
Glucose, Bld: 168 mg/dL — ABNORMAL HIGH (ref 70–99)

## 2013-01-14 LAB — CBC
MCH: 30.7 pg (ref 26.0–34.0)
MCHC: 33.6 g/dL (ref 30.0–36.0)
Platelets: 177 10*3/uL (ref 150–400)

## 2013-01-14 SURGERY — LEFT HEART CATHETERIZATION WITH CORONARY ANGIOGRAM
Anesthesia: LOCAL

## 2013-01-14 MED ORDER — HEPARIN (PORCINE) IN NACL 2-0.9 UNIT/ML-% IJ SOLN
INTRAMUSCULAR | Status: AC
  Filled 2013-01-14: qty 1000

## 2013-01-14 MED ORDER — TICAGRELOR 90 MG PO TABS
90.0000 mg | ORAL_TABLET | Freq: Two times a day (BID) | ORAL | Status: DC
  Filled 2013-01-14: qty 1

## 2013-01-14 MED ORDER — MIDAZOLAM HCL 2 MG/2ML IJ SOLN
INTRAMUSCULAR | Status: AC
  Filled 2013-01-14: qty 2

## 2013-01-14 MED ORDER — TICAGRELOR 90 MG PO TABS
ORAL_TABLET | ORAL | Status: AC
  Filled 2013-01-14: qty 2

## 2013-01-14 MED ORDER — BIVALIRUDIN 250 MG IV SOLR
INTRAVENOUS | Status: AC
  Filled 2013-01-14: qty 250

## 2013-01-14 MED ORDER — SODIUM CHLORIDE 0.9 % IV SOLN: 250.0000 mL | INTRAVENOUS | Status: DC | PRN

## 2013-01-14 MED ORDER — LIDOCAINE HCL (PF) 1 % IJ SOLN
INTRAMUSCULAR | Status: AC
  Filled 2013-01-14: qty 30

## 2013-01-14 MED ORDER — CLOPIDOGREL BISULFATE 75 MG PO TABS
75.0000 mg | ORAL_TABLET | Freq: Every day | ORAL | Status: DC
  Administered 2013-01-15: 75 mg via ORAL
  Filled 2013-01-14: qty 2
  Filled 2013-01-14: qty 1

## 2013-01-14 MED ORDER — SODIUM CHLORIDE 0.9 % IJ SOLN: 3.0000 mL | INTRAMUSCULAR | Status: DC | PRN

## 2013-01-14 MED ORDER — CLOPIDOGREL BISULFATE 75 MG PO TABS
300.0000 mg | ORAL_TABLET | Freq: Once | ORAL | Status: AC
  Administered 2013-01-14: 300 mg via ORAL
  Filled 2013-01-14: qty 2

## 2013-01-14 MED ORDER — SODIUM CHLORIDE 0.9 % IV SOLN: 1.0000 mL/kg/h | INTRAVENOUS | Status: AC

## 2013-01-14 MED ORDER — SODIUM CHLORIDE 0.9 % IJ SOLN: 3.0000 mL | Freq: Two times a day (BID) | INTRAMUSCULAR | Status: DC

## 2013-01-14 MED ORDER — DOCUSATE SODIUM 100 MG PO CAPS
100.0000 mg | ORAL_CAPSULE | Freq: Every day | ORAL | Status: DC | PRN
  Administered 2013-01-14: 23:00:00 100 mg via ORAL
  Filled 2013-01-14: qty 1

## 2013-01-14 NOTE — CV Procedure (Signed)
   CARDIAC CATH NOTE  Name: Catherine Mason MRN: [id number] DOB: 05-15-1928  Procedure: PTCA and stenting of the proximal LAD  Indication: NSTEMI. 79 year-old diabetic woman with known CAD, presenting with NSTEMI ACS. Diagnostic cath showed critical prox/mid-LAD stenosis. Plan for PCI.   Procedural Details:  There was an indwelling 5 Fr sheath in the RFA. This was changed out for a 6 Fr. Patient was preloaded with brilinta 180 mg orally. Weight-based bivalirudin was given for anticoagulation. Once a therapeutic ACT was achieved, a 6 [place] XB-LAD 3.5 cm guide catheter was inserted.  A BMW coronary guidewire was used to cross the lesion.  The lesion was predilated with a 2.0x12 mm balloon.  There was severe diffuse segmental disease and multiple inflations were done. The lesion was then stented with a 2.5x32 mm Promus Premier drug-eluting stent.  The stent was postdilated with a 2.5 mm noncompliant balloon to 18 atm. I chose a 2.5 [place] balloon for post-dil because the stent distally appeared oversized. Following PCI, there was 0% residual stenosis and TIMI-3 flow. Final angiography confirmed an excellent result. The patient tolerated the procedure well. There were no immediate procedural complications. Femoral hemostasis was achieved with a Perclose. The patient was transferred to the post catheterization recovery area for further monitoring.  Lesion Data: Vessel: LAD/prox Percent stenosis (pre): 95 TIMI-flow (pre):  3 Stent:  2.5x32 mm DES (Promus Premier) Percent stenosis (post): 0 TIMI-flow (post): 3  Conclusions: Successful PCI of the proximal LAD as above  Recommendations: ASA/brilinta x 12 months minimum.  Tonny Bollman 01/14/2013, 12:16 PM

## 2013-01-14 NOTE — Progress Notes (Signed)
ANTICOAGULATION CONSULT NOTE Pharmacy Consult for Heparin Indication: chest pain/ACS  Allergies  Allergen Reactions  . Shellfish Allergy Diarrhea    Patient Measurements: Height: 5\' 4"  (162.6 cm) Weight: 171 lb 11.8 oz (77.9 kg) IBW/kg (Calculated) : 54.7 Heparin Dosing Weight:  70.6 kg  Vital Signs: Temp: 98.1 F (36.7 C) (07/14 0743) Temp src: Oral (07/14 0743) BP: 120/94 mmHg (07/14 0400) Pulse Rate: 143 (07/14 0400)  Labs:  Recent Labs  01/12/13 2000 01/12/13 2103 01/13/13 0035 01/13/13 0419 01/13/13 0430 01/13/13 1250 01/13/13 1431 01/14/13 0500 01/14/13 0525  HGB 12.5 14.3  --   --  12.9  --   --  13.0  --   HCT 36.9 42.0  --   --  38.3  --   --  38.7  --   PLT 175  --   --   --  162  --   --  177  --   LABPROT  --   --   --   --  13.8  --   --   --   --   INR  --   --   --   --  1.08  --   --   --   --   HEPARINUNFRC  --   --   --   --  0.35 0.32  --  0.18*  --   CREATININE 2.01* 2.20* 1.79*  --  1.68*  --   --   --  1.58*  TROPONINI  --   --  >20.00* >20.00*  --   --  16.87*  --   --     Estimated Creatinine Clearance: 27.7 ml/min (by C-G formula based on Cr of 1.58).  Assessment:  69 yof continues on IV heparin gtt for NSTEMI. Heparin level is now subtherapeutic on 850 units/hr after multiple therapeutic levels. CBC is stable and no bleeding noted. Noted plans for cardiac cath today.   Goal of Therapy:  Heparin level 0.3-0.7 units/ml Monitor platelets by anticoagulation protocol: Yes   Plan:  1. Increase heparin gtt to 1050 units/hr 2. Check an 8 hour heparin level or f/u post-cath  Lysle Pearl, PharmD, BCPS Pager # [phone] 01/14/2013 8:32 AM

## 2013-01-14 NOTE — Progress Notes (Signed)
Post-PCI:  Pt doing well, except has felt like she can't catch her breath at times. Suspect this is related to Brilinta as her description is typical for this. Will change to plavix. Plan d/c tomorrow if stable.  Tonny Bollman 01/14/2013 7:39 PM

## 2013-01-14 NOTE — Progress Notes (Signed)
  Echocardiogram 2D Echocardiogram has been performed.  Georgian Co 01/14/2013, 10:09 AM

## 2013-01-14 NOTE — CV Procedure (Signed)
   Cardiac Catheterization Procedure Note  Name: Catherine Mason MRN: [id number] DOB: 03/21/1928  Procedure: Left Heart Cath, Selective Coronary Angiography, LV angiography  Indication:   NQWMI  Procedural details: The right groin was prepped, draped, and anesthetized with 1% lidocaine. Using modified Seldinger technique, a 5 French sheath was introduced into the right femoral artery. Standard Judkins catheters were used for coronary angiography and left ventriculography. Catheter exchanges were performed over a guidewire. There were no immediate procedural complications. The patient was transferred to the post catheterization recovery area for further monitoring.  Procedural Findings:   Hemodynamics:     AO 125/59    LV  128/8   Coronary angiography:   Coronary dominance: Right  Left mainstem:   LM short and normal  Left anterior descending (LAD):   LAD was a narrow vessel but wrapped the apex.  There was a proximal 99% focal stenosis.  The vessel proximal to this had diffuse 50 - 60% stenosis.  D1 was very proximal, large with proximal luminal irregularities. D2 was small to moderate and normal.    Left circumflex (LCx):  AV groove with mid luminal irregularities.  MOM moderate sized and normal.  PL small and normal   Right coronary artery (RCA):  Large dominant vessel.  Proximal stent widely patent.  Mild luminal irregularities.  PDA moderate sized and normal.    Left ventriculography: Left ventricular not injected secondary to CKD.   Final Conclusions:    Severe LAD stenosis.  Patent RCA stent  Recommendations: PCI of LAD per Dr. Excell Seltzer.  Rollene Rotunda 01/14/2013, 12:25 PM

## 2013-01-14 NOTE — Progress Notes (Signed)
Utilization review completed.  

## 2013-01-14 NOTE — Progress Notes (Signed)
SUBJECTIVE:  No further chest pain.  No SOB.  No distress   PHYSICAL EXAM Filed Vitals:   01/13/13 2015 01/13/13 2300 01/14/13 0400 01/14/13 0743  BP: 133/65 140/64 120/94   Pulse: 83 70 143   Temp: 98.2 F (36.8 C) 98.3 F (36.8 C) 97.7 F (36.5 C) 98.1 F (36.7 C)  TempSrc: Oral Oral Oral Oral  Resp: 18 17 23    Height:      Weight:   171 lb 11.8 oz (77.9 kg)   SpO2: 97% 97% 98%    General: No distress Lungs:  Clear Heart:   RRR Abdomen:  Positive bowel sounds, no rebound no guarding Extremities:  No edema  LABS: Lab Results  Component Value Date   TROPONINI 16.87* 01/13/2013   Results for orders placed during the hospital encounter of 01/12/13 (from the past 24 hour(s))  GLUCOSE, CAPILLARY     Status: Abnormal   Collection Time    01/13/13  9:07 AM      Result Value Range   Glucose-Capillary 212 (*) 70 - 99 mg/dL  GLUCOSE, CAPILLARY     Status: Abnormal   Collection Time    01/13/13 11:13 AM      Result Value Range   Glucose-Capillary 171 (*) 70 - 99 mg/dL   Comment 1 Documented in Chart     Comment 2 Notify RN    HEPARIN LEVEL (UNFRACTIONATED)     Status: None   Collection Time    01/13/13 12:50 PM      Result Value Range   Heparin Unfractionated 0.32  0.30 - 0.70 IU/mL  TROPONIN I     Status: Abnormal   Collection Time    01/13/13  2:31 PM      Result Value Range   Troponin I 16.87 (*) <0.30 ng/mL  GLUCOSE, CAPILLARY     Status: Abnormal   Collection Time    01/13/13  5:18 PM      Result Value Range   Glucose-Capillary 212 (*) 70 - 99 mg/dL   Comment 1 Notify RN    GLUCOSE, CAPILLARY     Status: Abnormal   Collection Time    01/13/13  9:58 PM      Result Value Range   Glucose-Capillary 160 (*) 70 - 99 mg/dL  HEPARIN LEVEL (UNFRACTIONATED)     Status: Abnormal   Collection Time    01/14/13  5:00 AM      Result Value Range   Heparin Unfractionated 0.18 (*) 0.30 - 0.70 IU/mL  CBC     Status: None   Collection Time    01/14/13  5:00 AM   Result Value Range   WBC 7.6  4.0 - 10.5 K/uL   RBC 4.23  3.87 - 5.11 MIL/uL   Hemoglobin 13.0  12.0 - 15.0 g/dL   HCT 40.9  81.1 - 91.4 %   MCV 91.5  78.0 - 100.0 fL   MCH 30.7  26.0 - 34.0 pg   MCHC 33.6  30.0 - 36.0 g/dL   RDW 78.2  95.6 - 21.3 %   Platelets 177  150 - 400 K/uL  BASIC METABOLIC PANEL     Status: Abnormal   Collection Time    01/14/13  5:25 AM      Result Value Range   Sodium 137  135 - 145 mEq/L   Potassium 4.5  3.5 - 5.1 mEq/L   Chloride 105  96 - 112 mEq/L   CO2 22  19 - 32 mEq/L   Glucose, Bld 168 (*) 70 - 99 mg/dL   BUN 24 (*) 6 - 23 mg/dL   Creatinine, Ser 1.61 (*) 0.50 - 1.10 mg/dL   Calcium 9.2  8.4 - 09.6 mg/dL   GFR calc non Af Amer 29 (*) >90 mL/min   GFR calc Af Amer 34 (*) >90 mL/min    Intake/Output Summary (Last 24 hours) at 01/14/13 0746 Last data filed at 01/14/13 0600  Gross per 24 hour  Intake  889.5 ml  Output   1450 ml  Net -560.5 ml   ASSESSMENT AND PLAN:  Principal Problem:  NSTEMI (non-ST elevated myocardial infarction):  Agrees to cath.  She understands that we will limit the contrast but that there is a risk of progressive renal insufficiency.  The patient understands that risks included but are not limited to stroke (1 in 1000), death (1 in 1000), kidney failure [usually temporary] (1 in 500 or higher in her case), bleeding (1 in 200), allergic reaction [possibly serious] (1 in 200).  The patient understands and agrees to proceed.   CAD (coronary artery disease):  See above  CKD (chronic kidney disease), stage III:   Creat is better.  Hydrated.     Rollene Rotunda 01/14/2013 7:46 AM

## 2013-01-14 NOTE — Care Management Note (Signed)
    Page 1 of 1   01/14/2013     4:23:19 PM   CARE MANAGEMENT NOTE 01/14/2013  Patient:  Catherine Mason, Catherine Mason   Account Number:  [id number]  Date Initiated:  01/14/2013  Documentation initiated by:  Oletta Cohn  Subjective/Objective Assessment:   79 yo female admitted with CP/ home alone (children, grandchildren nearby)     Action/Plan:   Heart Cath/ home with Brilinta   Anticipated DC Date:  01/16/2013   Anticipated DC Plan:  HOME/SELF CARE      DC Planning Services  CM consult      Choice offered to / List presented to:             Status of service:   Medicare Important Message given?   (If response is "NO", the following Medicare IM given date fields will be blank) Date Medicare IM given:   Date Additional Medicare IM given:    Discharge Disposition:    Per UR Regulation:    If discussed at Long Length of Stay Meetings, dates discussed:    Comments:  01/14/13 @ 1330.Marland KitchenMarland KitchenCamellia Wood,RN, BSN, [employer] [phone] Spoke with pt and grandsons at bedside concerning discharge planning.  Pt utilizes [facility] [place]. NCM called pharmacy to confirm medication in stock.  Will notify pt of benefits check when it becomes available.

## 2013-01-15 ENCOUNTER — Telehealth: Payer: Self-pay | Admitting: Cardiology

## 2013-01-15 DIAGNOSIS — E039 Hypothyroidism, unspecified: Secondary | ICD-10-CM | POA: Diagnosis present

## 2013-01-15 DIAGNOSIS — E119 Type 2 diabetes mellitus without complications: Secondary | ICD-10-CM | POA: Diagnosis not present

## 2013-01-15 DIAGNOSIS — I255 Ischemic cardiomyopathy: Secondary | ICD-10-CM | POA: Diagnosis present

## 2013-01-15 DIAGNOSIS — K439 Ventral hernia without obstruction or gangrene: Secondary | ICD-10-CM | POA: Diagnosis not present

## 2013-01-15 DIAGNOSIS — I214 Non-ST elevation (NSTEMI) myocardial infarction: Secondary | ICD-10-CM

## 2013-01-15 LAB — CBC
HCT: 36.6 % (ref 36.0–46.0)
MCHC: 33.6 g/dL (ref 30.0–36.0)
RDW: 13.3 % (ref 11.5–15.5)

## 2013-01-15 LAB — BASIC METABOLIC PANEL
BUN: 24 mg/dL — ABNORMAL HIGH (ref 6–23)
Calcium: 9.1 mg/dL (ref 8.4–10.5)
GFR calc Af Amer: 30 mL/min — ABNORMAL LOW (ref >90)
GFR calc non Af Amer: 26 mL/min — ABNORMAL LOW (ref >90)
Potassium: 3.6 mEq/L (ref 3.5–5.1)
Sodium: 138 mEq/L (ref 135–145)

## 2013-01-15 MED ORDER — CLOPIDOGREL BISULFATE 75 MG PO TABS: 75.0000 mg | ORAL_TABLET | Freq: Every day | ORAL | Status: DC

## 2013-01-15 MED ORDER — ATORVASTATIN CALCIUM 80 MG PO TABS: 80.0000 mg | ORAL_TABLET | Freq: Every day | ORAL | Status: DC

## 2013-01-15 MED ORDER — NITROGLYCERIN 0.4 MG SL SUBL: 0.4000 mg | SUBLINGUAL_TABLET | SUBLINGUAL | Status: DC | PRN

## 2013-01-15 MED FILL — Sodium Chloride IV Soln 0.9%: INTRAVENOUS | Qty: 50 | Status: AC

## 2013-01-15 NOTE — Discharge Summary (Signed)
CARDIOLOGY DISCHARGE SUMMARY   Patient ID: Catherine Mason MRN: [id number] DOB/AGE: 10/02/1927 79 y.o.  Admit date: 01/12/2013 Discharge date: 01/15/2013  Primary Discharge Diagnosis:    NSTEMI (non-ST elevated myocardial infarction) - status post drug-eluting stent to the LAD  Secondary Discharge Diagnosis:    Abdominal wall hernia at previous stoma site   CAD (coronary artery disease)   HTN (hypertension)   Hyperlipidemia   CKD (chronic kidney disease), stage III   Ischemic cardiomyopathy   Hypothyroidism  Procedures:  Left Heart Cath, Selective Coronary Angiography, LV angiography, PTCA and stenting of the proximal LAD, 2-D echocardiogram  Hospital Course: Catherine Mason is a 79 y.o. female with a history of CAD. She had onset of substernal chest pain and took an aspirin. When her symptoms did not resolve, she called EMS. She was given nitroglycerin plus more nitroglycerin and was pain-free upon arrival to the hospital. Her ECG did not show ST elevation. She was admitted for further evaluation and treatment.  She was continued on aspirin and nitrates and started on heparin plus a statin. Her cardiac enzymes trended up indicating a non-ST segment elevation MI. An echocardiogram was performed which showed left ventricular dysfunction, EF 35-40%. Because of her renal insufficiency, there was careful consideration of the risks and benefits to cardiac catheterization, but it was felt to be the best option. Limited contrast was used. She was hydrated and her renal function remained stable.  With the ischemic cardiomyopathy, consideration was given to an ACE inhibitor or an ARB but these meds were not used because of her renal insufficiency. She had been on a beta blocker prior to admission and this was continued. She did not have problems with volume overload and a diuretic was not needed.   Because of her hypothyroidism, TSH was checked and was within normal limits Sinemet changes were  made. Her hypertensive medications were continued and her blood pressure was generally well-controlled. She had some abdominal pain on admission so an x-ray was performed that showed no acute problems and she is encouraged to continue her current bowel regimen. Her blood sugars were managed with a combination of her home medications and sliding scale insulin. Her hemoglobin A1c was 7.9. She is encouraged to follow a diabetic diet and follow up with her primary care physician.  On 01/14/2013, she was taken to the cath lab with full cardiac catheterization results below. She tolerated the procedure well.   She was seen by cardiac rehabilitation and educated on MI restrictions, stent guidelines and lifestyle modifications. She was weak but able to ambulate. She was evaluated by Dr. Antoine Poche and considered stable for discharge, to follow up as an outpatient.  Labs:  Lab Results  Component Value Date   WBC 7.7 01/15/2013   HGB 12.3 01/15/2013   HCT 36.6 01/15/2013   MCV 90.8 01/15/2013   PLT 165 01/15/2013    Recent Labs Lab 01/13/13 0035  01/15/13 0530  NA 137  < > 138  K 3.8  < > 3.6  CL 105  < > 105  CO2 24  < > 20  BUN 29*  < > 24*  CREATININE 1.79*  < > 1.73*  CALCIUM 10.0  < > 9.1  PROT 6.6  --   --   BILITOT 0.4  --   --   ALKPHOS 60  --   --   ALT 30  --   --   AST 116*  --   --  GLUCOSE 191*  < > 178*  < > = values in this interval not displayed.  Recent Labs  01/13/13 0035 01/13/13 0419 01/13/13 1431  TROPONINI >20.00* >20.00* 16.87*   Lipid Panel     Component Value Date/Time   CHOL 143 01/13/2013 0430   TRIG 194* 01/13/2013 0430   HDL 44 01/13/2013 0430   CHOLHDL 3.3 01/13/2013 0430   VLDL 39 01/13/2013 0430   LDLCALC 60 01/13/2013 0430    Pro B Natriuretic peptide (BNP)  Date/Time Value Range Status  01/13/2013 12:35 AM 1405.0* 0 - 450 pg/mL Final    Recent Labs  01/13/13 0430  INR 1.08   Lab Results  Component Value Date   HGBA1C 7.9* 01/13/2013   Lab  Results  Component Value Date   TSH 1.974 01/13/2013     Radiology: Dg Chest 2 View 01/12/2013   *RADIOLOGY REPORT*  Clinical Data: Chest pain.  Hypertension.  Diabetes.  Nonsmoker.  CHEST - 2 VIEW  Comparison: 07/20/2010  Findings: Atelectasis or fibrosis in the lung bases.  Normal heart size and pulmonary vascularity.  No focal consolidation or airspace disease.  No blunting of costophrenic angles.  No pneumothorax. Degenerative changes in the spine.  No significant change since previous study.  IMPRESSION: Fibrosis or atelectasis in the lung bases.  No evidence of active consolidation.   Original Report Authenticated By: Burman Nieves, M.D.   Dg Abd 1 View 01/12/2013   *RADIOLOGY REPORT*  Clinical Data: Abdominal pain.  Chest pain.  Previous history of colon rupture, hernia, colostomy, and hysterectomy.  ABDOMEN - 1 VIEW  Comparison: Barium enema 05/03/2010.  CT abdomen and pelvis 11/05/2009.  Findings: Stool filled colon.  No small or large bowel distension. Stomach bubble is prominent but likely within normal limits.  No radiopaque stones.  Calcified phleboliths in the pelvis. Degenerative changes and scoliosis of the lumbar spine. Degenerative changes in the hips.  IMPRESSION: Stool filled colon.  Nonobstructive bowel gas pattern.   Original Report Authenticated By: Burman Nieves, M.D.     Cardiac Cath: 01/14/2013 Left mainstem: LM short and normal  Left anterior descending (LAD): LAD was a narrow vessel but wrapped the apex. There was a proximal 99% focal stenosis. The vessel proximal to this had diffuse 50 - 60% stenosis. D1 was very proximal, large with proximal luminal irregularities. D2 was small to moderate and normal.  Left circumflex (LCx): AV groove with mid luminal irregularities. MOM moderate sized and normal. PL small and normal  Right coronary artery (RCA): Large dominant vessel. Proximal stent widely patent. Mild luminal irregularities. PDA moderate sized and normal.  Left  ventriculography: Left ventricular not injected secondary to CKD.  Lesion Data:  Vessel: LAD/prox  Percent stenosis (pre): 95  TIMI-flow (pre): 3  Stent: 2.5x32 mm DES (Promus Premier)  Percent stenosis (post): 0  TIMI-flow (post): 3  Conclusions: Successful PCI of the proximal LAD as above  Recommendations: ASA/brilinta x 12 months minimum.  EKG: 07/152014  sinus rhythm, rate 68,  prolonged QT, evolving anterior MI changes  Echo: 01/14/2013 Study Conclusions - Left ventricle: Septal and apical akinesis The cavity size was mildly dilated. Wall thickness was normal. Systolic function was moderately reduced. The estimated ejection fraction was in the range of 35% to 40%. - Mitral valve: Calcified annulus. Mild regurgitation. - Left atrium: The atrium was mildly dilated. - Atrial septum: No defect or patent foramen ovale was identified. - Pulmonary arteries: PA peak pressure: 42mm Hg (S).   FOLLOW UP  PLANS AND APPOINTMENTS Allergies  Allergen Reactions  . Shellfish Allergy Diarrhea     Medication List         amLODipine 5 MG tablet  Commonly known as:  NORVASC  Take 5 mg by mouth daily.     aspirin EC 81 MG tablet  Take 81 mg by mouth daily.     atorvastatin 80 MG tablet  Commonly known as:  LIPITOR  Take 1 tablet (80 mg total) by mouth daily.     cholecalciferol 1000 UNITS tablet  Commonly known as:  VITAMIN D  Take 1,000 Units by mouth daily.     clopidogrel 75 MG tablet  Commonly known as:  PLAVIX  Take 1 tablet (75 mg total) by mouth daily.     fenofibrate 160 MG tablet  Take 160 mg by mouth daily.     glimepiride 2 MG tablet  Commonly known as:  AMARYL  Take 2 mg by mouth daily before breakfast.     levothyroxine 112 MCG tablet  Commonly known as:  SYNTHROID, LEVOTHROID  Take 112 mcg by mouth daily before breakfast.     metoprolol succinate 50 MG 24 hr tablet  Commonly known as:  TOPROL-XL  Take 50 mg by mouth 2 (two) times daily. Take with or  immediately following a meal.     nitroGLYCERIN 0.4 MG SL tablet  Commonly known as:  NITROSTAT  Place 1 tablet (0.4 mg total) under the tongue every 5 (five) minutes as needed for chest pain.         Future Appointments Provider Department Dept Phone   01/22/2013 9:00 AM Minda Meo, PA-C [facility] Main Office [place]) [phone]     Follow-up Information   Follow up with Rick Duff, PA-C On 01/22/2013. (See for Dr Swaziland at 9:00 am)    Contact information:   [address] [place] [address] [phone]       BRING ALL MEDICATIONS WITH YOU TO FOLLOW UP APPOINTMENTS  Time spent with patient to include physician time: 43 min Signed: Theodore Demark, PA-C 01/15/2013, 9:48 AM Co-Sign MD  Patient seen and examined.  Plan as discussed in my rounding note for today and outlined above. Fayrene Fearing [facility]  01/15/2013  9:52 AM

## 2013-01-15 NOTE — Progress Notes (Signed)
SUBJECTIVE:  No further chest pain.  No SOB.  No distress. Walked.  She is weak.    PHYSICAL EXAM Filed Vitals:   01/14/13 1703 01/14/13 2053 01/15/13 0013 01/15/13 0454  BP: 163/63 154/69 134/50 141/59  Pulse: 68 74 74 72  Temp: 97.7 F (36.5 C) 97.8 F (36.6 C) 97.3 F (36.3 C) 98.1 F (36.7 C)  TempSrc: Oral Oral Oral Oral  Resp: 18 18 20 20   Height:      Weight:   168 lb 10.4 oz (76.5 kg)   SpO2: 98% 100% 93% 97%   General: No distress Lungs:  Clear Heart:   RRR Abdomen:  Positive bowel sounds, no rebound no guarding Extremities:  No edema, right groin OK  LABS:  Results for orders placed during the hospital encounter of 01/12/13 (from the past 24 hour(s))  POCT ACTIVATED CLOTTING TIME     Status: None   Collection Time    01/14/13 11:50 AM      Result Value Range   Activated Clotting Time 375    GLUCOSE, CAPILLARY     Status: Abnormal   Collection Time    01/14/13  1:01 PM      Result Value Range   Glucose-Capillary 153 (*) 70 - 99 mg/dL  GLUCOSE, CAPILLARY     Status: Abnormal   Collection Time    01/14/13  6:01 PM      Result Value Range   Glucose-Capillary 153 (*) 70 - 99 mg/dL  GLUCOSE, CAPILLARY     Status: Abnormal   Collection Time    01/14/13  9:27 PM      Result Value Range   Glucose-Capillary 193 (*) 70 - 99 mg/dL   Comment 1 Notify RN     Comment 2 Documented in Chart    CBC     Status: None   Collection Time    01/15/13  5:30 AM      Result Value Range   WBC 7.7  4.0 - 10.5 K/uL   RBC 4.03  3.87 - 5.11 MIL/uL   Hemoglobin 12.3  12.0 - 15.0 g/dL   HCT 21.3  08.6 - 57.8 %   MCV 90.8  78.0 - 100.0 fL   MCH 30.5  26.0 - 34.0 pg   MCHC 33.6  30.0 - 36.0 g/dL   RDW 46.9  62.9 - 52.8 %   Platelets 165  150 - 400 K/uL  BASIC METABOLIC PANEL     Status: Abnormal   Collection Time    01/15/13  5:30 AM      Result Value Range   Sodium 138  135 - 145 mEq/L   Potassium 3.6  3.5 - 5.1 mEq/L   Chloride 105  96 - 112 mEq/L   CO2 20  19 - 32  mEq/L   Glucose, Bld 178 (*) 70 - 99 mg/dL   BUN 24 (*) 6 - 23 mg/dL   Creatinine, Ser 4.13 (*) 0.50 - 1.10 mg/dL   Calcium 9.1  8.4 - 24.4 mg/dL   GFR calc non Af Amer 26 (*) >90 mL/min   GFR calc Af Amer 30 (*) >90 mL/min    Intake/Output Summary (Last 24 hours) at 01/15/13 0102 Last data filed at 01/15/13 0100  Gross per 24 hour  Intake 1593.53 ml  Output   1350 ml  Net 243.53 ml   ASSESSMENT AND PLAN:   NSTEMI (non-ST elevated myocardial infarction):  PCI of LAD yesterday. Switched to Plavix  secondary to acute dyspnea on Brilinta.  OK to go home.   CAD (coronary artery disease):  See above  CKD (chronic kidney disease), stage III:   Creat stable day one after cath. We can follow this with repeat labs.   CARDIOMYOPATHY:  Ischemic.  EF 35 - 40%.  This seems to be unchanged from previous.   No ACE secondary to CKD.    OK to discharge on meds as listed.  Follow up in 10 days in the hospital.  (Transition of care).  Needs BMET in two days.  Send results to me.   Fayrene Fearing [facility] 01/15/2013 8:08 AM

## 2013-01-15 NOTE — Telephone Encounter (Signed)
New problem   Pt has 7 day TCM w/ Brooke on 01/22/13 per Bjorn Loser PA calling.

## 2013-01-15 NOTE — Progress Notes (Signed)
CARDIAC REHAB PHASE I   PRE:  Rate/Rhythm: 70SR  BP:  Supine: 153/62  Sitting:   Standing:    SaO2:   MODE:  Ambulation: 300 ft   POST:  Rate/Rhythm: 82SR  BP:  Supine:   Sitting: 113/32  Standing:    SaO2: 97%RA [id number] Pt walked 300 ft on RA with asst x 1 holding to side rail at times. Has cane at home that she uses when walking outside of house. Denied CP but very tired by the end of the walk. Stated SOB better, sats at 97%RA. To recliner after walk. Education completed. Encouraged pt to weigh self daily and watch sodium,. She does not count carbs but just eats what she feels will not raise her sugar. Briefly reviewed healthy choices and left diabetic and heart healthy diets. Discussed CRP 2. Pt declined as she felt it would be too much for her. Lives alone. Discussed with pt MI restrictions and need to have someone help take care of her still feeling stronger.    Luetta Nutting, RN BSN  01/15/2013 8:49 AM

## 2013-01-16 NOTE — Telephone Encounter (Signed)
I spoke with the pt and she is doing well today. The pt is taking her medications as directed and did not have any questions about her medications.  The pt is aware of her appointment that is scheduled on 01/22/13. The pt will call the office with any other questions or concerns.

## 2013-01-17 DIAGNOSIS — E1129 Type 2 diabetes mellitus with other diabetic kidney complication: Secondary | ICD-10-CM | POA: Diagnosis not present

## 2013-01-21 NOTE — Progress Notes (Addendum)
CARDIOLOGY OFFICE NOTE  Patient ID: Catherine Mason MRN: [id number], DOB/AGE: [birth year]   Date of Visit: 01/22/2013  Primary Physician: Julian Hy, MD Primary Cardiologist: Peter Swaziland, MD Reason for Visit: Hospital follow-up  History of Present Illness  Catherine Mason is a 79 y.o. female with CAD, ischemic CM, HTN, dyslipidemia and CKD stage III who presents today for hospital followup. She is accompanied by her grandson. She was admitted to [facility] on 01/12/2013 with CP. She ruled in for NSTEMI and underwent cardiac catheterization. She is now s/p PCI / DES to LAD.   Since discharge, she reports she is doing well and continues to work on building up her LE strength. She is walking twice daily as directed by cardiac rehab DC instructions. She has lots of support at home. She had a brief episode of chest pain yesterday afternoon which she describes as squeezing under her breasts. She reports this feels very different than her angina. It lasted 1-2 minutes and resolved on its own without intervention. She feels it was related to anxiety as she has been under a lot of stress recently taking care of her daughter who has dementia. She denies any accompanying symptoms such as SOB, palpitations, dizziness, near syncope or syncope. She denies nausea or diaphoresis. She denies shortness of breath. She denies palpitations, dizziness, near syncope or syncope. She denies LE swelling, orthopnea, PND or recent weight gain. She is compliant and tolerating medications without difficulty.  Past Medical History Past Medical History  Diagnosis Date  . Diabetes mellitus   . Thyroid disease   . Uterine cancer   . Ventral hernia   . CAD (coronary artery disease)     PCI 2003 2.75x13, 2.75x28 Zeta stents mid RCA  . HTN (hypertension)   . Hyperlipidemia   . SVT (supraventricular tachycardia)   . Diverticular disease   . Abdominal wall hernia at previous stoma site   . CKD (chronic kidney disease),  stage III   . Hypothyroid   . Anemia     Past Surgical History Past Surgical History  Procedure Laterality Date  . Abdominal hysterectomy    . Coronary stents    . Colon surgery      colectomy  . Colostomy reversal.      Allergies/Intolerances Allergies  Allergen Reactions  . Shellfish Allergy Diarrhea   Current Home Medications Current Outpatient Prescriptions  Medication Sig Dispense Refill  . amLODipine (NORVASC) 5 MG tablet Take 5 mg by mouth daily.      Marland Kitchen aspirin EC 81 MG tablet Take 81 mg by mouth daily.      Marland Kitchen atorvastatin (LIPITOR) 80 MG tablet Take 1 tablet (80 mg total) by mouth daily.  30 tablet  11  . cholecalciferol (VITAMIN D) 1000 UNITS tablet Take 1,000 Units by mouth daily.      . clopidogrel (PLAVIX) 75 MG tablet Take 1 tablet (75 mg total) by mouth daily.  30 tablet  11  . fenofibrate 160 MG tablet Take 160 mg by mouth daily.       Marland Kitchen glimepiride (AMARYL) 2 MG tablet Take 2 mg by mouth daily before breakfast.       . levothyroxine (SYNTHROID, LEVOTHROID) 112 MCG tablet Take 112 mcg by mouth daily before breakfast.      . metoprolol succinate (TOPROL-XL) 50 MG 24 hr tablet Take 50 mg by mouth 2 (two) times daily. Take with or immediately following a meal.      . nitroGLYCERIN (NITROSTAT) 0.4  MG SL tablet Place 1 tablet (0.4 mg total) under the tongue every 5 (five) minutes as needed for chest pain.  25 tablet  3   No current facility-administered medications for this visit.   Social History History   Social History  . Marital Status: Widowed    Spouse Name: N/A    Number of Children: N/A  . Years of Education: N/A   Occupational History  . Not on file.   Social History Main Topics  . Smoking status: Never Smoker   . Smokeless tobacco: Never Used  . Alcohol Use: No  . Drug Use: No  . Sexually Active: Not on file   Other Topics Concern  . Not on file   Social History Narrative  . No narrative on file    Review of Systems General: No chills,  fever, night sweats or weight changes Cardiovascular: No chest pain, dyspnea on exertion, edema, orthopnea, palpitations, paroxysmal nocturnal dyspnea Dermatological: No rash, lesions or masses Respiratory: No cough, dyspnea Urologic: No hematuria, dysuria Abdominal: No nausea, vomiting, diarrhea, bright red blood per rectum, melena, or hematemesis Neurologic: No visual changes, weakness, changes in mental status All other systems reviewed and are otherwise negative except as noted above.  Physical Exam Vitals: Blood pressure 128/69, pulse 79, height 5\' 4"  (1.626 m), weight 168 lb 3.2 oz (76.295 kg), SpO2 96.00%.  General: Well developed, well appearing 79 y.o. female in no acute distress. HEENT: Normocephalic, atraumatic. EOMs intact. Sclera nonicteric. Oropharynx clear.  Neck: Supple. No JVD. Lungs: Respirations regular and unlabored, CTA bilaterally. No wheezes, rales or rhonchi. Heart: RRR. S1, S2 present. No murmurs, rub, S3 or S4. Abdomen: Soft, non-distended.  Extremities: No clubbing, cyanosis or edema. PT/Radials 2+ and equal bilaterally. Psych: Normal affect. Neuro: Alert and oriented X 3. Moves all extremities spontaneously.   Diagnostics Echocardiogram 01/14/2013 Study Conclusions - Left ventricle: Septal and apical akinesis The cavity size was mildly dilated. Wall thickness was normal. Systolic function was moderately reduced. The estimated ejection fraction was in the range of 35% to 40%. - Mitral valve: Calcified annulus. Mild regurgitation. - Left atrium: The atrium was mildly dilated. - Atrial septum: No defect or patent foramen ovale was identified. - Pulmonary arteries: PA peak pressure: 42mm Hg (S).   Assessment and Plan 1. CAD s/p recent NSTEMI and PCI / DES to LAD Stable without anginal symptoms Continue medical therapy Follow-up with Dr. Swaziland in 6 weeks Ms. Mcquary will let us know if she would like a referral for cardiac rehab 2. Ischemic CM, EF  35-40% with chronic systolic HF Stable without worsening HF symptoms; euvolemic by exam Continue medical therapy Repeat echo in 3 months to reassess LV function post PCI 3. HTN Normotensive today Continue current regimen  Signed, Reigan Tolliver, PA-C 01/22/2013, 9:09 AM

## 2013-01-22 ENCOUNTER — Ambulatory Visit (INDEPENDENT_AMBULATORY_CARE_PROVIDER_SITE_OTHER): Payer: Medicare Other | Admitting: Cardiology

## 2013-01-22 ENCOUNTER — Encounter: Payer: Self-pay | Admitting: Cardiology

## 2013-01-22 VITALS — BP 128/69 | HR 79 | Ht 64.0 in | Wt 168.2 lb

## 2013-01-22 DIAGNOSIS — I5022 Chronic systolic (congestive) heart failure: Secondary | ICD-10-CM

## 2013-01-22 DIAGNOSIS — I2589 Other forms of chronic ischemic heart disease: Secondary | ICD-10-CM

## 2013-01-22 DIAGNOSIS — Z955 Presence of coronary angioplasty implant and graft: Secondary | ICD-10-CM

## 2013-01-22 DIAGNOSIS — Z9861 Coronary angioplasty status: Secondary | ICD-10-CM | POA: Diagnosis not present

## 2013-01-22 DIAGNOSIS — I255 Ischemic cardiomyopathy: Secondary | ICD-10-CM

## 2013-01-22 DIAGNOSIS — I2510 Atherosclerotic heart disease of native coronary artery without angina pectoris: Secondary | ICD-10-CM

## 2013-01-22 DIAGNOSIS — I10 Essential (primary) hypertension: Secondary | ICD-10-CM

## 2013-01-22 NOTE — Patient Instructions (Addendum)
NO CHANGES WERE MADE TODAY  LET us KNOW IF YOU CHANGE YOUR MIND ABOUT PHYSICAL THERAPY/CARDIAC REHAB  YOU HAVE A FOLLOW UP APPT WITH DR.JORDAN 02/27/13 @12PM 

## 2013-02-07 ENCOUNTER — Other Ambulatory Visit: Payer: Self-pay | Admitting: Cardiology

## 2013-02-11 ENCOUNTER — Other Ambulatory Visit: Payer: Self-pay | Admitting: Cardiology

## 2013-02-18 ENCOUNTER — Encounter: Payer: Self-pay | Admitting: Cardiovascular Disease

## 2013-02-20 DIAGNOSIS — E785 Hyperlipidemia, unspecified: Secondary | ICD-10-CM | POA: Diagnosis not present

## 2013-02-20 DIAGNOSIS — H409 Unspecified glaucoma: Secondary | ICD-10-CM | POA: Diagnosis not present

## 2013-02-20 DIAGNOSIS — I10 Essential (primary) hypertension: Secondary | ICD-10-CM | POA: Diagnosis not present

## 2013-02-20 DIAGNOSIS — C55 Malignant neoplasm of uterus, part unspecified: Secondary | ICD-10-CM | POA: Diagnosis not present

## 2013-02-20 DIAGNOSIS — E1129 Type 2 diabetes mellitus with other diabetic kidney complication: Secondary | ICD-10-CM | POA: Diagnosis not present

## 2013-02-20 DIAGNOSIS — E049 Nontoxic goiter, unspecified: Secondary | ICD-10-CM | POA: Diagnosis not present

## 2013-02-20 DIAGNOSIS — I2510 Atherosclerotic heart disease of native coronary artery without angina pectoris: Secondary | ICD-10-CM | POA: Diagnosis not present

## 2013-02-20 DIAGNOSIS — N058 Unspecified nephritic syndrome with other morphologic changes: Secondary | ICD-10-CM | POA: Diagnosis not present

## 2013-02-27 ENCOUNTER — Encounter: Payer: Self-pay | Admitting: Cardiology

## 2013-02-27 ENCOUNTER — Ambulatory Visit (INDEPENDENT_AMBULATORY_CARE_PROVIDER_SITE_OTHER): Payer: Medicare Other | Admitting: Cardiology

## 2013-02-27 ENCOUNTER — Other Ambulatory Visit: Payer: Self-pay | Admitting: Cardiology

## 2013-02-27 VITALS — BP 155/63 | HR 69 | Ht 64.0 in | Wt 165.0 lb

## 2013-02-27 DIAGNOSIS — I255 Ischemic cardiomyopathy: Secondary | ICD-10-CM

## 2013-02-27 DIAGNOSIS — I10 Essential (primary) hypertension: Secondary | ICD-10-CM | POA: Diagnosis not present

## 2013-02-27 DIAGNOSIS — I2589 Other forms of chronic ischemic heart disease: Secondary | ICD-10-CM

## 2013-02-27 DIAGNOSIS — E039 Hypothyroidism, unspecified: Secondary | ICD-10-CM

## 2013-02-27 DIAGNOSIS — I498 Other specified cardiac arrhythmias: Secondary | ICD-10-CM | POA: Diagnosis not present

## 2013-02-27 DIAGNOSIS — I214 Non-ST elevation (NSTEMI) myocardial infarction: Secondary | ICD-10-CM

## 2013-02-27 DIAGNOSIS — I2510 Atherosclerotic heart disease of native coronary artery without angina pectoris: Secondary | ICD-10-CM | POA: Diagnosis not present

## 2013-02-27 DIAGNOSIS — I471 Supraventricular tachycardia: Secondary | ICD-10-CM

## 2013-02-27 DIAGNOSIS — E785 Hyperlipidemia, unspecified: Secondary | ICD-10-CM | POA: Diagnosis not present

## 2013-02-27 DIAGNOSIS — N183 Chronic kidney disease, stage 3 unspecified: Secondary | ICD-10-CM

## 2013-02-27 LAB — BASIC METABOLIC PANEL
BUN: 27 mg/dL — ABNORMAL HIGH (ref 6–23)
Chloride: 108 mEq/L (ref 96–112)
Creatinine, Ser: 1.6 mg/dL — ABNORMAL HIGH (ref 0.4–1.2)
GFR: 31.8 mL/min — ABNORMAL LOW (ref >60.00)
Potassium: 4.7 mEq/L (ref 3.5–5.1)

## 2013-02-27 MED ORDER — CLOPIDOGREL BISULFATE 75 MG PO TABS: 75.0000 mg | ORAL_TABLET | Freq: Every day | ORAL | Status: DC

## 2013-02-27 MED ORDER — ATORVASTATIN CALCIUM 80 MG PO TABS: 80.0000 mg | ORAL_TABLET | Freq: Every day | ORAL | Status: DC

## 2013-02-27 NOTE — Progress Notes (Signed)
Catherine Mason Date of Birth: [birth year] Medical Record #[id number]  History of Present Illness: Catherine Mason is seen today for followup.  She has a history of coronary disease and is status post stenting of the right coronary in 2003 with overlapping bare-metal stents. This includes a 2.75 x 28 and 2.75 x 13 mm Zeta stents. In July she was admitted with a non-ST elevation myocardial infarction. Cardiac catheterization demonstrated 99% stenosis in the proximal LAD. The left circumflex and right coronaries were patent. Ejection fraction by echocardiogram was 35-40% with septal and apical akinesis. She underwent successful stenting of the proximal LAD with a drug-eluting stent on 01/14/2013. She is on antiplatelet therapy with aspirin and Plavix. Her presenting symptoms were of indigestion and pain radiating to her right shoulder and back. On followup today she reports she is feeling very well. She denies any dyspnea, chest pain, or recurrent indigestion or shoulder pain. She does have a history of chronic kidney disease stage III. In the hospital her creatinine was stable at 1.7. She has no symptoms of palpitations.  Current Outpatient Prescriptions on File Prior to Visit  Medication Sig Dispense Refill  . amLODipine (NORVASC) 5 MG tablet TAKE 1 TABLET BY MOUTH DAILY  90 tablet  1  . aspirin EC 81 MG tablet Take 81 mg by mouth daily.      . cholecalciferol (VITAMIN D) 1000 UNITS tablet Take 1,000 Units by mouth daily.      . fenofibrate 160 MG tablet Take 160 mg by mouth daily.       Marland Kitchen glimepiride (AMARYL) 2 MG tablet Take 2 mg by mouth daily before breakfast.       . levothyroxine (SYNTHROID, LEVOTHROID) 112 MCG tablet Take 112 mcg by mouth daily before breakfast.      . metoprolol succinate (TOPROL-XL) 50 MG 24 hr tablet Take 50 mg by mouth 2 (two) times daily. Take with or immediately following a meal.      . nitroGLYCERIN (NITROSTAT) 0.4 MG SL tablet Place 1 tablet (0.4 mg total) under the tongue  every 5 (five) minutes as needed for chest pain.  25 tablet  3   No current facility-administered medications on file prior to visit.    Allergies  Allergen Reactions  . Shellfish Allergy Diarrhea    Past Medical History  Diagnosis Date  . Diabetes mellitus   . Thyroid disease   . Uterine cancer   . Ventral hernia   . CAD (coronary artery disease)     PCI 2003 2.75x13, 2.75x28 Zeta stents mid RCA  . HTN (hypertension)   . Hyperlipidemia   . SVT (supraventricular tachycardia)   . Diverticular disease   . Abdominal wall hernia at previous stoma site   . CKD (chronic kidney disease), stage III   . Hypothyroid   . Anemia   . NSTEMI (non-ST elevated myocardial infarction) 7/14    DES LAD    Past Surgical History  Procedure Laterality Date  . Abdominal hysterectomy    . Coronary stents    . Colon surgery      colectomy  . Colostomy reversal.      History  Smoking status  . Never Smoker   Smokeless tobacco  . Never Used    History  Alcohol Use No    Family History  Problem Relation Age of Onset  . Heart disease Mother     heart attack  . Cancer Father     prostate  . Diabetes Father  Review of Systems: The review of systems is positive for chronic anemia and diabetes.  She has a history of hypothyroidism and is on replacement. She reports her last A1c was 7.6%. She has lost 10 pounds over the past year. All other systems were reviewed and are negative.  Physical Exam: BP 155/63  Pulse 69  Ht 5\' 4"  (1.626 m)  Wt 165 lb (74.844 kg)  BMI 28.31 kg/m2 She is a pleasant, obese white female in no acute distress. Her HEENT exam is unremarkable. Pupils are equal round and reactive to light accommodation. Extraocular movements are full. Oropharynx is clear. Neck is supple without JVD, adenopathy, thyromegaly, or bruits. Lungs are clear. Cardiac exam reveals a regular rate and rhythm without gallop, murmur, or click. Abdomen is soft and nontender. She has no  hepatosplenomegaly. She does have a large abdominal wall hernia in the left abdomen that her prior stoma site. It is easily reducible. Extremities are without edema. She has no groin hematoma. Pedal pulses are good. She is alert oriented x3. She has no focal neurologic findings.  LABORATORY DATA:   Assessment / Plan: 1. Coronary disease with recent NSTEMI status post DES of the proximal LAD. Prior stents in the right coronary were patent. She will need to continue on dual antiplatelet therapy for one year. Continue beta blocker therapy and statin therapy. I will followup again in 4 weeks.  2. Ischemic cardiomyopathy with moderate left ventricular dysfunction. Chronic systolic congestive heart failure. No evidence of volume overload at this point. We will continue beta blocker therapy. I'll recheck her renal function today. If her creatinine is stable I will plan on starting her on low-dose of ACE inhibitor with lisinopril 2.5 mg daily. We will need to monitor renal function closely. I will plan on repeating an echocardiogram in 3 months once we've optimized her medical therapy to reassess her LV function.  3. Dyslipidemia. Now on high-dose statin therapy with atorvastatin 80 mg daily. Also on fenofibrate.  4. Diabetes mellitus type 2.  5. Hypothyroidism.

## 2013-02-27 NOTE — Patient Instructions (Addendum)
We will check your kidney function today. If your kidney function is stable we will add another medication for your heart.  We will repeat an Echo in 3 months.  Continue your other therapy.  I will see you in 4 weeks.  Your physician recommends that you return for lab work in: bmet today  Your physician recommends that you continue on your current medications as directed. Please refer to the Current Medication list given to you today.

## 2013-03-05 ENCOUNTER — Other Ambulatory Visit: Payer: Self-pay

## 2013-03-05 DIAGNOSIS — I10 Essential (primary) hypertension: Secondary | ICD-10-CM

## 2013-03-05 MED ORDER — LISINOPRIL 2.5 MG PO TABS: 2.5000 mg | ORAL_TABLET | Freq: Every day | ORAL | Status: DC

## 2013-03-06 ENCOUNTER — Other Ambulatory Visit: Payer: Self-pay

## 2013-03-06 DIAGNOSIS — I255 Ischemic cardiomyopathy: Secondary | ICD-10-CM

## 2013-03-12 ENCOUNTER — Telehealth: Payer: Self-pay

## 2013-03-12 NOTE — Telephone Encounter (Signed)
Patient was called Dr.Jordan signed handicapp parking form.Form mailed to patient.

## 2013-03-20 ENCOUNTER — Other Ambulatory Visit (INDEPENDENT_AMBULATORY_CARE_PROVIDER_SITE_OTHER): Payer: Medicare Other

## 2013-03-20 DIAGNOSIS — I10 Essential (primary) hypertension: Secondary | ICD-10-CM | POA: Diagnosis not present

## 2013-03-20 LAB — BASIC METABOLIC PANEL
Calcium: 9.5 mg/dL (ref 8.4–10.5)
GFR: 29.89 mL/min — ABNORMAL LOW (ref >60.00)
Glucose, Bld: 268 mg/dL — ABNORMAL HIGH (ref 70–99)
Potassium: 5.2 mEq/L — ABNORMAL HIGH (ref 3.5–5.1)
Sodium: 137 mEq/L (ref 135–145)

## 2013-03-21 ENCOUNTER — Other Ambulatory Visit: Payer: Self-pay

## 2013-03-21 DIAGNOSIS — I10 Essential (primary) hypertension: Secondary | ICD-10-CM

## 2013-04-02 DIAGNOSIS — E119 Type 2 diabetes mellitus without complications: Secondary | ICD-10-CM | POA: Diagnosis not present

## 2013-04-02 DIAGNOSIS — H18419 Arcus senilis, unspecified eye: Secondary | ICD-10-CM | POA: Diagnosis not present

## 2013-04-02 DIAGNOSIS — H4010X0 Unspecified open-angle glaucoma, stage unspecified: Secondary | ICD-10-CM | POA: Diagnosis not present

## 2013-04-02 DIAGNOSIS — Z961 Presence of intraocular lens: Secondary | ICD-10-CM | POA: Diagnosis not present

## 2013-04-02 DIAGNOSIS — H409 Unspecified glaucoma: Secondary | ICD-10-CM | POA: Diagnosis not present

## 2013-04-03 ENCOUNTER — Ambulatory Visit (INDEPENDENT_AMBULATORY_CARE_PROVIDER_SITE_OTHER): Payer: Medicare Other | Admitting: Cardiology

## 2013-04-03 ENCOUNTER — Encounter: Payer: Self-pay | Admitting: Cardiology

## 2013-04-03 VITALS — BP 165/65 | HR 66 | Wt 165.0 lb

## 2013-04-03 DIAGNOSIS — I10 Essential (primary) hypertension: Secondary | ICD-10-CM | POA: Diagnosis not present

## 2013-04-03 DIAGNOSIS — N183 Chronic kidney disease, stage 3 unspecified: Secondary | ICD-10-CM

## 2013-04-03 DIAGNOSIS — I471 Supraventricular tachycardia, unspecified: Secondary | ICD-10-CM

## 2013-04-03 DIAGNOSIS — I498 Other specified cardiac arrhythmias: Secondary | ICD-10-CM

## 2013-04-03 DIAGNOSIS — E785 Hyperlipidemia, unspecified: Secondary | ICD-10-CM

## 2013-04-03 DIAGNOSIS — I2510 Atherosclerotic heart disease of native coronary artery without angina pectoris: Secondary | ICD-10-CM

## 2013-04-03 DIAGNOSIS — I255 Ischemic cardiomyopathy: Secondary | ICD-10-CM

## 2013-04-03 DIAGNOSIS — I2589 Other forms of chronic ischemic heart disease: Secondary | ICD-10-CM

## 2013-04-03 LAB — BASIC METABOLIC PANEL
CO2: 24 mEq/L (ref 19–32)
Chloride: 106 mEq/L (ref 96–112)
Potassium: 4.8 mEq/L (ref 3.5–5.1)

## 2013-04-03 NOTE — Progress Notes (Signed)
Catherine Mason Date of Birth: Nov 01, 1927 Medical Record #[id number]  History of Present Illness: Catherine Mason is seen today for followup.  She has a history of coronary disease and is status post stenting of the right coronary in 2003 with overlapping bare-metal stents. This includes a 2.75 x 28 and 2.75 x 13 mm Zeta stents. In July she was admitted with a non-ST elevation myocardial infarction. Cardiac catheterization demonstrated 99% stenosis in the proximal LAD. The left circumflex and right coronaries were patent. Ejection fraction by echocardiogram was 35-40% with septal and apical akinesis. She underwent successful stenting of the proximal LAD with a drug-eluting stent on 01/14/2013. She is on antiplatelet therapy with aspirin and Plavix.  On followup today she states she is doing well. She denies any symptoms of chest pain or shortness of breath. 2 nights ago she states she was half asleep and she noted she was breathing fast. She really denied any dyspnea or other symptoms at that time. Her blood pressure at home has been ranging from 135-144 systolic. On her last visit we started her on lisinopril. She has been tolerating this well. Followup creatinine was unchanged. Her potassium was mildly elevated at 5.2.  Current Outpatient Prescriptions on File Prior to Visit  Medication Sig Dispense Refill  . amLODipine (NORVASC) 5 MG tablet TAKE 1 TABLET BY MOUTH DAILY  90 tablet  1  . aspirin EC 81 MG tablet Take 81 mg by mouth daily.      Marland Kitchen atorvastatin (LIPITOR) 80 MG tablet Take 1 tablet (80 mg total) by mouth daily.  90 tablet  2  . cholecalciferol (VITAMIN D) 1000 UNITS tablet Take 1,000 Units by mouth daily.      . clopidogrel (PLAVIX) 75 MG tablet Take 1 tablet (75 mg total) by mouth daily.  90 tablet  1  . fenofibrate 160 MG tablet Take 160 mg by mouth daily.       Marland Kitchen glimepiride (AMARYL) 2 MG tablet Take 2 mg by mouth daily before breakfast.       . levothyroxine (SYNTHROID, LEVOTHROID) 112 MCG  tablet Take 112 mcg by mouth daily before breakfast.      . lisinopril (PRINIVIL,ZESTRIL) 2.5 MG tablet Take 1 tablet (2.5 mg total) by mouth daily.  30 tablet  6  . metoprolol succinate (TOPROL-XL) 50 MG 24 hr tablet Take 50 mg by mouth 2 (two) times daily. Take with or immediately following a meal.      . nitroGLYCERIN (NITROSTAT) 0.4 MG SL tablet Place 1 tablet (0.4 mg total) under the tongue every 5 (five) minutes as needed for chest pain.  25 tablet  3  . travoprost, benzalkonium, (TRAVATAN) 0.004 % ophthalmic solution Place 1 drop into both eyes at bedtime.       No current facility-administered medications on file prior to visit.    Allergies  Allergen Reactions  . Shellfish Allergy Diarrhea    Past Medical History  Diagnosis Date  . Diabetes mellitus   . Thyroid disease   . Uterine cancer   . Ventral hernia   . CAD (coronary artery disease)     PCI 2003 2.75x13, 2.75x28 Zeta stents mid RCA  . HTN (hypertension)   . Hyperlipidemia   . SVT (supraventricular tachycardia)   . Diverticular disease   . Abdominal wall hernia at previous stoma site   . CKD (chronic kidney disease), stage III   . Hypothyroid   . Anemia   . NSTEMI (non-ST elevated myocardial infarction) 7/14  DES LAD    Past Surgical History  Procedure Laterality Date  . Abdominal hysterectomy    . Coronary stents    . Colon surgery      colectomy  . Colostomy reversal.      History  Smoking status  . Never Smoker   Smokeless tobacco  . Never Used    History  Alcohol Use No    Family History  Problem Relation Age of Onset  . Heart disease Mother     heart attack  . Cancer Father     prostate  . Diabetes Father     Review of Systems: As noted in history of present illness. All other systems were reviewed and are negative.  Physical Exam: BP 165/65  Pulse 66  Wt 165 lb (74.844 kg)  BMI 28.31 kg/m2 She is a pleasant, obese white female in no acute distress. Her HEENT exam is  unremarkable. Pupils are equal round and reactive to light accommodation. Extraocular movements are full. Oropharynx is clear. Neck is supple without JVD, adenopathy, thyromegaly, or bruits. Lungs are clear. Cardiac exam reveals a regular rate and rhythm without gallop, murmur, or click. Abdomen is soft and nontender. She has no hepatosplenomegaly. She does have a large abdominal wall hernia in the left abdomen that her prior stoma site. It is easily reducible. Extremities are without edema. She has no groin hematoma. Pedal pulses are good. She is alert oriented x3. She has no focal neurologic findings.  LABORATORY DATA: Lab Results  Component Value Date   WBC 7.7 01/15/2013   HGB 12.3 01/15/2013   HCT 36.6 01/15/2013   PLT 165 01/15/2013   GLUCOSE 254* 04/03/2013   CHOL 143 01/13/2013   TRIG 194* 01/13/2013   HDL 44 01/13/2013   LDLCALC 60 01/13/2013   ALT 30 01/13/2013   AST 116* 01/13/2013   NA 139 04/03/2013   K 4.8 04/03/2013   CL 106 04/03/2013   CREATININE 1.8* 04/03/2013   BUN 28* 04/03/2013   CO2 24 04/03/2013   TSH 1.974 01/13/2013   INR 1.08 01/13/2013   HGBA1C 7.9* 01/13/2013     Assessment / Plan: 1. Coronary disease with  NSTEMI in July of 2014 status post DES of the proximal LAD. Prior stents in the right coronary were patent. She will need to continue on dual antiplatelet therapy for one year. Continue beta blocker therapy and statin therapy. I will followup again in 3 months.  2. Ischemic cardiomyopathy with moderate left ventricular dysfunction. Chronic systolic congestive heart failure. No evidence of volume overload at this point. We will continue beta blocker and low-dose ACE inhibitor therapy. We will check an echocardiogram in November  to reassess her LV function. She is not a candidate for Aldactone due to to her renal insufficiency and tendency to hyperkalemia. Potassium level has improved today.  3. Dyslipidemia. Now on high-dose statin therapy with atorvastatin 80 mg daily.  Also on fenofibrate.  4. Diabetes mellitus type 2.  5. Hypothyroidism.

## 2013-04-03 NOTE — Patient Instructions (Signed)
Continue your current therapy  We will check your potassium today.  I will see you in 3 months.

## 2013-04-19 ENCOUNTER — Other Ambulatory Visit: Payer: Medicare Other

## 2013-05-20 ENCOUNTER — Ambulatory Visit (HOSPITAL_COMMUNITY): Payer: Medicare Other | Attending: Cardiology | Admitting: Radiology

## 2013-05-20 ENCOUNTER — Encounter: Payer: Self-pay | Admitting: Cardiology

## 2013-05-20 DIAGNOSIS — I10 Essential (primary) hypertension: Secondary | ICD-10-CM | POA: Diagnosis not present

## 2013-05-20 DIAGNOSIS — I252 Old myocardial infarction: Secondary | ICD-10-CM | POA: Insufficient documentation

## 2013-05-20 DIAGNOSIS — I255 Ischemic cardiomyopathy: Secondary | ICD-10-CM

## 2013-05-20 DIAGNOSIS — I079 Rheumatic tricuspid valve disease, unspecified: Secondary | ICD-10-CM | POA: Insufficient documentation

## 2013-05-20 DIAGNOSIS — I498 Other specified cardiac arrhythmias: Secondary | ICD-10-CM | POA: Diagnosis not present

## 2013-05-20 DIAGNOSIS — E119 Type 2 diabetes mellitus without complications: Secondary | ICD-10-CM | POA: Diagnosis not present

## 2013-05-20 DIAGNOSIS — I059 Rheumatic mitral valve disease, unspecified: Secondary | ICD-10-CM | POA: Diagnosis not present

## 2013-05-20 DIAGNOSIS — I2510 Atherosclerotic heart disease of native coronary artery without angina pectoris: Secondary | ICD-10-CM

## 2013-05-20 DIAGNOSIS — E785 Hyperlipidemia, unspecified: Secondary | ICD-10-CM | POA: Insufficient documentation

## 2013-05-20 DIAGNOSIS — I214 Non-ST elevation (NSTEMI) myocardial infarction: Secondary | ICD-10-CM

## 2013-05-20 DIAGNOSIS — I379 Nonrheumatic pulmonary valve disorder, unspecified: Secondary | ICD-10-CM | POA: Insufficient documentation

## 2013-05-20 DIAGNOSIS — I2589 Other forms of chronic ischemic heart disease: Secondary | ICD-10-CM

## 2013-05-20 NOTE — Progress Notes (Signed)
Echocardiogram performed.  

## 2013-05-21 DIAGNOSIS — C55 Malignant neoplasm of uterus, part unspecified: Secondary | ICD-10-CM | POA: Diagnosis not present

## 2013-05-21 DIAGNOSIS — I2510 Atherosclerotic heart disease of native coronary artery without angina pectoris: Secondary | ICD-10-CM | POA: Diagnosis not present

## 2013-05-21 DIAGNOSIS — E1129 Type 2 diabetes mellitus with other diabetic kidney complication: Secondary | ICD-10-CM | POA: Diagnosis not present

## 2013-05-21 DIAGNOSIS — E785 Hyperlipidemia, unspecified: Secondary | ICD-10-CM | POA: Diagnosis not present

## 2013-05-21 DIAGNOSIS — I10 Essential (primary) hypertension: Secondary | ICD-10-CM | POA: Diagnosis not present

## 2013-05-21 DIAGNOSIS — E039 Hypothyroidism, unspecified: Secondary | ICD-10-CM | POA: Diagnosis not present

## 2013-05-21 DIAGNOSIS — N058 Unspecified nephritic syndrome with other morphologic changes: Secondary | ICD-10-CM | POA: Diagnosis not present

## 2013-05-21 DIAGNOSIS — E049 Nontoxic goiter, unspecified: Secondary | ICD-10-CM | POA: Diagnosis not present

## 2013-05-21 DIAGNOSIS — Z23 Encounter for immunization: Secondary | ICD-10-CM | POA: Diagnosis not present

## 2013-05-21 DIAGNOSIS — Z1331 Encounter for screening for depression: Secondary | ICD-10-CM | POA: Diagnosis not present

## 2013-06-11 DIAGNOSIS — N183 Chronic kidney disease, stage 3 unspecified: Secondary | ICD-10-CM | POA: Diagnosis not present

## 2013-06-17 DIAGNOSIS — N179 Acute kidney failure, unspecified: Secondary | ICD-10-CM | POA: Diagnosis not present

## 2013-08-15 DIAGNOSIS — N183 Chronic kidney disease, stage 3 unspecified: Secondary | ICD-10-CM | POA: Diagnosis not present

## 2013-08-26 DIAGNOSIS — N183 Chronic kidney disease, stage 3 unspecified: Secondary | ICD-10-CM | POA: Diagnosis not present

## 2013-09-18 DIAGNOSIS — E1129 Type 2 diabetes mellitus with other diabetic kidney complication: Secondary | ICD-10-CM | POA: Diagnosis not present

## 2013-09-18 DIAGNOSIS — N058 Unspecified nephritic syndrome with other morphologic changes: Secondary | ICD-10-CM | POA: Diagnosis not present

## 2013-09-18 DIAGNOSIS — E785 Hyperlipidemia, unspecified: Secondary | ICD-10-CM | POA: Diagnosis not present

## 2013-09-18 DIAGNOSIS — C55 Malignant neoplasm of uterus, part unspecified: Secondary | ICD-10-CM | POA: Diagnosis not present

## 2013-09-18 DIAGNOSIS — E1142 Type 2 diabetes mellitus with diabetic polyneuropathy: Secondary | ICD-10-CM | POA: Diagnosis not present

## 2013-09-18 DIAGNOSIS — R5383 Other fatigue: Secondary | ICD-10-CM | POA: Diagnosis not present

## 2013-09-18 DIAGNOSIS — E039 Hypothyroidism, unspecified: Secondary | ICD-10-CM | POA: Diagnosis not present

## 2013-09-18 DIAGNOSIS — R7402 Elevation of levels of lactic acid dehydrogenase (LDH): Secondary | ICD-10-CM | POA: Diagnosis not present

## 2013-09-18 DIAGNOSIS — R5381 Other malaise: Secondary | ICD-10-CM | POA: Diagnosis not present

## 2013-09-18 DIAGNOSIS — R7401 Elevation of levels of liver transaminase levels: Secondary | ICD-10-CM | POA: Diagnosis not present

## 2013-09-18 DIAGNOSIS — I2510 Atherosclerotic heart disease of native coronary artery without angina pectoris: Secondary | ICD-10-CM | POA: Diagnosis not present

## 2013-09-18 DIAGNOSIS — Z79899 Other long term (current) drug therapy: Secondary | ICD-10-CM | POA: Diagnosis not present

## 2013-09-28 ENCOUNTER — Other Ambulatory Visit: Payer: Self-pay | Admitting: Cardiology

## 2013-10-14 DIAGNOSIS — IMO0001 Reserved for inherently not codable concepts without codable children: Secondary | ICD-10-CM | POA: Diagnosis not present

## 2013-10-14 DIAGNOSIS — Z961 Presence of intraocular lens: Secondary | ICD-10-CM | POA: Diagnosis not present

## 2013-10-14 DIAGNOSIS — H409 Unspecified glaucoma: Secondary | ICD-10-CM | POA: Diagnosis not present

## 2013-10-14 DIAGNOSIS — H4011X0 Primary open-angle glaucoma, stage unspecified: Secondary | ICD-10-CM | POA: Diagnosis not present

## 2013-10-31 ENCOUNTER — Other Ambulatory Visit: Payer: Self-pay | Admitting: Cardiology

## 2013-12-05 ENCOUNTER — Other Ambulatory Visit: Payer: Self-pay | Admitting: Cardiology

## 2013-12-05 DIAGNOSIS — N058 Unspecified nephritic syndrome with other morphologic changes: Secondary | ICD-10-CM | POA: Diagnosis not present

## 2013-12-05 DIAGNOSIS — E1129 Type 2 diabetes mellitus with other diabetic kidney complication: Secondary | ICD-10-CM | POA: Diagnosis not present

## 2013-12-05 DIAGNOSIS — Z6825 Body mass index (BMI) 25.0-25.9, adult: Secondary | ICD-10-CM | POA: Diagnosis not present

## 2013-12-05 DIAGNOSIS — I10 Essential (primary) hypertension: Secondary | ICD-10-CM | POA: Diagnosis not present

## 2014-01-09 DIAGNOSIS — I10 Essential (primary) hypertension: Secondary | ICD-10-CM | POA: Diagnosis not present

## 2014-01-09 DIAGNOSIS — E1129 Type 2 diabetes mellitus with other diabetic kidney complication: Secondary | ICD-10-CM | POA: Diagnosis not present

## 2014-01-09 DIAGNOSIS — N058 Unspecified nephritic syndrome with other morphologic changes: Secondary | ICD-10-CM | POA: Diagnosis not present

## 2014-01-14 DIAGNOSIS — H409 Unspecified glaucoma: Secondary | ICD-10-CM | POA: Diagnosis not present

## 2014-01-14 DIAGNOSIS — H4011X0 Primary open-angle glaucoma, stage unspecified: Secondary | ICD-10-CM | POA: Diagnosis not present

## 2014-01-15 DIAGNOSIS — I10 Essential (primary) hypertension: Secondary | ICD-10-CM | POA: Diagnosis not present

## 2014-01-15 DIAGNOSIS — E1129 Type 2 diabetes mellitus with other diabetic kidney complication: Secondary | ICD-10-CM | POA: Diagnosis not present

## 2014-01-15 DIAGNOSIS — E785 Hyperlipidemia, unspecified: Secondary | ICD-10-CM | POA: Diagnosis not present

## 2014-01-15 DIAGNOSIS — L659 Nonscarring hair loss, unspecified: Secondary | ICD-10-CM | POA: Diagnosis not present

## 2014-01-15 DIAGNOSIS — N058 Unspecified nephritic syndrome with other morphologic changes: Secondary | ICD-10-CM | POA: Diagnosis not present

## 2014-01-15 DIAGNOSIS — E1142 Type 2 diabetes mellitus with diabetic polyneuropathy: Secondary | ICD-10-CM | POA: Diagnosis not present

## 2014-01-15 DIAGNOSIS — I2510 Atherosclerotic heart disease of native coronary artery without angina pectoris: Secondary | ICD-10-CM | POA: Diagnosis not present

## 2014-01-15 DIAGNOSIS — E049 Nontoxic goiter, unspecified: Secondary | ICD-10-CM | POA: Diagnosis not present

## 2014-02-23 ENCOUNTER — Other Ambulatory Visit: Payer: Self-pay | Admitting: Cardiology

## 2014-03-04 ENCOUNTER — Ambulatory Visit: Payer: Medicare Other | Admitting: Cardiology

## 2014-03-06 DIAGNOSIS — IMO0002 Reserved for concepts with insufficient information to code with codable children: Secondary | ICD-10-CM | POA: Diagnosis not present

## 2014-03-06 DIAGNOSIS — N058 Unspecified nephritic syndrome with other morphologic changes: Secondary | ICD-10-CM | POA: Diagnosis not present

## 2014-03-06 DIAGNOSIS — E1129 Type 2 diabetes mellitus with other diabetic kidney complication: Secondary | ICD-10-CM | POA: Diagnosis not present

## 2014-03-06 DIAGNOSIS — I10 Essential (primary) hypertension: Secondary | ICD-10-CM | POA: Diagnosis not present

## 2014-03-27 ENCOUNTER — Other Ambulatory Visit: Payer: Self-pay

## 2014-03-27 MED ORDER — ATORVASTATIN CALCIUM 80 MG PO TABS: 80.0000 mg | ORAL_TABLET | Freq: Every day | ORAL | Status: DC

## 2014-03-27 MED ORDER — CLOPIDOGREL BISULFATE 75 MG PO TABS: 75.0000 mg | ORAL_TABLET | Freq: Every day | ORAL | Status: DC

## 2014-04-08 ENCOUNTER — Other Ambulatory Visit: Payer: Self-pay | Admitting: *Deleted

## 2014-04-08 MED ORDER — AMLODIPINE BESYLATE 5 MG PO TABS: ORAL_TABLET | ORAL | Status: DC

## 2014-04-18 DIAGNOSIS — Z961 Presence of intraocular lens: Secondary | ICD-10-CM | POA: Diagnosis not present

## 2014-04-18 DIAGNOSIS — E119 Type 2 diabetes mellitus without complications: Secondary | ICD-10-CM | POA: Diagnosis not present

## 2014-04-18 DIAGNOSIS — I10 Essential (primary) hypertension: Secondary | ICD-10-CM | POA: Diagnosis not present

## 2014-04-18 DIAGNOSIS — H409 Unspecified glaucoma: Secondary | ICD-10-CM | POA: Diagnosis not present

## 2014-04-23 ENCOUNTER — Encounter: Payer: Self-pay | Admitting: Cardiology

## 2014-04-23 ENCOUNTER — Ambulatory Visit (INDEPENDENT_AMBULATORY_CARE_PROVIDER_SITE_OTHER): Payer: Medicare Other | Admitting: Cardiology

## 2014-04-23 VITALS — BP 140/66 | HR 62 | Ht 64.0 in | Wt 137.9 lb

## 2014-04-23 DIAGNOSIS — I10 Essential (primary) hypertension: Secondary | ICD-10-CM | POA: Diagnosis not present

## 2014-04-23 DIAGNOSIS — E785 Hyperlipidemia, unspecified: Secondary | ICD-10-CM | POA: Diagnosis not present

## 2014-04-23 DIAGNOSIS — I2510 Atherosclerotic heart disease of native coronary artery without angina pectoris: Secondary | ICD-10-CM

## 2014-04-23 DIAGNOSIS — N183 Chronic kidney disease, stage 3 unspecified: Secondary | ICD-10-CM

## 2014-04-23 MED ORDER — PRAVASTATIN SODIUM 40 MG PO TABS: 40.0000 mg | ORAL_TABLET | Freq: Every evening | ORAL | Status: DC

## 2014-04-23 MED ORDER — AMLODIPINE BESYLATE 5 MG PO TABS: ORAL_TABLET | ORAL | Status: DC

## 2014-04-23 NOTE — Progress Notes (Signed)
Catherine Mason Date of Birth: 10/31/1927 Medical Record #[id number]  History of Present Illness: Catherine Mason is seen today for followup.  She has a history of coronary disease and is status post stenting of the right coronary in 2003 with overlapping bare-metal stents. This includes a 2.75 x 28 and 2.75 x 13 mm Zeta stents. In July she was admitted with a non-ST elevation myocardial infarction. Cardiac catheterization demonstrated 99% stenosis in the proximal LAD. The left circumflex and right coronaries were patent. Ejection fraction by echocardiogram was 35-40% with septal and apical akinesis. She underwent successful stenting of the proximal LAD with a drug-eluting stent on 01/14/2013. She is on antiplatelet therapy with aspirin and Plavix. Follow up Echo in November 2014 showed normal LV function.  On followup today she states she is doing well. She denies any symptoms of chest pain or shortness of breath.  She quit taking lisinopril in July. She recently ran out of amlodipine. She notes she bruises easily. She wants to stop lipitor. She states it makes it so her legs wont go. BP has been well controlled. She has lost 28 lbs and she states Dr. Forde Dandy is aware.   Current Outpatient Prescriptions on File Prior to Visit  Medication Sig Dispense Refill  . aspirin EC 81 MG tablet Take 81 mg by mouth daily.      . cholecalciferol (VITAMIN D) 1000 UNITS tablet Take 1,000 Units by mouth daily.      . clopidogrel (PLAVIX) 75 MG tablet Take 1 tablet (75 mg total) by mouth daily.  30 tablet  0  . levothyroxine (SYNTHROID, LEVOTHROID) 112 MCG tablet Take 112 mcg by mouth daily before breakfast.      . metoprolol succinate (TOPROL-XL) 50 MG 24 hr tablet Take 50 mg by mouth 2 (two) times daily. Take with or immediately following a meal.      . nitroGLYCERIN (NITROSTAT) 0.4 MG SL tablet Place 1 tablet (0.4 mg total) under the tongue every 5 (five) minutes as needed for chest pain.  25 tablet  3  . travoprost,  benzalkonium, (TRAVATAN) 0.004 % ophthalmic solution Place 1 drop into both eyes at bedtime.       No current facility-administered medications on file prior to visit.    Allergies  Allergen Reactions  . Shellfish Allergy Diarrhea    Past Medical History  Diagnosis Date  . Diabetes mellitus   . Thyroid disease   . Uterine cancer   . Ventral hernia   . CAD (coronary artery disease)     PCI 2003 2.75x13, 2.75x28 Zeta stents mid RCA  . HTN (hypertension)   . Hyperlipidemia   . SVT (supraventricular tachycardia)   . Diverticular disease   . Abdominal wall hernia at previous stoma site   . CKD (chronic kidney disease), stage III   . Hypothyroid   . Anemia   . NSTEMI (non-ST elevated myocardial infarction) 7/14    DES LAD    Past Surgical History  Procedure Laterality Date  . Abdominal hysterectomy    . Coronary stents    . Colon surgery      colectomy  . Colostomy reversal.      History  Smoking status  . Never Smoker   Smokeless tobacco  . Never Used    History  Alcohol Use No    Family History  Problem Relation Age of Onset  . Heart disease Mother     heart attack  . Cancer Father  prostate  . Diabetes Father     Review of Systems: As noted in history of present illness. All other systems were reviewed and are negative.  Physical Exam: BP 140/66  Pulse 62  Ht 5\' 4"  (1.626 m)  Wt 137 lb 14.4 oz (62.551 kg)  BMI 23.66 kg/m2 She is a pleasant, white female in no acute distress. Her HEENT exam is unremarkable.  Neck is supple without JVD, adenopathy, thyromegaly, or bruits. Lungs are clear. Cardiac exam reveals a regular rate and rhythm without gallop, murmur, or click. Abdomen is soft and nontender. She has no hepatosplenomegaly. She does have a large abdominal wall hernia in the left abdomen that her prior stoma site. It is easily reducible. Extremities are without edema. Pedal pulses are good. She is alert oriented x3. She has no focal neurologic  findings.  LABORATORY DATA: Lab Results  Component Value Date   WBC 7.7 01/15/2013   HGB 12.3 01/15/2013   HCT 36.6 01/15/2013   PLT 165 01/15/2013   GLUCOSE 254* 04/03/2013   CHOL 143 01/13/2013   TRIG 194* 01/13/2013   HDL 44 01/13/2013   LDLCALC 60 01/13/2013   ALT 30 01/13/2013   AST 116* 01/13/2013   NA 139 04/03/2013   K 4.8 04/03/2013   CL 106 04/03/2013   CREATININE 1.8* 04/03/2013   BUN 28* 04/03/2013   CO2 24 04/03/2013   TSH 1.974 01/13/2013   INR 1.08 01/13/2013   HGBA1C 7.9* 01/13/2013   Ecg: NSR with normal ECG. Rate 62 bpm.  Assessment / Plan: 1. Coronary disease with  NSTEMI in July of 2014 status post DES of the proximal LAD. Prior stents in the right coronary were patent. OK to stop Plavix at this point but continue ASA. Continue beta blocker therapy and statin therapy.  2. Ischemic cardiomyopathy with moderate left ventricular dysfunction. EF returned to normal post revascularization with stenting. She can stay off lisinopril at this point.   3. Dyslipidemia. Now on high-dose statin therapy with atorvastatin 80 mg daily. Complains of intolerance. She is willing to try a different statin so will switch her to pravastatin 40 mg daily.  4. Diabetes mellitus type 2. Per Dr. Forde Dandy.   5. Hypothyroidism.  6. HTN. Amlodipine renewed.

## 2014-04-23 NOTE — Patient Instructions (Signed)
Stop taking Plavix (clopidogrel)   You can stay off lisinopril  We will switch atorvastatin (lipitor) to pravastatin 40 mg daily for cholesterol.  I have refilled amlodipine.  I will see you in one year

## 2014-05-12 DIAGNOSIS — Z961 Presence of intraocular lens: Secondary | ICD-10-CM | POA: Diagnosis not present

## 2014-05-12 DIAGNOSIS — H4011X0 Primary open-angle glaucoma, stage unspecified: Secondary | ICD-10-CM | POA: Diagnosis not present

## 2014-05-27 DIAGNOSIS — Z23 Encounter for immunization: Secondary | ICD-10-CM | POA: Diagnosis not present

## 2014-05-27 DIAGNOSIS — C55 Malignant neoplasm of uterus, part unspecified: Secondary | ICD-10-CM | POA: Diagnosis not present

## 2014-05-27 DIAGNOSIS — E785 Hyperlipidemia, unspecified: Secondary | ICD-10-CM | POA: Diagnosis not present

## 2014-05-27 DIAGNOSIS — E1129 Type 2 diabetes mellitus with other diabetic kidney complication: Secondary | ICD-10-CM | POA: Diagnosis not present

## 2014-05-27 DIAGNOSIS — I10 Essential (primary) hypertension: Secondary | ICD-10-CM | POA: Diagnosis not present

## 2014-05-27 DIAGNOSIS — Z1389 Encounter for screening for other disorder: Secondary | ICD-10-CM | POA: Diagnosis not present

## 2014-05-27 DIAGNOSIS — N08 Glomerular disorders in diseases classified elsewhere: Secondary | ICD-10-CM | POA: Diagnosis not present

## 2014-05-27 DIAGNOSIS — I2510 Atherosclerotic heart disease of native coronary artery without angina pectoris: Secondary | ICD-10-CM | POA: Diagnosis not present

## 2014-05-27 DIAGNOSIS — H409 Unspecified glaucoma: Secondary | ICD-10-CM | POA: Diagnosis not present

## 2014-05-27 DIAGNOSIS — E039 Hypothyroidism, unspecified: Secondary | ICD-10-CM | POA: Diagnosis not present

## 2014-06-03 ENCOUNTER — Other Ambulatory Visit: Payer: Self-pay

## 2014-06-03 MED ORDER — CLOPIDOGREL BISULFATE 75 MG PO TABS: 75.0000 mg | ORAL_TABLET | Freq: Every day | ORAL | Status: DC

## 2014-06-12 ENCOUNTER — Encounter (HOSPITAL_COMMUNITY): Payer: Self-pay | Admitting: Cardiovascular Disease

## 2014-08-01 DIAGNOSIS — H4011X0 Primary open-angle glaucoma, stage unspecified: Secondary | ICD-10-CM | POA: Diagnosis not present

## 2014-08-01 DIAGNOSIS — Z961 Presence of intraocular lens: Secondary | ICD-10-CM | POA: Diagnosis not present

## 2014-09-10 DIAGNOSIS — Z1389 Encounter for screening for other disorder: Secondary | ICD-10-CM | POA: Diagnosis not present

## 2014-09-10 DIAGNOSIS — N08 Glomerular disorders in diseases classified elsewhere: Secondary | ICD-10-CM | POA: Diagnosis not present

## 2014-09-10 DIAGNOSIS — Z6824 Body mass index (BMI) 24.0-24.9, adult: Secondary | ICD-10-CM | POA: Diagnosis not present

## 2014-09-10 DIAGNOSIS — E785 Hyperlipidemia, unspecified: Secondary | ICD-10-CM | POA: Diagnosis not present

## 2014-09-10 DIAGNOSIS — E1129 Type 2 diabetes mellitus with other diabetic kidney complication: Secondary | ICD-10-CM | POA: Diagnosis not present

## 2014-09-10 DIAGNOSIS — I2510 Atherosclerotic heart disease of native coronary artery without angina pectoris: Secondary | ICD-10-CM | POA: Diagnosis not present

## 2014-09-10 DIAGNOSIS — H409 Unspecified glaucoma: Secondary | ICD-10-CM | POA: Diagnosis not present

## 2014-09-10 DIAGNOSIS — E039 Hypothyroidism, unspecified: Secondary | ICD-10-CM | POA: Diagnosis not present

## 2014-09-10 DIAGNOSIS — I10 Essential (primary) hypertension: Secondary | ICD-10-CM | POA: Diagnosis not present

## 2014-09-17 DIAGNOSIS — R634 Abnormal weight loss: Secondary | ICD-10-CM | POA: Diagnosis not present

## 2014-09-17 DIAGNOSIS — N183 Chronic kidney disease, stage 3 (moderate): Secondary | ICD-10-CM | POA: Diagnosis not present

## 2014-09-17 DIAGNOSIS — R600 Localized edema: Secondary | ICD-10-CM | POA: Diagnosis not present

## 2014-09-17 DIAGNOSIS — Z9889 Other specified postprocedural states: Secondary | ICD-10-CM | POA: Diagnosis not present

## 2014-12-03 DIAGNOSIS — E119 Type 2 diabetes mellitus without complications: Secondary | ICD-10-CM | POA: Diagnosis not present

## 2014-12-03 DIAGNOSIS — H4011X3 Primary open-angle glaucoma, severe stage: Secondary | ICD-10-CM | POA: Diagnosis not present

## 2014-12-23 DIAGNOSIS — R740 Nonspecific elevation of levels of transaminase and lactic acid dehydrogenase [LDH]: Secondary | ICD-10-CM | POA: Diagnosis not present

## 2014-12-23 DIAGNOSIS — E785 Hyperlipidemia, unspecified: Secondary | ICD-10-CM | POA: Diagnosis not present

## 2014-12-23 DIAGNOSIS — E1129 Type 2 diabetes mellitus with other diabetic kidney complication: Secondary | ICD-10-CM | POA: Diagnosis not present

## 2014-12-23 DIAGNOSIS — I10 Essential (primary) hypertension: Secondary | ICD-10-CM | POA: Diagnosis not present

## 2014-12-23 DIAGNOSIS — E039 Hypothyroidism, unspecified: Secondary | ICD-10-CM | POA: Diagnosis not present

## 2014-12-23 DIAGNOSIS — Z6823 Body mass index (BMI) 23.0-23.9, adult: Secondary | ICD-10-CM | POA: Diagnosis not present

## 2014-12-23 DIAGNOSIS — N08 Glomerular disorders in diseases classified elsewhere: Secondary | ICD-10-CM | POA: Diagnosis not present

## 2014-12-23 DIAGNOSIS — R0600 Dyspnea, unspecified: Secondary | ICD-10-CM | POA: Diagnosis not present

## 2014-12-23 DIAGNOSIS — C55 Malignant neoplasm of uterus, part unspecified: Secondary | ICD-10-CM | POA: Diagnosis not present

## 2014-12-23 DIAGNOSIS — E049 Nontoxic goiter, unspecified: Secondary | ICD-10-CM | POA: Diagnosis not present

## 2014-12-23 DIAGNOSIS — I2510 Atherosclerotic heart disease of native coronary artery without angina pectoris: Secondary | ICD-10-CM | POA: Diagnosis not present

## 2014-12-23 DIAGNOSIS — E1142 Type 2 diabetes mellitus with diabetic polyneuropathy: Secondary | ICD-10-CM | POA: Diagnosis not present

## 2015-01-13 ENCOUNTER — Encounter: Payer: Self-pay | Admitting: Cardiology

## 2015-01-13 ENCOUNTER — Ambulatory Visit (INDEPENDENT_AMBULATORY_CARE_PROVIDER_SITE_OTHER): Payer: Medicare Other | Admitting: Cardiology

## 2015-01-13 VITALS — BP 162/64 | HR 66 | Ht 64.0 in | Wt 140.0 lb

## 2015-01-13 DIAGNOSIS — I255 Ischemic cardiomyopathy: Secondary | ICD-10-CM

## 2015-01-13 DIAGNOSIS — I10 Essential (primary) hypertension: Secondary | ICD-10-CM

## 2015-01-13 DIAGNOSIS — I2510 Atherosclerotic heart disease of native coronary artery without angina pectoris: Secondary | ICD-10-CM | POA: Diagnosis not present

## 2015-01-13 DIAGNOSIS — N183 Chronic kidney disease, stage 3 unspecified: Secondary | ICD-10-CM

## 2015-01-13 DIAGNOSIS — I5033 Acute on chronic diastolic (congestive) heart failure: Secondary | ICD-10-CM

## 2015-01-13 MED ORDER — FUROSEMIDE 40 MG PO TABS
40.0000 mg | ORAL_TABLET | Freq: Every day | ORAL | Status: DC
Start: 2015-01-13 — End: 2015-08-24

## 2015-01-13 NOTE — Progress Notes (Signed)
Catherine Mason Date of Birth: Jun 13, 1928 Medical Record #[id number]  History of Present Illness: Catherine Mason is seen today for evaluation of dyspnea and swelling.  She has a history of coronary disease and is status post stenting of the right coronary in 2003 with overlapping bare-metal stents. This includes a 2.75 x 28 and 2.75 x 13 mm Zeta stents. In July 2014 she was admitted with a non-ST elevation myocardial infarction. Cardiac catheterization demonstrated 99% stenosis in the proximal LAD. The left circumflex and right coronaries were patent. Ejection fraction by echocardiogram was 35-40% with septal and apical akinesis. She underwent successful stenting of the proximal LAD with a drug-eluting stent on 01/14/2013. She is on antiplatelet therapy with aspirin and Plavix. Follow up Echo in November 2014 showed normal LV function.  She is seen with family today. She reports a 3 months history of increased leg swelling. Over the past month she has experienced spells of acute dyspnea when lying down at night. She has been sleeping on 3 pillows. No chest pain. She was seen by Dr. Forde Dandy and started on lasix 20 mg daily without much change.  She does have a history of CKD stage 3 with recent creatinine 1.5.   Current Outpatient Prescriptions on File Prior to Visit  Medication Sig Dispense Refill  . amLODipine (NORVASC) 5 MG tablet TAKE 1 TABLET BY MOUTH DAILY 90 tablet 3  . aspirin EC 81 MG tablet Take 81 mg by mouth daily.    . Insulin Detemir (LEVEMIR [place]) Inject 25 Units into the skin daily.    Marland Kitchen levothyroxine (SYNTHROID, LEVOTHROID) 112 MCG tablet Take 112 mcg by mouth daily before breakfast.    . linagliptin (TRADJENTA) 5 MG TABS tablet Take 5 mg by mouth daily.    . metoprolol succinate (TOPROL-XL) 50 MG 24 hr tablet Take 50 mg by mouth 2 (two) times daily. Take with or immediately following a meal.    . pravastatin (PRAVACHOL) 40 MG tablet Take 1 tablet (40 mg total) by mouth every evening. 90 tablet  3  . travoprost, benzalkonium, (TRAVATAN) 0.004 % ophthalmic solution Place 1 drop into both eyes at bedtime.     No current facility-administered medications on file prior to visit.    Allergies  Allergen Reactions  . Shellfish Allergy Diarrhea    Past Medical History  Diagnosis Date  . Diabetes mellitus   . Thyroid disease   . Uterine cancer   . Ventral hernia   . CAD (coronary artery disease)     PCI 2003 2.75x13, 2.75x28 Zeta stents mid RCA  . HTN (hypertension)   . Hyperlipidemia   . SVT (supraventricular tachycardia)   . Diverticular disease   . Abdominal wall hernia at previous stoma site   . CKD (chronic kidney disease), stage III   . Hypothyroid   . Anemia   . NSTEMI (non-ST elevated myocardial infarction) 7/14    DES LAD    Past Surgical History  Procedure Laterality Date  . Abdominal hysterectomy    . Coronary stents    . Colon surgery      colectomy  . Colostomy reversal.    . Left heart catheterization with coronary angiogram N/A 01/14/2013    Procedure: LEFT HEART CATHETERIZATION WITH CORONARY ANGIOGRAM;  Surgeon: Sherren Mocha, MD;  Location: [facility] CATH LAB;  Service: Cardiovascular;  Laterality: N/A;  . Percutaneous coronary stent intervention (pci-s)  01/14/2013    Procedure: PERCUTANEOUS CORONARY STENT INTERVENTION (PCI-S);  Surgeon: Sherren Mocha, MD;  Location:  [facility] CATH LAB;  Service: Cardiovascular;;    History  Smoking status  . Never Smoker   Smokeless tobacco  . Never Used    History  Alcohol Use No    Family History  Problem Relation Age of Onset  . Heart disease Mother     heart attack  . Cancer Father     prostate  . Diabetes Father     Review of Systems: As noted in history of present illness. All other systems were reviewed and are negative.  Physical Exam: BP 162/64 mmHg  Pulse 66  Ht 5\' 4"  (1.626 m)  Wt 63.504 kg (140 lb)  BMI 24.02 kg/m2 She is a pleasant, white female in no acute distress. Her HEENT exam is  unremarkable.  Neck is supple without JVD, adenopathy, thyromegaly, or bruits. Lungs are clear. Cardiac exam reveals a regular rate and rhythm without gallop, murmur, or click. Abdomen is soft and nontender. She has no hepatosplenomegaly. She does have a large abdominal wall hernia in the left abdomen that her prior stoma site. It is easily reducible. Extremities reveal 1+ ankle edema. Pedal pulses are good. She is alert oriented x3. She has no focal neurologic findings.  LABORATORY DATA: Lab Results  Component Value Date   WBC 7.7 01/15/2013   HGB 12.3 01/15/2013   HCT 36.6 01/15/2013   PLT 165 01/15/2013   GLUCOSE 254* 04/03/2013   CHOL 143 01/13/2013   TRIG 194* 01/13/2013   HDL 44 01/13/2013   LDLCALC 60 01/13/2013   ALT 30 01/13/2013   AST 116* 01/13/2013   NA 139 04/03/2013   K 4.8 04/03/2013   CL 106 04/03/2013   CREATININE 1.8* 04/03/2013   BUN 28* 04/03/2013   CO2 24 04/03/2013   TSH 1.974 01/13/2013   INR 1.08 01/13/2013   HGBA1C 7.9* 01/13/2013     Assessment / Plan: 1. Coronary disease with  NSTEMI in July of 2014 status post DES of the proximal LAD. Prior stents in the right coronary were patent. Continue ASA. Continue beta blocker therapy.  2. Ischemic cardiomyopathy with moderate left ventricular dysfunction. EF returned to normal post revascularization with stenting. Now with symptoms of acute on chronic diastolic CHF. Will increase lasix to 40 mg daily. Instructed on sodium restriction. Will update an Echocardiogram. Follow up in 3 weeks with BMET and BNP.   3. Dyslipidemia. History of intolerance to high dose lipitor.  Tolerating pravastatin 40 mg daily.  4. Diabetes mellitus type 2. Per Dr. Forde Dandy.   5. Hypothyroidism.  6. HTN.

## 2015-01-13 NOTE — Patient Instructions (Signed)
Increase lasix to 40 mg daily  We will schedule you for an Echocardiogram  I will have you return in 3 weeks with blood work

## 2015-01-14 ENCOUNTER — Other Ambulatory Visit: Payer: Self-pay

## 2015-01-14 ENCOUNTER — Telehealth: Payer: Self-pay | Admitting: Cardiology

## 2015-01-14 NOTE — Patient Outreach (Signed)
[facility]) Care Management  01/14/2015  Catherine Mason 05-21-1928 [id number]  Called and left message regarding [name]-states she is not aware of referral-declining services at this time. Request call back from nurse.  Plan: await return call.  Thea Silversmith, RN, MSN, [place] Coordinator Cell: [phone]

## 2015-01-14 NOTE — Telephone Encounter (Signed)
Catherine Mason is calling in reference to Catherine Mason ( received a referral from Dr. [place] for the program ) Patient is not receptive in participating in the program ..

## 2015-01-14 NOTE — Patient Outreach (Signed)
[facility]) Care Management  01/14/2015  DELAINA FETSCH Aug 15, 1927 [id number]   Received return call from Dr. Doug Sou nurse who states that Dr. Martinique did not make a referral to Care Management/she did not see that Dr. Martinique made a referral to [place] management and she did not make a referral. Care manager Informed her that member was not aware of referral and did not want to discuss past obtaining patient identifiers. Nurse informed that care manager informed Ms. Braid that she would call doctor to inform, member's response was ok. Dr. Doug Sou nurse aware that if Dr. Martinique wants care manager to try to engage at another time to call care manager back or re-refer.  Plan: close case.  Thea Silversmith, RN, MSN, [place] Coordinator Cell: [phone]

## 2015-01-14 NOTE — Patient Outreach (Signed)
[facility]) Care Management  01/14/2015  Catherine Mason 02-27-1928 [id number]   Referral from MD, assigned Thea Silversmith, RN to outreach.  [name]. [place], [place] Management [place] Assistant Phone: [phone] Fax: [phone]

## 2015-01-14 NOTE — Patient Outreach (Signed)
[place] [facility]) Care Management  01/14/2015  Catherine Mason 02-Feb-1928 [id number]  Call to discussed care management services with member and arrange home visit. [name] stated that she was not aware that physician had referred her. Member is hesitant to give any patient identifiers at this time and states she is waiting for a call from another doctor. Does not wish to proceed with conversation any further. Care Manager informed [name] that care manager would follow up with the referring doctor to notify.  Plan: call referring physician to inform.  Thea Silversmith, RN, MSN, [place] Coordinator Cell: [phone]

## 2015-01-15 NOTE — Telephone Encounter (Signed)
Dr.Jordan made aware. ?

## 2015-01-16 ENCOUNTER — Other Ambulatory Visit (HOSPITAL_COMMUNITY): Payer: Medicare Other

## 2015-01-19 ENCOUNTER — Ambulatory Visit (HOSPITAL_COMMUNITY)
Admission: RE | Admit: 2015-01-19 | Discharge: 2015-01-19 | Disposition: A | Discharge status: Home / Self Care | Payer: Medicare Other | Source: Ambulatory Visit | Attending: Cardiology | Admitting: Cardiology

## 2015-01-19 DIAGNOSIS — I255 Ischemic cardiomyopathy: Secondary | ICD-10-CM | POA: Diagnosis not present

## 2015-01-19 DIAGNOSIS — I5033 Acute on chronic diastolic (congestive) heart failure: Secondary | ICD-10-CM

## 2015-01-19 DIAGNOSIS — I358 Other nonrheumatic aortic valve disorders: Secondary | ICD-10-CM | POA: Insufficient documentation

## 2015-01-19 DIAGNOSIS — N183 Chronic kidney disease, stage 3 unspecified: Secondary | ICD-10-CM

## 2015-01-19 DIAGNOSIS — I059 Rheumatic mitral valve disease, unspecified: Secondary | ICD-10-CM | POA: Diagnosis not present

## 2015-01-19 DIAGNOSIS — I10 Essential (primary) hypertension: Secondary | ICD-10-CM | POA: Diagnosis not present

## 2015-01-19 DIAGNOSIS — I2510 Atherosclerotic heart disease of native coronary artery without angina pectoris: Secondary | ICD-10-CM | POA: Diagnosis not present

## 2015-01-19 NOTE — Progress Notes (Signed)
Patient ID: Catherine Mason, female   DOB: Jul 30, 1927, 81 y.o.   MRN: [id number]   2D Echocardiogram Complete.  01/19/2015   Deliah Boston, RDCS  Mrs. Insco was extremely uncomfortable during the exam, both on her left side and laying flat which caused her to move frequently.  There was also a fair amount of lung interference making this a technically difficult study.

## 2015-02-03 DIAGNOSIS — I5033 Acute on chronic diastolic (congestive) heart failure: Secondary | ICD-10-CM | POA: Diagnosis not present

## 2015-02-03 DIAGNOSIS — I2589 Other forms of chronic ischemic heart disease: Secondary | ICD-10-CM | POA: Diagnosis not present

## 2015-02-03 DIAGNOSIS — I255 Ischemic cardiomyopathy: Secondary | ICD-10-CM | POA: Diagnosis not present

## 2015-02-03 DIAGNOSIS — N183 Chronic kidney disease, stage 3 (moderate): Secondary | ICD-10-CM | POA: Diagnosis not present

## 2015-02-03 DIAGNOSIS — I10 Essential (primary) hypertension: Secondary | ICD-10-CM | POA: Diagnosis not present

## 2015-02-03 DIAGNOSIS — I2510 Atherosclerotic heart disease of native coronary artery without angina pectoris: Secondary | ICD-10-CM | POA: Diagnosis not present

## 2015-02-03 DIAGNOSIS — I509 Heart failure, unspecified: Secondary | ICD-10-CM | POA: Diagnosis not present

## 2015-02-04 ENCOUNTER — Other Ambulatory Visit: Payer: Self-pay

## 2015-02-04 LAB — BASIC METABOLIC PANEL
BUN: 28 mg/dL — ABNORMAL HIGH (ref 7–25)
CALCIUM: 9.5 mg/dL (ref 8.6–10.4)
CO2: 26 mmol/L (ref 20–31)
CREATININE: 1.43 mg/dL — AB (ref 0.60–0.88)
Chloride: 99 mmol/L (ref 98–110)
Glucose, Bld: 255 mg/dL — ABNORMAL HIGH (ref 65–99)
POTASSIUM: 4.6 mmol/L (ref 3.5–5.3)
SODIUM: 137 mmol/L (ref 135–146)

## 2015-02-04 LAB — BRAIN NATRIURETIC PEPTIDE: BRAIN NATRIURETIC PEPTIDE: 107.9 pg/mL — AB (ref 0.0–100.0)

## 2015-02-09 ENCOUNTER — Encounter: Payer: Self-pay | Admitting: Cardiology

## 2015-02-09 ENCOUNTER — Ambulatory Visit (INDEPENDENT_AMBULATORY_CARE_PROVIDER_SITE_OTHER): Payer: Medicare Other | Admitting: Cardiology

## 2015-02-09 VITALS — BP 148/65 | HR 60 | Ht 64.0 in | Wt 136.2 lb

## 2015-02-09 DIAGNOSIS — I2510 Atherosclerotic heart disease of native coronary artery without angina pectoris: Secondary | ICD-10-CM | POA: Diagnosis not present

## 2015-02-09 DIAGNOSIS — N183 Chronic kidney disease, stage 3 unspecified: Secondary | ICD-10-CM

## 2015-02-09 DIAGNOSIS — I5032 Chronic diastolic (congestive) heart failure: Secondary | ICD-10-CM | POA: Diagnosis not present

## 2015-02-09 DIAGNOSIS — I10 Essential (primary) hypertension: Secondary | ICD-10-CM | POA: Diagnosis not present

## 2015-02-09 DIAGNOSIS — I255 Ischemic cardiomyopathy: Secondary | ICD-10-CM

## 2015-02-09 NOTE — Progress Notes (Signed)
Catherine Mason Date of Birth: September 28, 1927 Medical Record #[id number]  History of Present Illness: Catherine Mason is seen today for follow up of dyspnea and swelling.  She has a history of coronary disease and is status post stenting of the right coronary in 2003 with overlapping bare-metal stents. This includes a 2.75 x 28 and 2.75 x 13 mm Zeta stents. In July 2014 she was admitted with a non-ST elevation myocardial infarction. Cardiac catheterization demonstrated 99% stenosis in the proximal LAD. The left circumflex and right coronaries were patent. Ejection fraction by echocardiogram was 35-40% with septal and apical akinesis. She underwent successful stenting of the proximal LAD with a drug-eluting stent on 01/14/2013. She is on antiplatelet therapy with aspirin and Plavix. Follow up Echo in November 2014 showed normal LV function.  She was seen last month with symptoms of increased swelling an orthopnea. Her lasix was increased to 40 mg daily. Echo showed normal LV and valve function. Mild diastolic dysfunction. Today she states swelling is better. No orthopnea now. No cough, chest pain, or dyspnea.   Current Outpatient Prescriptions on File Prior to Visit  Medication Sig Dispense Refill  . ALPHAGAN P 0.1 % SOLN Apply 1 drop to eye 2 (two) times daily before lunch and supper. Place 1 drop in each eye twice daily  3  . amLODipine (NORVASC) 5 MG tablet TAKE 1 TABLET BY MOUTH DAILY 90 tablet 3  . aspirin EC 81 MG tablet Take 81 mg by mouth daily.    . furosemide (LASIX) 40 MG tablet Take 1 tablet (40 mg total) by mouth daily. 30 tablet 6  . Insulin Glargine (TOUJEO SOLOSTAR) 300 UNIT/ML SOPN Inject 25 Units into the skin daily.    Marland Kitchen levothyroxine (SYNTHROID, LEVOTHROID) 112 MCG tablet Take 112 mcg by mouth daily before breakfast.    . linagliptin (TRADJENTA) 5 MG TABS tablet Take 5 mg by mouth daily.    . metoprolol succinate (TOPROL-XL) 50 MG 24 hr tablet Take 50 mg by mouth 2 (two) times daily. Take  with or immediately following a meal.    . NOVOFINE 32G X 6 MM MISC Use as directed  6  . ONE TOUCH ULTRA TEST test strip 1 strip by Other route daily. Use 1 strip to check glucose once daily  11  . pravastatin (PRAVACHOL) 40 MG tablet Take 1 tablet (40 mg total) by mouth every evening. 90 tablet 3  . travoprost, benzalkonium, (TRAVATAN) 0.004 % ophthalmic solution Place 1 drop into both eyes at bedtime.     No current facility-administered medications on file prior to visit.    Allergies  Allergen Reactions  . Shellfish Allergy Diarrhea    Past Medical History  Diagnosis Date  . Diabetes mellitus   . Thyroid disease   . Uterine cancer   . Ventral hernia   . CAD (coronary artery disease)     PCI 2003 2.75x13, 2.75x28 Zeta stents mid RCA  . HTN (hypertension)   . Hyperlipidemia   . SVT (supraventricular tachycardia)   . Diverticular disease   . Abdominal wall hernia at previous stoma site   . CKD (chronic kidney disease), stage III   . Hypothyroid   . Anemia   . NSTEMI (non-ST elevated myocardial infarction) 7/14    DES LAD    Past Surgical History  Procedure Laterality Date  . Abdominal hysterectomy    . Coronary stents    . Colon surgery      colectomy  . Colostomy reversal.    .  Left heart catheterization with coronary angiogram N/A 01/14/2013    Procedure: LEFT HEART CATHETERIZATION WITH CORONARY ANGIOGRAM;  Surgeon: Sherren Mocha, MD;  Location: [facility] CATH LAB;  Service: Cardiovascular;  Laterality: N/A;  . Percutaneous coronary stent intervention (pci-s)  01/14/2013    Procedure: PERCUTANEOUS CORONARY STENT INTERVENTION (PCI-S);  Surgeon: Sherren Mocha, MD;  Location: [facility] CATH LAB;  Service: Cardiovascular;;    History  Smoking status  . Never Smoker   Smokeless tobacco  . Never Used    History  Alcohol Use No    Family History  Problem Relation Age of Onset  . Heart disease Mother     heart attack  . Cancer Father     prostate  . Diabetes Father      Review of Systems: As noted in history of present illness. All other systems were reviewed and are negative.  Physical Exam: BP 148/65 mmHg  Pulse 60  Ht 5\' 4"  (1.626 m)  Wt 61.774 kg (136 lb 3 oz)  BMI 23.36 kg/m2 She is a pleasant, white female in no acute distress. Her HEENT exam is unremarkable.  Neck is supple without JVD, adenopathy, thyromegaly, or bruits. Lungs are clear. Cardiac exam reveals a regular rate and rhythm without gallop, murmur, or click. Abdomen is soft and nontender. She has no hepatosplenomegaly. She does have a large abdominal wall hernia in the left abdomen that her prior stoma site. It is easily reducible. Extremities reveal trace left ankle edema. Pedal pulses are good. She is alert oriented x3. She has no focal neurologic findings.  LABORATORY DATA: Lab Results  Component Value Date   WBC 7.7 01/15/2013   HGB 12.3 01/15/2013   HCT 36.6 01/15/2013   PLT 165 01/15/2013   GLUCOSE 255* 02/03/2015   CHOL 143 01/13/2013   TRIG 194* 01/13/2013   HDL 44 01/13/2013   LDLCALC 60 01/13/2013   ALT 30 01/13/2013   AST 116* 01/13/2013   NA 137 02/03/2015   K 4.6 02/03/2015   CL 99 02/03/2015   CREATININE 1.43* 02/03/2015   BUN 28* 02/03/2015   CO2 26 02/03/2015   TSH 1.974 01/13/2013   INR 1.08 01/13/2013   HGBA1C 7.9* 01/13/2013   BNP: 107.  Echo: 01/19/15:Study Conclusions  - Left ventricle: The cavity size was normal. Systolic function was normal. The estimated ejection fraction was in the range of 55% to 60%. Wall motion was normal; there were no regional wall motion abnormalities. There was an increased relative contribution of atrial contraction to ventricular filling. Doppler parameters are consistent with abnormal left ventricular relaxation (grade 1 diastolic dysfunction). - Aortic valve: Poorly visualized. Trileaflet; mildly thickened, mildly calcified leaflets. - Mitral valve: Mild focal calcification of the anterior  leaflet (medial segment(s)). - Pulmonary arteries: PA peak pressure: 32 mm Hg (S).  Assessment / Plan: 1. Coronary disease with  NSTEMI in July of 2014 status post DES of the proximal LAD. Prior stents in the right coronary were patent. Continue ASA. Continue beta blocker therapy.  2. Ischemic cardiomyopathy with moderate left ventricular dysfunction. EF has returned to normal post revascularization with stenting. Chronic diastolic CHF.  Will continue  lasix to 40 mg daily. Instructed on sodium restriction.   3. Dyslipidemia. History of intolerance to high dose lipitor.  Tolerating pravastatin 40 mg daily.  4. Diabetes mellitus type 2. Per Dr. Forde Dandy.   5. Hypothyroidism.  6. HTN.   I will follow up in 6 months.

## 2015-02-09 NOTE — Patient Instructions (Signed)
Continue your current therapy  I will see you in 6 months.   

## 2015-04-01 DIAGNOSIS — E1129 Type 2 diabetes mellitus with other diabetic kidney complication: Secondary | ICD-10-CM | POA: Diagnosis not present

## 2015-04-01 DIAGNOSIS — E1142 Type 2 diabetes mellitus with diabetic polyneuropathy: Secondary | ICD-10-CM | POA: Diagnosis not present

## 2015-04-01 DIAGNOSIS — N08 Glomerular disorders in diseases classified elsewhere: Secondary | ICD-10-CM | POA: Diagnosis not present

## 2015-04-01 DIAGNOSIS — E559 Vitamin D deficiency, unspecified: Secondary | ICD-10-CM | POA: Insufficient documentation

## 2015-04-01 DIAGNOSIS — R740 Nonspecific elevation of levels of transaminase and lactic acid dehydrogenase [LDH]: Secondary | ICD-10-CM | POA: Diagnosis not present

## 2015-04-01 DIAGNOSIS — E039 Hypothyroidism, unspecified: Secondary | ICD-10-CM | POA: Diagnosis not present

## 2015-04-01 DIAGNOSIS — E785 Hyperlipidemia, unspecified: Secondary | ICD-10-CM | POA: Diagnosis not present

## 2015-04-01 DIAGNOSIS — I10 Essential (primary) hypertension: Secondary | ICD-10-CM | POA: Diagnosis not present

## 2015-04-01 DIAGNOSIS — I2510 Atherosclerotic heart disease of native coronary artery without angina pectoris: Secondary | ICD-10-CM | POA: Diagnosis not present

## 2015-04-01 DIAGNOSIS — Z23 Encounter for immunization: Secondary | ICD-10-CM | POA: Diagnosis not present

## 2015-04-01 DIAGNOSIS — Z6823 Body mass index (BMI) 23.0-23.9, adult: Secondary | ICD-10-CM | POA: Diagnosis not present

## 2015-04-28 ENCOUNTER — Other Ambulatory Visit: Payer: Self-pay | Admitting: *Deleted

## 2015-04-28 DIAGNOSIS — I2510 Atherosclerotic heart disease of native coronary artery without angina pectoris: Secondary | ICD-10-CM

## 2015-04-28 MED ORDER — AMLODIPINE BESYLATE 5 MG PO TABS: ORAL_TABLET | ORAL | Status: DC

## 2015-05-05 ENCOUNTER — Other Ambulatory Visit: Payer: Self-pay

## 2015-05-05 DIAGNOSIS — I2510 Atherosclerotic heart disease of native coronary artery without angina pectoris: Secondary | ICD-10-CM

## 2015-05-05 MED ORDER — PRAVASTATIN SODIUM 40 MG PO TABS: 40.0000 mg | ORAL_TABLET | Freq: Every evening | ORAL | Status: DC

## 2015-08-24 ENCOUNTER — Other Ambulatory Visit: Payer: Self-pay | Admitting: Cardiology

## 2015-08-24 NOTE — Telephone Encounter (Signed)
Rx request sent to pharmacy.  

## 2015-10-17 ENCOUNTER — Other Ambulatory Visit: Payer: Self-pay | Admitting: Cardiology

## 2015-10-19 NOTE — Telephone Encounter (Signed)
Rx(s) sent to pharmacy electronically.  

## 2015-12-10 ENCOUNTER — Ambulatory Visit (INDEPENDENT_AMBULATORY_CARE_PROVIDER_SITE_OTHER): Payer: Medicare Other | Admitting: Cardiology

## 2015-12-10 ENCOUNTER — Encounter: Payer: Self-pay | Admitting: Cardiology

## 2015-12-10 VITALS — BP 136/58 | HR 60 | Ht 64.0 in | Wt 154.0 lb

## 2015-12-10 DIAGNOSIS — I5032 Chronic diastolic (congestive) heart failure: Secondary | ICD-10-CM

## 2015-12-10 DIAGNOSIS — I10 Essential (primary) hypertension: Secondary | ICD-10-CM | POA: Diagnosis not present

## 2015-12-10 DIAGNOSIS — E785 Hyperlipidemia, unspecified: Secondary | ICD-10-CM

## 2015-12-10 DIAGNOSIS — I2510 Atherosclerotic heart disease of native coronary artery without angina pectoris: Secondary | ICD-10-CM

## 2015-12-10 MED ORDER — FUROSEMIDE 40 MG PO TABS: 40.0000 mg | ORAL_TABLET | Freq: Every day | ORAL | Status: DC

## 2015-12-10 NOTE — Patient Instructions (Signed)
Continue your current therapy  Work on getting your weight down  I will see you in 6-8 months.

## 2015-12-10 NOTE — Progress Notes (Signed)
Catherine Mason Date of Birth: 1927-10-28 Medical Record [id number]  History of Present Illness: Catherine Mason is seen today for follow up of CAD.  She has a history of coronary disease and is status post stenting of the right coronary in 2003 with overlapping bare-metal stents. This includes a 2.75 x 28 and 2.75 x 13 mm Zeta stents. In July 2014 she was admitted with a non-ST elevation myocardial infarction. Cardiac catheterization demonstrated 99% stenosis in the proximal LAD. The left circumflex and right coronaries were patent. Ejection fraction by echocardiogram was 35-40% with septal and apical akinesis. She underwent successful stenting of the proximal LAD with a drug-eluting stent on 01/14/2013.  Follow up Echo in November 2014 and in July 2016 showed normal LV function.  On follow up today she states she is feeling well. No chest pain or SOB. Sometimes has swelling in her ankles. She notes her appetite is back and she has gained 18 lbs since her last visit. She does complain of insomnia.   Current Outpatient Prescriptions on File Prior to Visit  Medication Sig Dispense Refill  . ALPHAGAN P 0.1 % SOLN Apply 1 drop to eye 2 (two) times daily before lunch and supper. Place 1 drop in each eye twice daily  3  . amLODipine (NORVASC) 5 MG tablet TAKE 1 TABLET BY MOUTH DAILY 90 tablet 3  . aspirin EC 81 MG tablet Take 81 mg by mouth daily.    . Insulin Glargine (TOUJEO SOLOSTAR) 300 UNIT/ML SOPN Inject 25 Units into the skin daily.    Marland Kitchen levothyroxine (SYNTHROID, LEVOTHROID) 112 MCG tablet Take 112 mcg by mouth daily before breakfast.    . metoprolol succinate (TOPROL-XL) 50 MG 24 hr tablet Take 50 mg by mouth 2 (two) times daily. Take with or immediately following a meal.    . NOVOFINE 32G X 6 MM MISC Use as directed  6  . ONE TOUCH ULTRA TEST test strip 1 strip by Other route daily. Use 1 strip to check glucose once daily  11  . pravastatin (PRAVACHOL) 40 MG tablet Take 1 tablet (40 mg total) by  mouth every evening. 90 tablet 3  . travoprost, benzalkonium, (TRAVATAN) 0.004 % ophthalmic solution Place 1 drop into both eyes at bedtime.     No current facility-administered medications on file prior to visit.    Allergies  Allergen Reactions  . Shellfish Allergy Diarrhea    Past Medical History  Diagnosis Date  . Diabetes mellitus   . Thyroid disease   . Uterine cancer ([place])   . Ventral hernia   . CAD (coronary artery disease)     PCI 2003 2.75x13, 2.75x28 Zeta stents mid RCA  . HTN (hypertension)   . Hyperlipidemia   . SVT (supraventricular tachycardia) ([place])   . Diverticular disease   . Abdominal wall hernia at previous stoma site   . CKD (chronic kidney disease), stage III   . Hypothyroid   . Anemia   . NSTEMI (non-ST elevated myocardial infarction) ([place]) 7/14    DES LAD    Past Surgical History  Procedure Laterality Date  . Abdominal hysterectomy    . Coronary stents    . Colon surgery      colectomy  . Colostomy reversal.    . Left heart catheterization with coronary angiogram N/A 01/14/2013    Procedure: LEFT HEART CATHETERIZATION WITH CORONARY ANGIOGRAM;  Surgeon: Sherren Mocha, MD;  Location: [facility] CATH LAB;  Service: Cardiovascular;  Laterality: N/A;  . Percutaneous  coronary stent intervention (pci-s)  01/14/2013    Procedure: PERCUTANEOUS CORONARY STENT INTERVENTION (PCI-S);  Surgeon: Sherren Mocha, MD;  Location: [facility] CATH LAB;  Service: Cardiovascular;;    History  Smoking status  . Never Smoker   Smokeless tobacco  . Never Used    History  Alcohol Use No    Family History  Problem Relation Age of Onset  . Heart disease Mother     heart attack  . Cancer Father     prostate  . Diabetes Father     Review of Systems: As noted in history of present illness. All other systems were reviewed and are negative.  Physical Exam: BP 136/58 mmHg  Pulse 60  Ht 5\' 4"  (1.626 m)  Wt 154 lb (69.854 kg)  BMI 26.42 kg/m2 She is a pleasant, white female  in no acute distress. Her HEENT exam is unremarkable.  Neck is supple without JVD, adenopathy, thyromegaly, or bruits. Lungs are clear. Cardiac exam reveals a regular rate and rhythm without gallop, murmur, or click. Abdomen is soft and nontender. She has no hepatosplenomegaly. She does have a large abdominal wall hernia in the left abdomen that her prior stoma site. It is easily reducible. Extremities reveal trace left ankle edema. Pedal pulses are good. She is alert oriented x3. She has no focal neurologic findings. She is walking with a cane.  LABORATORY DATA: Lab Results  Component Value Date   WBC 7.7 01/15/2013   HGB 12.3 01/15/2013   HCT 36.6 01/15/2013   PLT 165 01/15/2013   GLUCOSE 255* 02/03/2015   CHOL 143 01/13/2013   TRIG 194* 01/13/2013   HDL 44 01/13/2013   LDLCALC 60 01/13/2013   ALT 30 01/13/2013   AST 116* 01/13/2013   NA 137 02/03/2015   K 4.6 02/03/2015   CL 99 02/03/2015   CREATININE 1.43* 02/03/2015   BUN 28* 02/03/2015   CO2 26 02/03/2015   TSH 1.974 01/13/2013   INR 1.08 01/13/2013   HGBA1C 7.9* 01/13/2013   Ecg today shows NSR with old anterior infarct. No acute change. I have personally reviewed and interpreted this study.   Echo: 01/19/15:Study Conclusions  - Left ventricle: The cavity size was normal. Systolic function was normal. The estimated ejection fraction was in the range of 55% to 60%. Wall motion was normal; there were no regional wall motion abnormalities. There was an increased relative contribution of atrial contraction to ventricular filling. Doppler parameters are consistent with abnormal left ventricular relaxation (grade 1 diastolic dysfunction). - Aortic valve: Poorly visualized. Trileaflet; mildly thickened, mildly calcified leaflets. - Mitral valve: Mild focal calcification of the anterior leaflet (medial segment(s)). - Pulmonary arteries: PA peak pressure: 32 mm Hg (S).  Assessment / Plan: 1. Coronary disease  with  NSTEMI in July of 2014 status post DES of the proximal LAD. Prior stents in the right coronary from 2003 were patent. Continue ASA. Continue beta blocker therapy.  2. Ischemic cardiomyopathy- EF has returned to normal post revascularization with stenting. Chronic diastolic CHF.  Will continue  lasix to 40 mg daily. Instructed on sodium restriction. She does not appear to be volume overloaded today.  3. Dyslipidemia. History of intolerance to high dose lipitor.  Tolerating pravastatin 40 mg daily. Labs followed by Dr. Forde Dandy.  4. Diabetes mellitus type 2. Per Dr. Forde Dandy.   5. Hypothyroidism.  6. HTN.   I will follow up in 6 months.

## 2015-12-16 ENCOUNTER — Encounter: Payer: Self-pay | Admitting: Cardiology

## 2016-04-15 ENCOUNTER — Other Ambulatory Visit: Payer: Self-pay | Admitting: Cardiology

## 2016-04-15 DIAGNOSIS — I2510 Atherosclerotic heart disease of native coronary artery without angina pectoris: Secondary | ICD-10-CM

## 2016-04-28 ENCOUNTER — Other Ambulatory Visit: Payer: Self-pay

## 2016-04-28 DIAGNOSIS — I2510 Atherosclerotic heart disease of native coronary artery without angina pectoris: Secondary | ICD-10-CM

## 2016-04-28 MED ORDER — PRAVASTATIN SODIUM 40 MG PO TABS: 40.0000 mg | ORAL_TABLET | Freq: Every evening | ORAL | 3 refills | Status: DC

## 2016-07-01 NOTE — Progress Notes (Signed)
Catherine Mason Date of Birth: [birth year] Medical Record [id number]  History of Present Illness: Catherine Mason is seen today for follow up of CAD.  She has a history of coronary disease and is status post stenting of the right coronary in 2003 with overlapping bare-metal stents. This includes a 2.75 x 28 and 2.75 x 13 mm Zeta stents. In July 2014 she was admitted with a non-ST elevation myocardial infarction. Cardiac catheterization demonstrated 99% stenosis in the proximal LAD. The left circumflex and right coronaries were patent. Ejection fraction by echocardiogram was 35-40% with septal and apical akinesis. She underwent successful stenting of the proximal LAD with a drug-eluting stent on 01/14/2013.  Follow up Echo in November 2014 and in July 2016 showed normal LV function.  On follow up today she states she is feeling well. No chest pain or SOB. No swelling in her ankles. She does have back pain when she stands very long. She also lost her daughter this year due to Alzheimer's disease.    Current Outpatient Prescriptions on File Prior to Visit  Medication Sig Dispense Refill  . ALPHAGAN P 0.1 % SOLN Apply 1 drop to eye 2 (two) times daily before lunch and supper. Place 1 drop in each eye twice daily  3  . amLODipine (NORVASC) 5 MG tablet TAKE 1 TABLET BY MOUTH DAILY 90 tablet 2  . aspirin EC 81 MG tablet Take 81 mg by mouth daily.    . furosemide (LASIX) 40 MG tablet Take 1 tablet (40 mg total) by mouth daily. 90 tablet 3  . Insulin Glargine (TOUJEO SOLOSTAR) 300 UNIT/ML SOPN Inject 25 Units into the skin daily.    Marland Kitchen levothyroxine (SYNTHROID, LEVOTHROID) 112 MCG tablet Take 112 mcg by mouth daily before breakfast.    . metoprolol succinate (TOPROL-XL) 50 MG 24 hr tablet Take 50 mg by mouth 2 (two) times daily. Take with or immediately following a meal.    . NOVOFINE 32G X 6 MM MISC Use as directed  6  . ONE TOUCH ULTRA TEST test strip 1 strip by Other route daily. Use 1 strip to check glucose  once daily  11  . pravastatin (PRAVACHOL) 40 MG tablet Take 1 tablet (40 mg total) by mouth every evening. 90 tablet 3  . travoprost, benzalkonium, (TRAVATAN) 0.004 % ophthalmic solution Place 1 drop into both eyes at bedtime.     No current facility-administered medications on file prior to visit.     Allergies  Allergen Reactions  . Shellfish Allergy Diarrhea    "SCALLOPS"    Past Medical History:  Diagnosis Date  . Abdominal wall hernia at previous stoma site   . Anemia   . CAD (coronary artery disease)    PCI 2003 2.75x13, 2.75x28 Zeta stents mid RCA  . CKD (chronic kidney disease), stage III   . Diabetes mellitus   . Diverticular disease   . HTN (hypertension)   . Hyperlipidemia   . Hypothyroid   . NSTEMI (non-ST elevated myocardial infarction) (Catherine Mason) 7/14   DES LAD  . SVT (supraventricular tachycardia) (Catherine Mason)   . Thyroid disease   . Uterine cancer (Catherine Mason)   . Ventral hernia     Past Surgical History:  Procedure Laterality Date  . ABDOMINAL HYSTERECTOMY    . COLON SURGERY     colectomy  . colostomy reversal.    . coronary stents    . LEFT HEART CATHETERIZATION WITH CORONARY ANGIOGRAM N/A 01/14/2013   Procedure: LEFT HEART CATHETERIZATION WITH CORONARY  Catherine Mason;  Surgeon: Catherine Mocha, MD;  Location: [facility] CATH LAB;  Service: Cardiovascular;  Laterality: N/A;  . PERCUTANEOUS CORONARY STENT INTERVENTION (PCI-S)  01/14/2013   Procedure: PERCUTANEOUS CORONARY STENT INTERVENTION (PCI-S);  Surgeon: Catherine Mocha, MD;  Location: [facility] CATH LAB;  Service: Cardiovascular;;    History  Smoking Status  . Never Smoker  Smokeless Tobacco  . Never Used    History  Alcohol Use No    Family History  Problem Relation Age of Onset  . Heart disease Mother     heart attack  . Cancer Father     prostate  . Diabetes Father     Review of Systems: As noted in history of present illness. All other systems were reviewed and are negative.  Physical Exam: BP 135/67 (BP  Location: Left Arm, Patient Position: Sitting, Cuff Size: Normal)   Pulse (!) 58   Ht 5\' 4"  (1.626 m)   Wt 157 lb 4 oz (71.3 kg)   BMI 26.99 kg/m  She is a pleasant, white female in no acute distress. Her HEENT exam is unremarkable.  Neck is supple without JVD, adenopathy, thyromegaly, or bruits. Lungs are clear. Cardiac exam reveals a regular rate and rhythm without gallop, murmur, or click. Abdomen is soft and nontender. She has no hepatosplenomegaly. She does have a large abdominal wall hernia in the left abdomen that her prior stoma site. It is easily reducible. Extremities reveal trace left ankle edema. Pedal pulses are good. She is alert oriented x3. She has no focal neurologic findings. She is walking with a cane.  LABORATORY DATA: Lab Results  Component Value Date   WBC 7.7 01/15/2013   HGB 12.3 01/15/2013   HCT 36.6 01/15/2013   PLT 165 01/15/2013   GLUCOSE 255 (H) 02/03/2015   CHOL 143 01/13/2013   TRIG 194 (H) 01/13/2013   HDL 44 01/13/2013   LDLCALC 60 01/13/2013   ALT 30 01/13/2013   AST 116 (H) 01/13/2013   NA 137 02/03/2015   K 4.6 02/03/2015   CL 99 02/03/2015   CREATININE 1.43 (H) 02/03/2015   BUN 28 (H) 02/03/2015   CO2 26 02/03/2015   TSH 1.974 01/13/2013   INR 1.08 01/13/2013   HGBA1C 7.9 (H) 01/13/2013    Labs dated 12/14/15: cholesterol 142, triglycerides 129, LDL 60, HDL 56. BUN 25, creatinine 1.6. CMET otherwise normal. TSH normal Dated 04/08/16: glucose 312. A1c 7.6%.   Echo: 01/19/15:Study Conclusions  - Left ventricle: The cavity size was normal. Systolic function was normal. The estimated ejection fraction was in the range of 55% to 60%. Wall motion was normal; there were no regional wall motion abnormalities. There was an increased relative contribution of atrial contraction to ventricular filling. Doppler parameters are consistent with abnormal left ventricular relaxation (grade 1 diastolic dysfunction). - Aortic valve: Poorly  visualized. Trileaflet; mildly thickened, mildly calcified leaflets. - Mitral valve: Mild focal calcification of the anterior leaflet (medial segment(s)). - Pulmonary arteries: PA peak pressure: 32 mm Hg (S).  Assessment / Plan: 1. Coronary disease with  NSTEMI in July of 2014 status post DES of the proximal LAD. Prior stents in the right coronary from 2003 were patent. Continue ASA. Continue beta blocker therapy.  2. Ischemic cardiomyopathy- EF has returned to normal post revascularization with stenting. Chronic diastolic CHF.  Will continue  lasix to 40 mg daily. Instructed on sodium restriction. She does not appear to be volume overloaded today.  3. Dyslipidemia. History of intolerance to high dose lipitor.  Tolerating pravastatin 40 mg daily. Labs followed by Dr. Forde Dandy.  4. Diabetes mellitus type 2. Per Dr. Forde Dandy.   5. Hypothyroidism.  6. HTN.   I will follow up in 6 months.

## 2016-07-06 ENCOUNTER — Encounter: Payer: Self-pay | Admitting: Cardiology

## 2016-07-06 ENCOUNTER — Ambulatory Visit (INDEPENDENT_AMBULATORY_CARE_PROVIDER_SITE_OTHER): Payer: Medicare Other | Admitting: Cardiology

## 2016-07-06 VITALS — BP 135/67 | HR 58 | Ht 64.0 in | Wt 157.2 lb

## 2016-07-06 DIAGNOSIS — N183 Chronic kidney disease, stage 3 unspecified: Secondary | ICD-10-CM

## 2016-07-06 DIAGNOSIS — I2510 Atherosclerotic heart disease of native coronary artery without angina pectoris: Secondary | ICD-10-CM

## 2016-07-06 DIAGNOSIS — I10 Essential (primary) hypertension: Secondary | ICD-10-CM

## 2016-07-06 DIAGNOSIS — I5032 Chronic diastolic (congestive) heart failure: Secondary | ICD-10-CM

## 2016-07-06 NOTE — Patient Instructions (Signed)
Continue your current therapy  I will see you in 6 months.   

## 2016-11-27 ENCOUNTER — Other Ambulatory Visit: Payer: Self-pay | Admitting: Cardiology

## 2017-01-06 ENCOUNTER — Other Ambulatory Visit: Payer: Self-pay | Admitting: Cardiology

## 2017-01-06 DIAGNOSIS — I2510 Atherosclerotic heart disease of native coronary artery without angina pectoris: Secondary | ICD-10-CM

## 2017-01-14 NOTE — Progress Notes (Signed)
Catherine Mason Date of Birth: [birth year] Medical Record #[id number]  History of Present Illness: Catherine Mason is seen today for follow up of CAD.  She has a history of coronary disease and is status post stenting of the right coronary in 2003 with overlapping bare-metal stents. This includes a 2.75 x 28 and 2.75 x 13 mm Zeta stents. In July 2014 she was admitted with a non-ST elevation myocardial infarction. Cardiac catheterization demonstrated 99% stenosis in the proximal LAD. The left circumflex and right coronaries were patent. Ejection fraction by echocardiogram was 35-40% with septal and apical akinesis. She underwent successful stenting of the proximal LAD with a drug-eluting stent on 01/14/2013.  Follow up Echo in November 2014 and in July 2016 showed normal LV function.  On follow up today she states she is feeling well. About 2 weeks ago she states she awoke at 2 am with short and fast breathing. No chest pain. Lasted about one hour and resolved. States this is the only time she has felt this. Otherwise no chest pain or SOB. No swelling in her ankles. Reports A1c improved from 8.6>>7.1%. Mouth does feel dry.   Current Outpatient Prescriptions on File Prior to Visit  Medication Sig Dispense Refill  . amLODipine (NORVASC) 5 MG tablet TAKE 1 TABLET BY MOUTH DAILY 90 tablet 0  . aspirin EC 81 MG tablet Take 81 mg by mouth daily.    . furosemide (LASIX) 40 MG tablet TAKE 1 TABLET DAILY 90 tablet 3  . Insulin Glargine (TOUJEO SOLOSTAR) 300 UNIT/ML SOPN Inject 21 Units into the skin daily.     Marland Kitchen levothyroxine (SYNTHROID, LEVOTHROID) 112 MCG tablet Take 112 mcg by mouth daily before breakfast.    . lisinopril (PRINIVIL,ZESTRIL) 5 MG tablet Take 1 tablet by mouth daily.    . metoprolol succinate (TOPROL-XL) 50 MG 24 hr tablet Take 50 mg by mouth 2 (two) times daily. Take with or immediately following a meal.    . NOVOFINE 32G X 6 MM MISC Use as directed  6  . ONE TOUCH ULTRA TEST test strip 1 strip  by Other route daily. Use 1 strip to check glucose once daily  11  . pravastatin (PRAVACHOL) 40 MG tablet Take 1 tablet (40 mg total) by mouth every evening. 90 tablet 3   No current facility-administered medications on file prior to visit.     Allergies  Allergen Reactions  . Shellfish Allergy Diarrhea    "SCALLOPS"    Past Medical History:  Diagnosis Date  . Abdominal wall hernia at previous stoma site   . Anemia   . CAD (coronary artery disease)    PCI 2003 2.75x13, 2.75x28 Zeta stents mid RCA  . CKD (chronic kidney disease), stage III   . Diabetes mellitus   . Diverticular disease   . HTN (hypertension)   . Hyperlipidemia   . Hypothyroid   . NSTEMI (non-ST elevated myocardial infarction) ([place]) 7/14   DES LAD  . SVT (supraventricular tachycardia) ([place])   . Thyroid disease   . Uterine cancer ([place])   . Ventral hernia     Past Surgical History:  Procedure Laterality Date  . ABDOMINAL HYSTERECTOMY    . COLON SURGERY     colectomy  . colostomy reversal.    . coronary stents    . LEFT HEART CATHETERIZATION WITH CORONARY ANGIOGRAM N/A 01/14/2013   Procedure: LEFT HEART CATHETERIZATION WITH CORONARY ANGIOGRAM;  Surgeon: Sherren Mocha, MD;  Location: [facility] CATH LAB;  Service: Cardiovascular;  Laterality: N/A;  . PERCUTANEOUS CORONARY STENT INTERVENTION (PCI-S)  01/14/2013   Procedure: PERCUTANEOUS CORONARY STENT INTERVENTION (PCI-S);  Surgeon: Sherren Mocha, MD;  Location: [facility] CATH LAB;  Service: Cardiovascular;;    History  Smoking Status  . Never Smoker  Smokeless Tobacco  . Never Used    History  Alcohol Use No    Family History  Problem Relation Age of Onset  . Heart disease Mother        heart attack  . Cancer Father        prostate  . Diabetes Father     Review of Systems: As noted in history of present illness. All other systems were reviewed and are negative.  Physical Exam: BP (!) 136/58   Pulse 60   Ht 5\' 4"  (1.626 m)   Wt 157 lb (71.2 kg)    BMI 26.95 kg/m  She is a pleasant, white female in no acute distress. Her HEENT exam is unremarkable.  Neck is supple without JVD, adenopathy, thyromegaly, or bruits. Lungs are clear. Cardiac exam reveals a regular rate and rhythm without gallop, murmur, or click. Abdomen is soft and nontender. She has no hepatosplenomegaly. She does have a large abdominal wall hernia in the left abdomen that her prior stoma site. It is easily reducible. Extremities reveal trace left ankle edema. Pedal pulses are good. She is alert oriented x3. She has no focal neurologic findings. She is walking with a cane.  LABORATORY DATA: Lab Results  Component Value Date   WBC 7.7 01/15/2013   HGB 12.3 01/15/2013   HCT 36.6 01/15/2013   PLT 165 01/15/2013   GLUCOSE 255 (H) 02/03/2015   CHOL 143 01/13/2013   TRIG 194 (H) 01/13/2013   HDL 44 01/13/2013   LDLCALC 60 01/13/2013   ALT 30 01/13/2013   AST 116 (H) 01/13/2013   NA 137 02/03/2015   K 4.6 02/03/2015   CL 99 02/03/2015   CREATININE 1.43 (H) 02/03/2015   BUN 28 (H) 02/03/2015   CO2 26 02/03/2015   TSH 1.974 01/13/2013   INR 1.08 01/13/2013   HGBA1C 7.9 (H) 01/13/2013    Labs dated 12/14/15: cholesterol 142, triglycerides 129, LDL 60, HDL 56. BUN 25, creatinine 1.6. CMET otherwise normal. TSH normal Dated 04/08/16: glucose 312. A1c 7.6%. Dated 12/28/16: cholesterol 143, triglycerides 131, HDL 57, LDL 60. A1c 7.1%. BUN 40, creatinine 1.7. Glucose 205. Otherwise CMET and TSH normal.   Ecg today shows NSR with a normal Ecg. I have personally reviewed and interpreted this study.   Echo: 01/19/15:Study Conclusions  - Left ventricle: The cavity size was normal. Systolic function was normal. The estimated ejection fraction was in the range of 55% to 60%. Wall motion was normal; there were no regional wall motion abnormalities. There was an increased relative contribution of atrial contraction to ventricular filling. Doppler parameters are  consistent with abnormal left ventricular relaxation (grade 1 diastolic dysfunction). - Aortic valve: Poorly visualized. Trileaflet; mildly thickened, mildly calcified leaflets. - Mitral valve: Mild focal calcification of the anterior leaflet (medial segment(s)). - Pulmonary arteries: PA peak pressure: 32 mm Hg (S).  Assessment / Plan: 1. Coronary disease with  NSTEMI in July of 2014 status post DES of the proximal LAD. Prior stents in the right coronary from 2003 were patent. Continue ASA. Continue beta blocker therapy. She is asymptomatic.   2. Ischemic cardiomyopathy- EF has returned to normal post revascularization with stenting. Chronic diastolic CHF.  appears well compensated on lasix  40  mg daily. Instructed on sodium restriction. Unclear what happened 2 weeks ago but unless she has recurrent symptoms will just monitor.   3. Dyslipidemia. History of intolerance to high dose lipitor.  Tolerating pravastatin 40 mg daily. Lipid levels look excellent.   4. Diabetes mellitus type 2. Improved - per Dr. Forde Dandy.   5. Hypothyroidism. Normal TSH  6. HTN.   I will follow up in 6 months.

## 2017-01-18 ENCOUNTER — Ambulatory Visit (INDEPENDENT_AMBULATORY_CARE_PROVIDER_SITE_OTHER): Payer: Medicare Other | Admitting: Cardiology

## 2017-01-18 ENCOUNTER — Encounter: Payer: Self-pay | Admitting: Cardiology

## 2017-01-18 VITALS — BP 136/58 | HR 60 | Ht 64.0 in | Wt 157.0 lb

## 2017-01-18 DIAGNOSIS — I2510 Atherosclerotic heart disease of native coronary artery without angina pectoris: Secondary | ICD-10-CM

## 2017-01-18 DIAGNOSIS — I5032 Chronic diastolic (congestive) heart failure: Secondary | ICD-10-CM | POA: Diagnosis not present

## 2017-01-18 DIAGNOSIS — N183 Chronic kidney disease, stage 3 unspecified: Secondary | ICD-10-CM

## 2017-01-18 DIAGNOSIS — I10 Essential (primary) hypertension: Secondary | ICD-10-CM

## 2017-01-18 NOTE — Patient Instructions (Addendum)
Continue your current therapy  I will see you in 6 months.   

## 2017-04-05 ENCOUNTER — Other Ambulatory Visit: Payer: Self-pay | Admitting: Cardiology

## 2017-04-05 DIAGNOSIS — I2510 Atherosclerotic heart disease of native coronary artery without angina pectoris: Secondary | ICD-10-CM

## 2017-05-04 ENCOUNTER — Other Ambulatory Visit: Payer: Self-pay | Admitting: Cardiology

## 2017-05-04 DIAGNOSIS — I2510 Atherosclerotic heart disease of native coronary artery without angina pectoris: Secondary | ICD-10-CM

## 2017-06-28 DIAGNOSIS — D649 Anemia, unspecified: Secondary | ICD-10-CM | POA: Insufficient documentation

## 2017-06-28 DIAGNOSIS — E079 Disorder of thyroid, unspecified: Secondary | ICD-10-CM | POA: Insufficient documentation

## 2017-06-28 DIAGNOSIS — K439 Ventral hernia without obstruction or gangrene: Secondary | ICD-10-CM | POA: Insufficient documentation

## 2017-06-28 DIAGNOSIS — C55 Malignant neoplasm of uterus, part unspecified: Secondary | ICD-10-CM | POA: Insufficient documentation

## 2017-06-28 DIAGNOSIS — K5790 Diverticulosis of intestine, part unspecified, without perforation or abscess without bleeding: Secondary | ICD-10-CM | POA: Insufficient documentation

## 2017-07-13 NOTE — Progress Notes (Signed)
Catherine Mason Date of Birth: 22-Oct-1927 Medical Record #[id number]  History of Present Illness: Catherine Mason is seen today for follow up of CAD.  She has a history of coronary disease and is status post stenting of the right coronary in 2003 with overlapping bare-metal stents. This includes a 2.75 x 28 and 2.75 x 13 mm Zeta stents. In July 2014 she was admitted with a non-ST elevation myocardial infarction. Cardiac catheterization demonstrated 99% stenosis in the proximal LAD. The left circumflex and right coronaries were patent. Ejection fraction by echocardiogram was 35-40% with septal and apical akinesis. She underwent successful stenting of the proximal LAD with a drug-eluting stent on 01/14/2013.  Follow up Echo in November 2014 and in July 2016 showed normal LV function.  On follow up today she states she is doing well. Feels more her age now. No longer drives and it is harder for her to get out. Tries to accomplish something everyday. Complains of insomnia. No chest pain or SOB. only swelling in her ankles in the evening. Notes occasional drop in blood sugar.   Current Outpatient Medications on File Prior to Visit  Medication Sig Dispense Refill  . amLODipine (NORVASC) 5 MG tablet TAKE 1 TABLET BY MOUTH EVERY DAY 90 tablet 3  . aspirin EC 81 MG tablet Take 81 mg by mouth daily.    . furosemide (LASIX) 40 MG tablet TAKE 1 TABLET DAILY 90 tablet 3  . Insulin Glargine (TOUJEO SOLOSTAR) 300 UNIT/ML SOPN Inject 20 Units into the skin daily.     Marland Kitchen levothyroxine (SYNTHROID, LEVOTHROID) 112 MCG tablet Take 112 mcg by mouth daily before breakfast.    . lisinopril (PRINIVIL,ZESTRIL) 5 MG tablet Take 1 tablet by mouth daily.    . metoprolol succinate (TOPROL-XL) 50 MG 24 hr tablet Take 50 mg by mouth 2 (two) times daily. Take with or immediately following a meal.    . NOVOFINE 32G X 6 MM MISC Use as directed  6  . ONE TOUCH ULTRA TEST test strip 1 strip by Other route daily. Use 1 strip to check glucose  once daily  11  . pravastatin (PRAVACHOL) 40 MG tablet TAKE 1 TABLET (40 MG TOTAL) BY MOUTH EVERY EVENING. 90 tablet 2  . Tafluprost (ZIOPTAN OP) Apply 1 drop to eye daily.    . timolol (TIMOPTIC) 0.25 % ophthalmic solution 1 drop 2 (two) times daily.    . Vitamin D, Ergocalciferol, (DRISDOL) 50000 units CAPS capsule Take 50,000 Units by mouth every 7 (seven) days.     No current facility-administered medications on file prior to visit.     Allergies  Allergen Reactions  . Shellfish Allergy Diarrhea    "SCALLOPS"    Past Medical History:  Diagnosis Date  . Abdominal wall hernia at previous stoma site   . Anemia   . CAD (coronary artery disease)    PCI 2003 2.75x13, 2.75x28 Zeta stents mid RCA  . CKD (chronic kidney disease), stage III ([place])   . Diabetes mellitus   . Diverticular disease   . HTN (hypertension)   . Hyperlipidemia   . Hypothyroid   . NSTEMI (non-ST elevated myocardial infarction) ([place]) 7/14   DES LAD  . SVT (supraventricular tachycardia) ([place])   . Thyroid disease   . Uterine cancer ([place])   . Ventral hernia     Past Surgical History:  Procedure Laterality Date  . ABDOMINAL HYSTERECTOMY    . COLON SURGERY     colectomy  . colostomy reversal.    .  coronary stents    . LEFT HEART CATHETERIZATION WITH CORONARY ANGIOGRAM N/A 01/14/2013   Procedure: LEFT HEART CATHETERIZATION WITH CORONARY ANGIOGRAM;  Surgeon: Sherren Mocha, MD;  Location[facility] CATH LAB;  Service: Cardiovascular;  Laterality: N/A;  . PERCUTANEOUS CORONARY STENT INTERVENTION (PCI-S)  01/14/2013   Procedure: PERCUTANEOUS CORONARY STENT INTERVENTION (PCI-S);  Surgeon: Sherren Mocha, MD;  Location: [facility] CATH LAB;  Service: Cardiovascular;;    Social History   Tobacco Use  Smoking Status Never Smoker  Smokeless Tobacco Never Used    Social History   Substance and Sexual Activity  Alcohol Use No    Family History  Problem Relation Age of Onset  . Heart disease Mother        heart attack   . Cancer Father        prostate  . Diabetes Father     Review of Systems: As noted in history of present illness. All other systems were reviewed and are negative.  Physical Exam: BP (!) 142/67   Pulse 61   Ht 5\' 4"  (1.626 m)   Wt 162 lb 6.4 oz (73.7 kg)   SpO2 98%   BMI 27.88 kg/m  GENERAL:  Well appearing, elderly WF in NAD. Walks with a cane. HEENT:  PERRL, EOMI, sclera are clear. Oropharynx is clear. NECK:  No jugular venous distention, carotid upstroke brisk and symmetric, no bruits, no thyromegaly or adenopathy LUNGS:  Clear to auscultation bilaterally CHEST:  Unremarkable HEART:  RRR,  PMI not displaced or sustained,S1 and S2 within normal limits, no S3, no S4: no clicks, no rubs, no murmurs ABD:  Soft, nontender. BS +, no masses or bruits. No hepatomegaly, no splenomegaly EXT:  2 + pulses throughout, no edema, no cyanosis no clubbing SKIN:  Warm and dry.  No rashes NEURO:  Alert and oriented x 3. Cranial nerves II through XII intact. PSYCH:  Cognitively intact    LABORATORY DATA: Lab Results  Component Value Date   WBC 7.7 01/15/2013   HGB 12.3 01/15/2013   HCT 36.6 01/15/2013   PLT 165 01/15/2013   GLUCOSE 255 (H) 02/03/2015   CHOL 143 01/13/2013   TRIG 194 (H) 01/13/2013   HDL 44 01/13/2013   LDLCALC 60 01/13/2013   ALT 30 01/13/2013   AST 116 (H) 01/13/2013   NA 137 02/03/2015   K 4.6 02/03/2015   CL 99 02/03/2015   CREATININE 1.43 (H) 02/03/2015   BUN 28 (H) 02/03/2015   CO2 26 02/03/2015   TSH 1.974 01/13/2013   INR 1.08 01/13/2013   HGBA1C 7.9 (H) 01/13/2013    Labs dated 12/14/15: cholesterol 142, triglycerides 129, LDL 60, HDL 56. BUN 25, creatinine 1.6. CMET otherwise normal. TSH normal Dated 04/08/16: glucose 312. A1c 7.6%. Dated 12/28/16: cholesterol 143, triglycerides 131, HDL 57, LDL 60. A1c 7.1%. BUN 40, creatinine 1.7. Glucose 205. Otherwise CMET and TSH normal.  Dated 05/02/17: A1c 7.5%   Echo: 01/19/15:Study Conclusions  - Left  ventricle: The cavity size was normal. Systolic function was normal. The estimated ejection fraction was in the range of 55% to 60%. Wall motion was normal; there were no regional wall motion abnormalities. There was an increased relative contribution of atrial contraction to ventricular filling. Doppler parameters are consistent with abnormal left ventricular relaxation (grade 1 diastolic dysfunction). - Aortic valve: Poorly visualized. Trileaflet; mildly thickened, mildly calcified leaflets. - Mitral valve: Mild focal calcification of the anterior leaflet (medial segment(s)). - Pulmonary arteries: PA peak pressure: 32 mm Hg (S).  Assessment / Plan: 1. Coronary disease with  NSTEMI in July of 2014 status post DES of the proximal LAD. Prior stents in the right coronary from 2003 were patent. Continue ASA. Continue beta blocker therapy. She is asymptomatic.   2. Ischemic cardiomyopathy- EF  returned to normal post revascularization with stenting. Chronic diastolic CHF.  appears well compensated on lasix  40 mg daily. Instructed on sodium restriction- she admits she doesn't do well with this.    3. Dyslipidemia. History of intolerance to high dose lipitor.  Tolerating pravastatin 40 mg daily. Lipid levels look excellent.   4. Diabetes mellitus type 2. per Dr. Forde Dandy.   5. Hypothyroidism. Normal TSH  6. HTN.   I will follow up in one year

## 2017-07-18 ENCOUNTER — Encounter: Payer: Self-pay | Admitting: Cardiology

## 2017-07-18 ENCOUNTER — Ambulatory Visit: Payer: Medicare Other | Admitting: Cardiology

## 2017-07-18 VITALS — BP 142/67 | HR 61 | Ht 64.0 in | Wt 162.4 lb

## 2017-07-18 DIAGNOSIS — I10 Essential (primary) hypertension: Secondary | ICD-10-CM

## 2017-07-18 DIAGNOSIS — I2510 Atherosclerotic heart disease of native coronary artery without angina pectoris: Secondary | ICD-10-CM | POA: Diagnosis not present

## 2017-07-18 DIAGNOSIS — I5032 Chronic diastolic (congestive) heart failure: Secondary | ICD-10-CM | POA: Diagnosis not present

## 2017-07-18 NOTE — Patient Instructions (Addendum)
Continue your current therapy  I will see you one year.   

## 2017-09-26 DIAGNOSIS — Z794 Long term (current) use of insulin: Secondary | ICD-10-CM | POA: Insufficient documentation

## 2017-11-29 ENCOUNTER — Other Ambulatory Visit: Payer: Self-pay | Admitting: Cardiology

## 2017-11-29 NOTE — Telephone Encounter (Signed)
Rx sent to pharmacy   

## 2018-01-28 ENCOUNTER — Other Ambulatory Visit: Payer: Self-pay | Admitting: Cardiology

## 2018-01-28 DIAGNOSIS — I2510 Atherosclerotic heart disease of native coronary artery without angina pectoris: Secondary | ICD-10-CM

## 2018-03-28 ENCOUNTER — Other Ambulatory Visit: Payer: Self-pay | Admitting: Cardiology

## 2018-03-28 DIAGNOSIS — I2510 Atherosclerotic heart disease of native coronary artery without angina pectoris: Secondary | ICD-10-CM

## 2018-06-08 DIAGNOSIS — E1142 Type 2 diabetes mellitus with diabetic polyneuropathy: Secondary | ICD-10-CM | POA: Insufficient documentation

## 2018-06-08 DIAGNOSIS — H409 Unspecified glaucoma: Secondary | ICD-10-CM | POA: Insufficient documentation

## 2018-06-08 DIAGNOSIS — H3530 Unspecified macular degeneration: Secondary | ICD-10-CM | POA: Insufficient documentation

## 2018-07-13 ENCOUNTER — Encounter: Payer: Self-pay | Admitting: Cardiology

## 2018-07-29 NOTE — Progress Notes (Signed)
Catherine Mason Date of Birth: Feb 11, 1928 Medical Record #[id number]  History of Present Illness: Catherine Mason is seen today for follow up of CAD.  She has a history of coronary disease and is status post stenting of the right coronary in 2003 with overlapping bare-metal stents. This includes a 2.75 x 28 and 2.75 x 13 mm Zeta stents. In July 2014 she was admitted with a non-ST elevation myocardial infarction. Cardiac catheterization demonstrated 99% stenosis in the proximal LAD. The left circumflex and right coronaries were patent. Ejection fraction by echocardiogram was 35-40% with septal and apical akinesis. She underwent successful stenting of the proximal LAD with a drug-eluting stent on 01/14/2013.  Follow up Echo in November 2014 and in July 2016 showed normal LV function.  On follow up today she states she is doing OK.  Feels more her age now. She is no longer driving. Is starting to use a walker some at home.  She denies any chest pain or SOB. No increase in edema. No palpitations.    Current Outpatient Medications on File Prior to Visit  Medication Sig Dispense Refill  . amLODipine (NORVASC) 5 MG tablet TAKE 1 TABLET BY MOUTH EVERY DAY 90 tablet 3  . aspirin EC 81 MG tablet Take 81 mg by mouth daily.    . furosemide (LASIX) 40 MG tablet TAKE 1 TABLET EVERY DAY 90 tablet 3  . Insulin Glargine (TOUJEO SOLOSTAR) 300 UNIT/ML SOPN Inject 20 Units into the skin daily.     Marland Kitchen levothyroxine (SYNTHROID, LEVOTHROID) 100 MCG tablet Take 100 mcg by mouth daily before breakfast.     . lisinopril (PRINIVIL,ZESTRIL) 5 MG tablet Take 1 tablet by mouth daily.    Marland Kitchen LUMIGAN 0.01 % SOLN Place 1 drop into both eyes as directed.    . metoprolol succinate (TOPROL-XL) 50 MG 24 hr tablet Take 50 mg by mouth 2 (two) times daily. Take with or immediately following a meal.    . NOVOFINE 32G X 6 MM MISC Use as directed  6  . ONE TOUCH ULTRA TEST test strip 1 strip by Other route daily. Use 1 strip to check glucose once  daily  11  . pravastatin (PRAVACHOL) 40 MG tablet TAKE 1 TABLET BY MOUTH EVERY DAY IN THE EVENING 90 tablet 3  . Tafluprost (ZIOPTAN OP) Apply 1 drop to eye daily.    . timolol (TIMOPTIC) 0.25 % ophthalmic solution 1 drop 2 (two) times daily.    . Vitamin D, Ergocalciferol, (DRISDOL) 50000 units CAPS capsule Take 50,000 Units by mouth every 7 (seven) days.     No current facility-administered medications on file prior to visit.     Allergies  Allergen Reactions  . Shellfish Allergy Diarrhea    "SCALLOPS"    Past Medical History:  Diagnosis Date  . Abdominal wall hernia at previous stoma site   . Anemia   . CAD (coronary artery disease)    PCI 2003 2.75x13, 2.75x28 Zeta stents mid RCA  . CKD (chronic kidney disease), stage III ([place])   . Diabetes mellitus   . Diverticular disease   . HTN (hypertension)   . Hyperlipidemia   . Hypothyroid   . NSTEMI (non-ST elevated myocardial infarction) ([place]) 7/14   DES LAD  . SVT (supraventricular tachycardia) ([place])   . Thyroid disease   . Uterine cancer ([place])   . Ventral hernia     Past Surgical History:  Procedure Laterality Date  . ABDOMINAL HYSTERECTOMY    . COLON SURGERY  colectomy  . colostomy reversal.    . coronary stents    . LEFT HEART CATHETERIZATION WITH CORONARY ANGIOGRAM N/A 01/14/2013   Procedure: LEFT HEART CATHETERIZATION WITH CORONARY ANGIOGRAM;  Surgeon: Sherren Mocha, MD;  Location: [facility] CATH LAB;  Service: Cardiovascular;  Laterality: N/A;  . PERCUTANEOUS CORONARY STENT INTERVENTION (PCI-S)  01/14/2013   Procedure: PERCUTANEOUS CORONARY STENT INTERVENTION (PCI-S);  Surgeon: Sherren Mocha, MD;  Location: [facility] CATH LAB;  Service: Cardiovascular;;    Social History   Tobacco Use  Smoking Status Never Smoker  Smokeless Tobacco Never Used    Social History   Substance and Sexual Activity  Alcohol Use No    Family History  Problem Relation Age of Onset  . Heart disease Mother        heart attack  . Cancer  Father        prostate  . Diabetes Father     Review of Systems: As noted in history of present illness. All other systems were reviewed and are negative.  Physical Exam: BP (!) 120/54   Pulse 61   Ht 5\' 4"  (1.626 m)   Wt 163 lb 12.8 oz (74.3 kg)   BMI 28.12 kg/m  GENERAL:  Well appearing WF in NAD HEENT:  PERRL, EOMI, sclera are clear. Oropharynx is clear. NECK:  No jugular venous distention, carotid upstroke brisk and symmetric, no bruits, no thyromegaly or adenopathy LUNGS:  Clear to auscultation bilaterally CHEST:  Unremarkable HEART:  RRR,  PMI not displaced or sustained,S1 and S2 within normal limits, no S3, no S4: no clicks, no rubs, no murmurs ABD:  Soft, nontender. BS +, no masses or bruits. No hepatomegaly, no splenomegaly EXT:  2 + pulses throughout, no edema, no cyanosis no clubbing SKIN:  Warm and dry.  No rashes NEURO:  Alert and oriented x 3. Cranial nerves II through XII intact. PSYCH:  Cognitively intact      LABORATORY DATA: Lab Results  Component Value Date   WBC 7.7 01/15/2013   HGB 12.3 01/15/2013   HCT 36.6 01/15/2013   PLT 165 01/15/2013   GLUCOSE 255 (H) 02/03/2015   CHOL 143 01/13/2013   TRIG 194 (H) 01/13/2013   HDL 44 01/13/2013   LDLCALC 60 01/13/2013   ALT 30 01/13/2013   AST 116 (H) 01/13/2013   NA 137 02/03/2015   K 4.6 02/03/2015   CL 99 02/03/2015   CREATININE 1.43 (H) 02/03/2015   BUN 28 (H) 02/03/2015   CO2 26 02/03/2015   TSH 1.974 01/13/2013   INR 1.08 01/13/2013   HGBA1C 7.9 (H) 01/13/2013    Labs dated 12/14/15: cholesterol 142, triglycerides 129, LDL 60, HDL 56. BUN 25, creatinine 1.6. CMET otherwise normal. TSH normal Dated 04/08/16: glucose 312. A1c 7.6%. Dated 12/28/16: cholesterol 143, triglycerides 131, HDL 57, LDL 60. A1c 7.1%. BUN 40, creatinine 1.7. Glucose 205. Otherwise CMET and TSH normal.  Dated 05/02/17: A1c 7.5% Dated 01/23/18: cholesterol 144, triglycerides 106, HDL 59, LDL 64. Hgb 12.9. creatinine 1.7. TSH  and ALT normal Dated 06/08/18: A1c 8.2%  Ecg today shows NSR with normal Ecg. I have personally reviewed and interpreted this study.  Echo: 01/19/15:Study Conclusions  - Left ventricle: The cavity size was normal. Systolic function was normal. The estimated ejection fraction was in the range of 55% to 60%. Wall motion was normal; there were no regional wall motion abnormalities. There was an increased relative contribution of atrial contraction to ventricular filling. Doppler parameters are consistent with abnormal left  ventricular relaxation (grade 1 diastolic dysfunction). - Aortic valve: Poorly visualized. Trileaflet; mildly thickened, mildly calcified leaflets. - Mitral valve: Mild focal calcification of the anterior leaflet (medial segment(s)). - Pulmonary arteries: PA peak pressure: 32 mm Hg (S).  Assessment / Plan: 1. Coronary disease with  NSTEMI in July of 2014 status post DES of the proximal LAD. Prior stents in the right coronary from 2003 were patent. Continue ASA.  She is asymptomatic.   2. Ischemic cardiomyopathy- EF  returned to normal post revascularization with stenting. Chronic diastolic CHF.  Doing well on lasix 40 mg daily. Continue sodium restriction.  3. Dyslipidemia. History of intolerance to high dose lipitor.  Tolerating pravastatin 40 mg daily. Lipid levels looked excellent in July  4. Diabetes mellitus type 2. per Dr. Forde Dandy.   5. Hypothyroidism. Normal TSH  6. HTN.   I will follow up in one year

## 2018-08-03 ENCOUNTER — Encounter: Payer: Self-pay | Admitting: Cardiology

## 2018-08-03 ENCOUNTER — Ambulatory Visit: Payer: Medicare Other | Admitting: Cardiology

## 2018-08-03 VITALS — BP 120/54 | HR 61 | Ht 64.0 in | Wt 163.8 lb

## 2018-08-03 DIAGNOSIS — I10 Essential (primary) hypertension: Secondary | ICD-10-CM | POA: Diagnosis not present

## 2018-08-03 DIAGNOSIS — I5032 Chronic diastolic (congestive) heart failure: Secondary | ICD-10-CM

## 2018-08-03 DIAGNOSIS — N183 Chronic kidney disease, stage 3 unspecified: Secondary | ICD-10-CM

## 2018-08-03 DIAGNOSIS — I2510 Atherosclerotic heart disease of native coronary artery without angina pectoris: Secondary | ICD-10-CM | POA: Diagnosis not present

## 2018-11-28 ENCOUNTER — Other Ambulatory Visit: Payer: Self-pay

## 2018-11-28 MED ORDER — FUROSEMIDE 40 MG PO TABS: 40.0000 mg | ORAL_TABLET | Freq: Every day | ORAL | 3 refills | Status: DC

## 2019-03-27 ENCOUNTER — Other Ambulatory Visit: Payer: Self-pay

## 2019-03-27 DIAGNOSIS — I2510 Atherosclerotic heart disease of native coronary artery without angina pectoris: Secondary | ICD-10-CM

## 2019-03-27 MED ORDER — AMLODIPINE BESYLATE 5 MG PO TABS: 5.0000 mg | ORAL_TABLET | Freq: Every day | ORAL | 1 refills | Status: DC

## 2019-07-30 NOTE — Progress Notes (Deleted)
Catherine Mason Date of Birth: Sep 19, 1927 Medical Record [id number]  History of Present Illness: Catherine Mason is seen today for follow up of CAD.  She has a history of coronary disease and is status post stenting of the right coronary in 2003 with overlapping bare-metal stents. This includes a 2.75 x 28 and 2.75 x 13 mm Zeta stents. In July 2014 she was admitted with a non-ST elevation myocardial infarction. Cardiac catheterization demonstrated 99% stenosis in the proximal LAD. The left circumflex and right coronaries were patent. Ejection fraction by echocardiogram was 35-40% with septal and apical akinesis. She underwent successful stenting of the proximal LAD with a drug-eluting stent on 01/14/2013.  Follow up Echo in November 2014 and in July 2016 showed normal LV function.  On follow up today she states she is doing OK.  Feels more her age now. She is no longer driving. Is starting to use a walker some at home.  She denies any chest pain or SOB. No increase in edema. No palpitations.    Current Outpatient Medications on File Prior to Visit  Medication Sig Dispense Refill  . amLODipine (NORVASC) 5 MG tablet Take 1 tablet (5 mg total) by mouth daily. 90 tablet 1  . aspirin EC 81 MG tablet Take 81 mg by mouth daily.    . furosemide (LASIX) 40 MG tablet Take 1 tablet (40 mg total) by mouth daily. 90 tablet 3  . Insulin Glargine (TOUJEO SOLOSTAR) 300 UNIT/ML SOPN Inject 20 Units into the skin daily.     Marland Kitchen levothyroxine (SYNTHROID, LEVOTHROID) 100 MCG tablet Take 100 mcg by mouth daily before breakfast.     . lisinopril (PRINIVIL,ZESTRIL) 5 MG tablet Take 1 tablet by mouth daily.    Marland Kitchen LUMIGAN 0.01 % SOLN Place 1 drop into both eyes as directed.    . metoprolol succinate (TOPROL-XL) 50 MG 24 hr tablet Take 50 mg by mouth 2 (two) times daily. Take with or immediately following a meal.    . NOVOFINE 32G X 6 MM MISC Use as directed  6  . ONE TOUCH ULTRA TEST test strip 1 strip by Other route daily. Use 1  strip to check glucose once daily  11  . pravastatin (PRAVACHOL) 40 MG tablet TAKE 1 TABLET BY MOUTH EVERY DAY IN THE EVENING 90 tablet 3  . Tafluprost (ZIOPTAN OP) Apply 1 drop to eye daily.    . timolol (TIMOPTIC) 0.25 % ophthalmic solution 1 drop 2 (two) times daily.    . Vitamin D, Ergocalciferol, (DRISDOL) 50000 units CAPS capsule Take 50,000 Units by mouth every 7 (seven) days.     No current facility-administered medications on file prior to visit.    Allergies  Allergen Reactions  . Shellfish Allergy Diarrhea    "SCALLOPS"    Past Medical History:  Diagnosis Date  . Abdominal wall hernia at previous stoma site   . Anemia   . CAD (coronary artery disease)    PCI 2003 2.75x13, 2.75x28 Zeta stents mid RCA  . CKD (chronic kidney disease), stage III ([place])   . Diabetes mellitus   . Diverticular disease   . HTN (hypertension)   . Hyperlipidemia   . Hypothyroid   . NSTEMI (non-ST elevated myocardial infarction) ([place]) 7/14   DES LAD  . SVT (supraventricular tachycardia) ([place])   . Thyroid disease   . Uterine cancer ([place])   . Ventral hernia     Past Surgical History:  Procedure Laterality Date  . ABDOMINAL HYSTERECTOMY    .  COLON SURGERY     colectomy  . colostomy reversal.    . coronary stents    . LEFT HEART CATHETERIZATION WITH CORONARY ANGIOGRAM N/A 01/14/2013   Procedure: LEFT HEART CATHETERIZATION WITH CORONARY ANGIOGRAM;  Surgeon: Sherren Mocha, MD;  Location: [facility] CATH LAB;  Service: Cardiovascular;  Laterality: N/A;  . PERCUTANEOUS CORONARY STENT INTERVENTION (PCI-S)  01/14/2013   Procedure: PERCUTANEOUS CORONARY STENT INTERVENTION (PCI-S);  Surgeon: Sherren Mocha, MD;  Location: [facility] CATH LAB;  Service: Cardiovascular;;    Social History   Tobacco Use  Smoking Status Never Smoker  Smokeless Tobacco Never Used    Social History   Substance and Sexual Activity  Alcohol Use No    Family History  Problem Relation Age of Onset  . Heart disease Mother         heart attack  . Cancer Father        prostate  . Diabetes Father     Review of Systems: As noted in history of present illness. All other systems were reviewed and are negative.  Physical Exam: There were no vitals taken for this visit. GENERAL:  Well appearing WF in NAD HEENT:  PERRL, EOMI, sclera are clear. Oropharynx is clear. NECK:  No jugular venous distention, carotid upstroke brisk and symmetric, no bruits, no thyromegaly or adenopathy LUNGS:  Clear to auscultation bilaterally CHEST:  Unremarkable HEART:  RRR,  PMI not displaced or sustained,S1 and S2 within normal limits, no S3, no S4: no clicks, no rubs, no murmurs ABD:  Soft, nontender. BS +, no masses or bruits. No hepatomegaly, no splenomegaly EXT:  2 + pulses throughout, no edema, no cyanosis no clubbing SKIN:  Warm and dry.  No rashes NEURO:  Alert and oriented x 3. Cranial nerves II through XII intact. PSYCH:  Cognitively intact      LABORATORY DATA: Lab Results  Component Value Date   WBC 7.7 01/15/2013   HGB 12.3 01/15/2013   HCT 36.6 01/15/2013   PLT 165 01/15/2013   GLUCOSE 255 (H) 02/03/2015   CHOL 143 01/13/2013   TRIG 194 (H) 01/13/2013   HDL 44 01/13/2013   LDLCALC 60 01/13/2013   ALT 30 01/13/2013   AST 116 (H) 01/13/2013   NA 137 02/03/2015   K 4.6 02/03/2015   CL 99 02/03/2015   CREATININE 1.43 (H) 02/03/2015   BUN 28 (H) 02/03/2015   CO2 26 02/03/2015   TSH 1.974 01/13/2013   INR 1.08 01/13/2013   HGBA1C 7.9 (H) 01/13/2013    Labs dated 12/14/15: cholesterol 142, triglycerides 129, LDL 60, HDL 56. BUN 25, creatinine 1.6. CMET otherwise normal. TSH normal Dated 04/08/16: glucose 312. A1c 7.6%. Dated 12/28/16: cholesterol 143, triglycerides 131, HDL 57, LDL 60. A1c 7.1%. BUN 40, creatinine 1.7. Glucose 205. Otherwise CMET and TSH normal.  Dated 05/02/17: A1c 7.5% Dated 01/23/18: cholesterol 144, triglycerides 106, HDL 59, LDL 64. Hgb 12.9. creatinine 1.7. TSH and ALT normal Dated  06/08/18: A1c 8.2%  Ecg today shows NSR with normal Ecg. I have personally reviewed and interpreted this study.  Echo: 01/19/15:Study Conclusions  - Left ventricle: The cavity size was normal. Systolic function was normal. The estimated ejection fraction was in the range of 55% to 60%. Wall motion was normal; there were no regional wall motion abnormalities. There was an increased relative contribution of atrial contraction to ventricular filling. Doppler parameters are consistent with abnormal left ventricular relaxation (grade 1 diastolic dysfunction). - Aortic valve: Poorly visualized. Trileaflet; mildly thickened, mildly  calcified leaflets. - Mitral valve: Mild focal calcification of the anterior leaflet (medial segment(s)). - Pulmonary arteries: PA peak pressure: 32 mm Hg (S).  Assessment / Plan: 1. Coronary disease with  NSTEMI in July of 2014 status post DES of the proximal LAD. Prior stents in the right coronary from 2003 were patent. Continue ASA.  She is asymptomatic.   2. Ischemic cardiomyopathy- EF  returned to normal post revascularization with stenting. Chronic diastolic CHF.  Doing well on lasix 40 mg daily. Continue sodium restriction.  3. Dyslipidemia. History of intolerance to high dose lipitor.  Tolerating pravastatin 40 mg daily. Lipid levels looked excellent in July  4. Diabetes mellitus type 2. per Dr. Forde Dandy.   5. Hypothyroidism. Normal TSH  6. HTN.   I will follow up in one year

## 2019-08-05 ENCOUNTER — Ambulatory Visit: Payer: Medicare Other | Admitting: Cardiology

## 2019-09-11 NOTE — Progress Notes (Signed)
Virtual Visit via Telephone Note   This visit type was conducted due to national recommendations for restrictions regarding the COVID-19 Pandemic (e.g. social distancing) in an effort to limit this patient's exposure and mitigate transmission in our community.  Due to her co-morbid illnesses, this patient is at least at moderate risk for complications without adequate follow up.  This format is felt to be most appropriate for this patient at this time.  The patient did not have access to video technology/had technical difficulties with video requiring transitioning to audio format only (telephone).  All issues noted in this document were discussed and addressed.  No physical exam could be performed with this format.  Please refer to the patient's chart for her  consent to telehealth for [facility].   The patient was identified using 2 identifiers.  Date:  09/12/2019   ID:  Catherine Mason, DOB [birth year], MRN [id number]  Patient Location: Home Provider Location: Home  PCP:  Reynold Bowen, MD  Cardiologist:  Chrystopher Stangl Martinique, MD  Electrophysiologist:  None   Evaluation Performed:  Follow-Up Visit  Chief Complaint:  CAD  History of Present Illness:    Catherine Mason is a 86 y.o. female with history of coronary disease and is status post stenting of the right coronary in 2003 with overlapping bare-metal stents. This includes a 2.75 x 28 and 2.75 x 13 mm Zeta stents. In July 2014 she was admitted with a non-ST elevation myocardial infarction. Cardiac catheterization demonstrated 99% stenosis in the proximal LAD. The left circumflex and right coronaries were patent. Ejection fraction by echocardiogram was 35-40% with septal and apical akinesis. She underwent successful stenting of the proximal LAD with a drug-eluting stent on 01/14/2013.  Follow up Echo in November 2014 and in July 2016 showed normal LV function.   On follow up today she is doing OK. She is nearing 86 yo. She walks with a  walker. No longer drives. Denies any chest pain, dyspnea or edema. Had one episode where she awoke at 4 am with her heart racing. States her sugar was low. Only lasted 5 minutes.   The patient does not have symptoms concerning for COVID-19 infection (fever, chills, cough, or new shortness of breath).    Past Medical History:  Diagnosis Date  . Abdominal wall hernia at previous stoma site   . Anemia   . CAD (coronary artery disease)    PCI 2003 2.75x13, 2.75x28 Zeta stents mid RCA  . CKD (chronic kidney disease), stage III   . Diabetes mellitus   . Diverticular disease   . HTN (hypertension)   . Hyperlipidemia   . Hypothyroid   . NSTEMI (non-ST elevated myocardial infarction) ([place]) 7/14   DES LAD  . SVT (supraventricular tachycardia) ([place])   . Thyroid disease   . Uterine cancer ([place])   . Ventral hernia    Past Surgical History:  Procedure Laterality Date  . ABDOMINAL HYSTERECTOMY    . COLON SURGERY     colectomy  . colostomy reversal.    . coronary stents    . LEFT HEART CATHETERIZATION WITH CORONARY ANGIOGRAM N/A 01/14/2013   Procedure: LEFT HEART CATHETERIZATION WITH CORONARY ANGIOGRAM;  Surgeon: Sherren Mocha, MD;  Location: [facility] CATH LAB;  Service: Cardiovascular;  Laterality: N/A;  . PERCUTANEOUS CORONARY STENT INTERVENTION (PCI-S)  01/14/2013   Procedure: PERCUTANEOUS CORONARY STENT INTERVENTION (PCI-S);  Surgeon: Sherren Mocha, MD;  Location: [facility] CATH LAB;  Service: Cardiovascular;;     Current Meds  Medication  Sig  . amLODipine (NORVASC) 5 MG tablet Take 1 tablet (5 mg total) by mouth daily.  Marland Kitchen aspirin EC 81 MG tablet Take 81 mg by mouth daily.  . furosemide (LASIX) 40 MG tablet Take 1 tablet (40 mg total) by mouth daily.  . insulin glargine, 2 Unit Dial, (TOUJEO MAX) 300 UNIT/ML Solostar Pen Take 12 units every morning  . levothyroxine (SYNTHROID, LEVOTHROID) 100 MCG tablet Take 100 mcg by mouth daily before breakfast.   . lisinopril (PRINIVIL,ZESTRIL) 5 MG tablet  Take 1 tablet by mouth daily.  Marland Kitchen LUMIGAN 0.01 % SOLN Place 1 drop into both eyes as directed.  . metoprolol succinate (TOPROL-XL) 50 MG 24 hr tablet Take 50 mg by mouth 2 (two) times daily. Take with or immediately following a meal.  . NOVOFINE 32G X 6 MM MISC Use as directed  . ONE TOUCH ULTRA TEST test strip 1 strip by Other route daily. Use 1 strip to check glucose once daily  . pravastatin (PRAVACHOL) 40 MG tablet TAKE 1 TABLET BY MOUTH EVERY DAY IN THE EVENING  . Tafluprost (ZIOPTAN OP) Apply 1 drop to eye daily.  . timolol (TIMOPTIC) 0.25 % ophthalmic solution 1 drop 2 (two) times daily.  . Vitamin D, Ergocalciferol, (DRISDOL) 50000 units CAPS capsule Take 50,000 Units by mouth every 7 (seven) days.  . [DISCONTINUED] Insulin Glargine (TOUJEO SOLOSTAR) 300 UNIT/ML SOPN Inject 20 Units into the skin daily.      Allergies:   Shellfish allergy   Social History   Tobacco Use  . Smoking status: Never Smoker  . Smokeless tobacco: Never Used  Substance Use Topics  . Alcohol use: No  . Drug use: No     Family Hx: The patient's family history includes Cancer in her father; Diabetes in her father; Heart disease in her mother.  ROS:   Please see the history of present illness.    All other systems reviewed and are negative.   Prior CV studies:   The following studies were reviewed today:  Echo: 01/19/15:Study Conclusions  - Left ventricle: The cavity size was normal. Systolic function was normal. The estimated ejection fraction was in the range of 55% to 60%. Wall motion was normal; there were no regional wall motion abnormalities. There was an increased relative contribution of atrial contraction to ventricular filling. Doppler parameters are consistent with abnormal left ventricular relaxation (grade 1 diastolic dysfunction). - Aortic valve: Poorly visualized. Trileaflet; mildly thickened, mildly calcified leaflets. - Mitral valve: Mild focal calcification of  the anterior leaflet (medial segment(s)). - Pulmonary arteries: PA peak pressure: 32 mm Hg (S).  Labs/Other Tests and Data Reviewed:    EKG:  No ECG reviewed.  Recent Labs: No results found for requested labs within last 8760 hours.   Recent Lipid Panel Lab Results  Component Value Date/Time   CHOL 143 01/13/2013 04:30 AM   TRIG 194 (H) 01/13/2013 04:30 AM   HDL 44 01/13/2013 04:30 AM   CHOLHDL 3.3 01/13/2013 04:30 AM   LDLCALC 60 01/13/2013 04:30 AM   Labs dated 12/14/15: cholesterol 142, triglycerides 129, LDL 60, HDL 56. BUN 25, creatinine 1.6. CMET otherwise normal. TSH normal Dated 04/08/16: glucose 312. A1c 7.6%. Dated 12/28/16: cholesterol 143, triglycerides 131, HDL 57, LDL 60. A1c 7.1%. BUN 40, creatinine 1.7. Glucose 205. Otherwise CMET and TSH normal.  Dated 05/02/17: A1c 7.5% Dated 01/23/18: cholesterol 144, triglycerides 106, HDL 59, LDL 64. Hgb 12.9. creatinine 1.7. TSH and ALT normal Dated 06/08/18: A1c 8.2% Dated  5.26.20: cholesterol 143, triglycerides 119, HDL 58, LDL 61. A1c 7.4%. creatinine 1.8. otherwise CMET, CBC, TSH normal.   Wt Readings from Last 3 Encounters:  09/12/19 147 lb (66.7 kg)  08/03/18 163 lb 12.8 oz (74.3 kg)  07/18/17 162 lb 6.4 oz (73.7 kg)     Objective:    Vital Signs:  BP 127/68   Pulse 63   Ht 5\' 4"  (1.626 m)   Wt 147 lb (66.7 kg)   BMI 25.23 kg/m    VITAL SIGNS:  reviewed  ASSESSMENT & PLAN:    1. Coronary disease with  NSTEMI in July of 2014 status post DES of the proximal LAD. Prior stents in the right coronary from 2003 were patent. Continue ASA.  She is asymptomatic.   2. Ischemic cardiomyopathy- EF  returned to normal post revascularization with stenting. Chronic diastolic CHF.  Doing well on lasix 40 mg daily. weight is stable. Continue sodium restriction.  3. Dyslipidemia.  Tolerating pravastatin 40 mg daily. Lipid levels looked excellent in May  4. Diabetes mellitus type 2. per Dr. Forde Dandy.   5. Hypothyroidism.  Normal TSH  6. HTN. controlled  COVID-19 Education: The signs and symptoms of COVID-19 were discussed with the patient and how to seek care for testing (follow up with PCP or arrange E-visit).  The importance of social distancing was discussed today.  Time:   Today, I have spent 10 minutes with the patient with telehealth technology discussing the above problems.     Medication Adjustments/Labs and Tests Ordered: Current medicines are reviewed at length with the patient today.  Concerns regarding medicines are outlined above.   Tests Ordered: No orders of the defined types were placed in this encounter.   Medication Changes: No orders of the defined types were placed in this encounter.   Follow Up:  In Person in 1 year(s)  Signed, Daimen Shovlin Martinique, MD  09/12/2019 9:20 AM    [place]

## 2019-09-12 ENCOUNTER — Telehealth: Payer: Medicare Other | Admitting: Cardiology

## 2019-09-12 ENCOUNTER — Telehealth (INDEPENDENT_AMBULATORY_CARE_PROVIDER_SITE_OTHER): Payer: Medicare Other | Admitting: Cardiology

## 2019-09-12 ENCOUNTER — Encounter: Payer: Self-pay | Admitting: Cardiology

## 2019-09-12 VITALS — BP 127/68 | HR 63 | Ht 64.0 in | Wt 147.0 lb

## 2019-09-12 DIAGNOSIS — I2510 Atherosclerotic heart disease of native coronary artery without angina pectoris: Secondary | ICD-10-CM

## 2019-09-12 DIAGNOSIS — N1831 Chronic kidney disease, stage 3a: Secondary | ICD-10-CM

## 2019-09-12 DIAGNOSIS — I10 Essential (primary) hypertension: Secondary | ICD-10-CM

## 2019-09-12 NOTE — Patient Instructions (Signed)
Medication Instructions:  Continue same medications *If you need a refill on your cardiac medications before your next appointment, please call your pharmacy*   Lab Work: None ordered    Testing/Procedures: None ordered   Follow-Up: At [facility], you and your health needs are our priority.  As part of our continuing mission to provide you with exceptional heart care, we have created designated Provider Care Teams.  These Care Teams include your primary Cardiologist (physician) and Advanced Practice Providers (APPs -  Physician Assistants and Nurse Practitioners) who all work together to provide you with the care you need, when you need it.  We recommend signing up for the patient portal called "MyChart".  Sign up information is provided on this After Visit Summary.  MyChart is used to connect with patients for Virtual Visits (Telemedicine).  Patients are able to view lab/test results, encounter notes, upcoming appointments, etc.  Non-urgent messages can be sent to your provider as well.   To learn more about what you can do with MyChart, go to [email or web address].      Your next appointment:  1 year    Call in Nov to schedule a March appointment    The format for your next appointment: Office      Provider: Dr.Jordan

## 2019-09-28 ENCOUNTER — Other Ambulatory Visit: Payer: Self-pay | Admitting: Cardiology

## 2019-09-28 DIAGNOSIS — I2510 Atherosclerotic heart disease of native coronary artery without angina pectoris: Secondary | ICD-10-CM

## 2019-11-20 ENCOUNTER — Other Ambulatory Visit: Payer: Self-pay | Admitting: Cardiology

## 2020-02-17 ENCOUNTER — Other Ambulatory Visit: Payer: Self-pay | Admitting: Cardiology

## 2020-02-17 DIAGNOSIS — I2510 Atherosclerotic heart disease of native coronary artery without angina pectoris: Secondary | ICD-10-CM

## 2020-08-17 ENCOUNTER — Other Ambulatory Visit: Payer: Self-pay | Admitting: Cardiology

## 2020-11-26 ENCOUNTER — Other Ambulatory Visit: Payer: Self-pay | Admitting: Cardiology

## 2020-11-26 DIAGNOSIS — I2510 Atherosclerotic heart disease of native coronary artery without angina pectoris: Secondary | ICD-10-CM

## 2020-12-20 ENCOUNTER — Other Ambulatory Visit: Payer: Self-pay | Admitting: Cardiology

## 2020-12-20 DIAGNOSIS — I2510 Atherosclerotic heart disease of native coronary artery without angina pectoris: Secondary | ICD-10-CM

## 2021-02-09 ENCOUNTER — Other Ambulatory Visit: Payer: Self-pay | Admitting: Cardiology

## 2021-02-09 DIAGNOSIS — I2510 Atherosclerotic heart disease of native coronary artery without angina pectoris: Secondary | ICD-10-CM

## 2021-03-04 ENCOUNTER — Telehealth: Payer: Self-pay | Admitting: Cardiology

## 2021-03-11 ENCOUNTER — Other Ambulatory Visit: Payer: Self-pay | Admitting: Cardiology

## 2021-03-11 DIAGNOSIS — I2510 Atherosclerotic heart disease of native coronary artery without angina pectoris: Secondary | ICD-10-CM

## 2021-04-10 ENCOUNTER — Other Ambulatory Visit: Payer: Self-pay

## 2021-04-10 ENCOUNTER — Inpatient Hospital Stay (HOSPITAL_COMMUNITY)
Admission: EM | Admit: 2021-04-10 | Discharge: 2021-04-21 | DRG: 074 | Disposition: A | Discharge status: Skilled Nursing Facility | Payer: Medicare Other | Attending: Internal Medicine | Admitting: Internal Medicine

## 2021-04-10 ENCOUNTER — Emergency Department (HOSPITAL_COMMUNITY): Payer: Medicare Other

## 2021-04-10 ENCOUNTER — Encounter (HOSPITAL_COMMUNITY): Payer: Self-pay

## 2021-04-10 DIAGNOSIS — E878 Other disorders of electrolyte and fluid balance, not elsewhere classified: Secondary | ICD-10-CM | POA: Diagnosis not present

## 2021-04-10 DIAGNOSIS — R29898 Other symptoms and signs involving the musculoskeletal system: Secondary | ICD-10-CM | POA: Diagnosis present

## 2021-04-10 DIAGNOSIS — E1142 Type 2 diabetes mellitus with diabetic polyneuropathy: Principal | ICD-10-CM | POA: Diagnosis present

## 2021-04-10 DIAGNOSIS — I2510 Atherosclerotic heart disease of native coronary artery without angina pectoris: Secondary | ICD-10-CM | POA: Diagnosis present

## 2021-04-10 DIAGNOSIS — R1310 Dysphagia, unspecified: Secondary | ICD-10-CM | POA: Diagnosis present

## 2021-04-10 DIAGNOSIS — I5032 Chronic diastolic (congestive) heart failure: Secondary | ICD-10-CM | POA: Diagnosis present

## 2021-04-10 DIAGNOSIS — E875 Hyperkalemia: Secondary | ICD-10-CM | POA: Diagnosis not present

## 2021-04-10 DIAGNOSIS — Z833 Family history of diabetes mellitus: Secondary | ICD-10-CM

## 2021-04-10 DIAGNOSIS — Z8249 Family history of ischemic heart disease and other diseases of the circulatory system: Secondary | ICD-10-CM

## 2021-04-10 DIAGNOSIS — F32A Depression, unspecified: Secondary | ICD-10-CM | POA: Diagnosis present

## 2021-04-10 DIAGNOSIS — N179 Acute kidney failure, unspecified: Secondary | ICD-10-CM | POA: Diagnosis present

## 2021-04-10 DIAGNOSIS — Z91013 Allergy to seafood: Secondary | ICD-10-CM

## 2021-04-10 DIAGNOSIS — I130 Hypertensive heart and chronic kidney disease with heart failure and stage 1 through stage 4 chronic kidney disease, or unspecified chronic kidney disease: Secondary | ICD-10-CM | POA: Diagnosis present

## 2021-04-10 DIAGNOSIS — Z20822 Contact with and (suspected) exposure to covid-19: Secondary | ICD-10-CM | POA: Diagnosis present

## 2021-04-10 DIAGNOSIS — R531 Weakness: Secondary | ICD-10-CM

## 2021-04-10 DIAGNOSIS — N183 Chronic kidney disease, stage 3 unspecified: Secondary | ICD-10-CM | POA: Diagnosis present

## 2021-04-10 DIAGNOSIS — Z7982 Long term (current) use of aspirin: Secondary | ICD-10-CM

## 2021-04-10 DIAGNOSIS — N2589 Other disorders resulting from impaired renal tubular function: Secondary | ICD-10-CM | POA: Diagnosis present

## 2021-04-10 DIAGNOSIS — R54 Age-related physical debility: Secondary | ICD-10-CM | POA: Diagnosis present

## 2021-04-10 DIAGNOSIS — E119 Type 2 diabetes mellitus without complications: Secondary | ICD-10-CM

## 2021-04-10 DIAGNOSIS — E1144 Type 2 diabetes mellitus with diabetic amyotrophy: Secondary | ICD-10-CM | POA: Diagnosis present

## 2021-04-10 DIAGNOSIS — M4804 Spinal stenosis, thoracic region: Secondary | ICD-10-CM | POA: Diagnosis present

## 2021-04-10 DIAGNOSIS — I776 Arteritis, unspecified: Secondary | ICD-10-CM | POA: Diagnosis present

## 2021-04-10 DIAGNOSIS — Z8542 Personal history of malignant neoplasm of other parts of uterus: Secondary | ICD-10-CM

## 2021-04-10 DIAGNOSIS — E785 Hyperlipidemia, unspecified: Secondary | ICD-10-CM | POA: Diagnosis present

## 2021-04-10 DIAGNOSIS — E039 Hypothyroidism, unspecified: Secondary | ICD-10-CM | POA: Diagnosis not present

## 2021-04-10 DIAGNOSIS — M48061 Spinal stenosis, lumbar region without neurogenic claudication: Secondary | ICD-10-CM | POA: Diagnosis not present

## 2021-04-10 DIAGNOSIS — Z9181 History of falling: Secondary | ICD-10-CM

## 2021-04-10 DIAGNOSIS — Z79890 Hormone replacement therapy (postmenopausal): Secondary | ICD-10-CM

## 2021-04-10 DIAGNOSIS — Z79899 Other long term (current) drug therapy: Secondary | ICD-10-CM

## 2021-04-10 DIAGNOSIS — E1122 Type 2 diabetes mellitus with diabetic chronic kidney disease: Secondary | ICD-10-CM | POA: Diagnosis not present

## 2021-04-10 DIAGNOSIS — E1165 Type 2 diabetes mellitus with hyperglycemia: Secondary | ICD-10-CM | POA: Diagnosis present

## 2021-04-10 DIAGNOSIS — M4802 Spinal stenosis, cervical region: Secondary | ICD-10-CM | POA: Diagnosis not present

## 2021-04-10 DIAGNOSIS — Z794 Long term (current) use of insulin: Secondary | ICD-10-CM

## 2021-04-10 DIAGNOSIS — G912 (Idiopathic) normal pressure hydrocephalus: Secondary | ICD-10-CM

## 2021-04-10 DIAGNOSIS — E872 Acidosis, unspecified: Secondary | ICD-10-CM | POA: Diagnosis not present

## 2021-04-10 DIAGNOSIS — J8410 Pulmonary fibrosis, unspecified: Secondary | ICD-10-CM | POA: Diagnosis not present

## 2021-04-10 DIAGNOSIS — N1832 Chronic kidney disease, stage 3b: Secondary | ICD-10-CM | POA: Diagnosis present

## 2021-04-10 DIAGNOSIS — G6181 Chronic inflammatory demyelinating polyneuritis: Secondary | ICD-10-CM | POA: Diagnosis present

## 2021-04-10 DIAGNOSIS — I252 Old myocardial infarction: Secondary | ICD-10-CM

## 2021-04-10 DIAGNOSIS — Z955 Presence of coronary angioplasty implant and graft: Secondary | ICD-10-CM

## 2021-04-10 DIAGNOSIS — I10 Essential (primary) hypertension: Secondary | ICD-10-CM | POA: Diagnosis present

## 2021-04-10 LAB — I-STAT CHEM 8, ED
BUN: 31 mg/dL — ABNORMAL HIGH (ref 8–23)
Calcium, Ion: 1.15 mmol/L (ref 1.15–1.40)
Chloride: 113 mmol/L — ABNORMAL HIGH (ref 98–111)
Creatinine, Ser: 1.8 mg/dL — ABNORMAL HIGH (ref 0.44–1.00)
Glucose, Bld: 158 mg/dL — ABNORMAL HIGH (ref 70–99)
HCT: 40 % (ref 36.0–46.0)
Hemoglobin: 13.6 g/dL (ref 12.0–15.0)
Potassium: 4.8 mmol/L (ref 3.5–5.1)
Sodium: 142 mmol/L (ref 135–145)
TCO2: 19 mmol/L — ABNORMAL LOW (ref 22–32)

## 2021-04-10 LAB — CBC WITH DIFFERENTIAL/PLATELET
Abs Immature Granulocytes: 0.02 10*3/uL (ref 0.00–0.07)
Basophils Absolute: 0.1 10*3/uL (ref 0.0–0.1)
Basophils Relative: 1 %
Eosinophils Absolute: 0.3 10*3/uL (ref 0.0–0.5)
Eosinophils Relative: 4 %
HCT: 40 % (ref 36.0–46.0)
Hemoglobin: 12.3 g/dL (ref 12.0–15.0)
Immature Granulocytes: 0 %
Lymphocytes Relative: 35 %
Lymphs Abs: 2.4 10*3/uL (ref 0.7–4.0)
MCH: 30.4 pg (ref 26.0–34.0)
MCHC: 30.8 g/dL (ref 30.0–36.0)
MCV: 98.8 fL (ref 80.0–100.0)
Monocytes Absolute: 0.5 10*3/uL (ref 0.1–1.0)
Monocytes Relative: 7 %
Neutro Abs: 3.6 10*3/uL (ref 1.7–7.7)
Neutrophils Relative %: 53 %
Platelets: 153 10*3/uL (ref 150–400)
RBC: 4.05 MIL/uL (ref 3.87–5.11)
RDW: 13.2 % (ref 11.5–15.5)
WBC: 6.8 10*3/uL (ref 4.0–10.5)
nRBC: 0 % (ref 0.0–0.2)

## 2021-04-10 LAB — PROTIME-INR
INR: 1 (ref 0.8–1.2)
Prothrombin Time: 12.7 seconds (ref 11.4–15.2)

## 2021-04-10 LAB — COMPREHENSIVE METABOLIC PANEL
ALT: 23 U/L (ref 0–44)
AST: 25 U/L (ref 15–41)
Albumin: 3.8 g/dL (ref 3.5–5.0)
Alkaline Phosphatase: 156 U/L — ABNORMAL HIGH (ref 38–126)
Anion gap: 8 (ref 5–15)
BUN: 33 mg/dL — ABNORMAL HIGH (ref 8–23)
CO2: 18 mmol/L — ABNORMAL LOW (ref 22–32)
Calcium: 8.7 mg/dL — ABNORMAL LOW (ref 8.9–10.3)
Chloride: 112 mmol/L — ABNORMAL HIGH (ref 98–111)
Creatinine, Ser: 1.8 mg/dL — ABNORMAL HIGH (ref 0.44–1.00)
GFR, Estimated: 26 mL/min — ABNORMAL LOW (ref >60)
Glucose, Bld: 159 mg/dL — ABNORMAL HIGH (ref 70–99)
Potassium: 5 mmol/L (ref 3.5–5.1)
Sodium: 138 mmol/L (ref 135–145)
Total Bilirubin: 0.9 mg/dL (ref 0.3–1.2)
Total Protein: 7.4 g/dL (ref 6.5–8.1)

## 2021-04-10 LAB — RESP PANEL BY RT-PCR (FLU A&B, COVID) ARPGX2
Influenza A by PCR: NEGATIVE
Influenza B by PCR: NEGATIVE
SARS Coronavirus 2 by RT PCR: NEGATIVE

## 2021-04-10 LAB — APTT: aPTT: 27 seconds (ref 24–36)

## 2021-04-10 IMAGING — MR MR HEAD WO/W CM
14 of 20 series · 33 of 48 positions shown · IV contrast (gadavist)
Comparison: None.

CLINICAL DATA: Acute neurologic deficit

EXAM:
MRI HEAD WITHOUT AND WITH CONTRAST
TECHNIQUE: Multiplanar, multiecho pulse sequences of the brain and surrounding
structures were obtained without and with intravenous contrast.
CONTRAST:  6mL GADAVIST GADOBUTROL 1 MMOL/ML IV SOLN, 6mL GADAVIST
GADOBUTROL 1 MMOL/ML IV SOLN

[Series 5: DWI · axial · 3.0mm · 0.88mm/px · z∈[-107,+42]mm · 4 of 102 slices shown (1 of 4)]
[im 1/102]
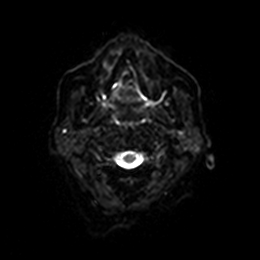
[im 34/102]
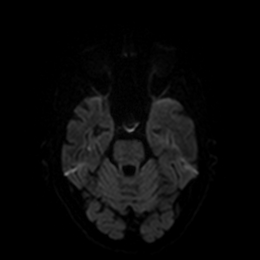
[im 68/102]
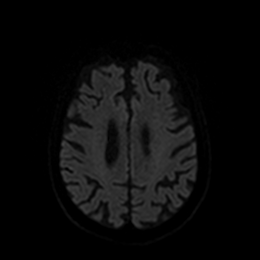
[im 102/102]
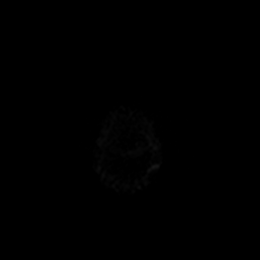

[Series 6: DWI · axial · 3.0mm · 0.88mm/px · z∈[-107,+42]mm · 3 of 51 slices shown (2 of 4)]
[im 1/51]
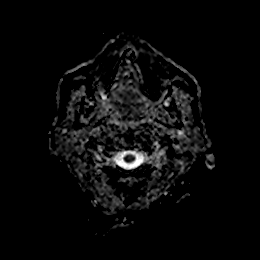
[im 26/51]
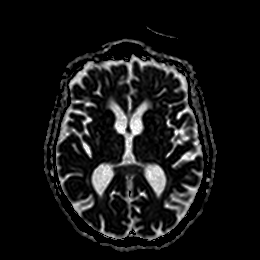
[im 51/51]
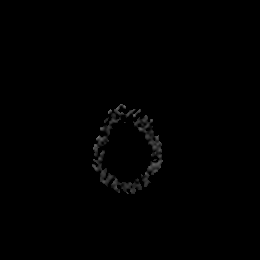

[Series 7: DWI · coronal · 4.0mm · 0.88mm/px · 4 of 70 slices shown (3 of 4)]
[im 1/70]
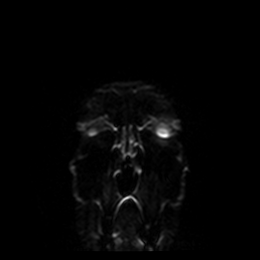
[im 24/70]
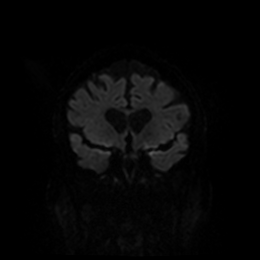
[im 47/70]
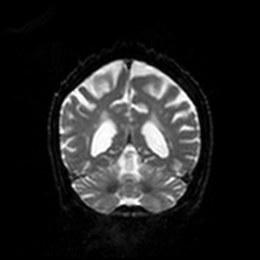
[im 70/70]
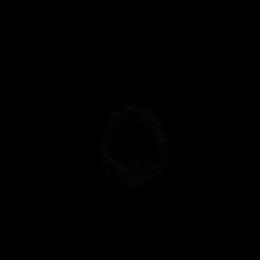

[Series 8: DWI · coronal · 4.0mm · 0.88mm/px · 2 of 35 slices shown (4 of 4)]
[im 1/35]
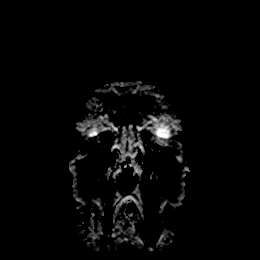
[im 35/35]
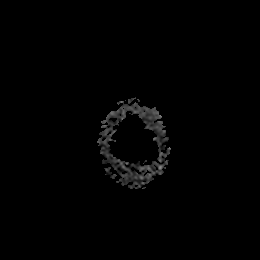

[Series 9: T1 · sagittal · 5.0mm · 0.75mm/px · 1 of 24 slices shown]
[im 1/24]
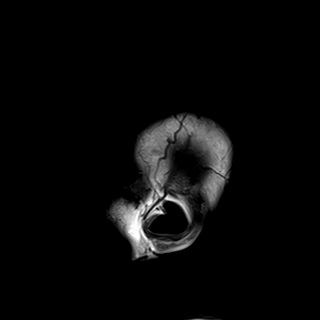

[Series 10: T2 · axial · 5.0mm · 0.72mm/px · 1 of 27 slices shown]
[im 1/27]
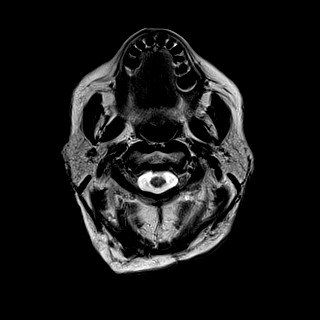

[Series 11: FLAIR · axial · 5.0mm · 0.45mm/px · 1 of 27 slices shown]
[im 1/27]
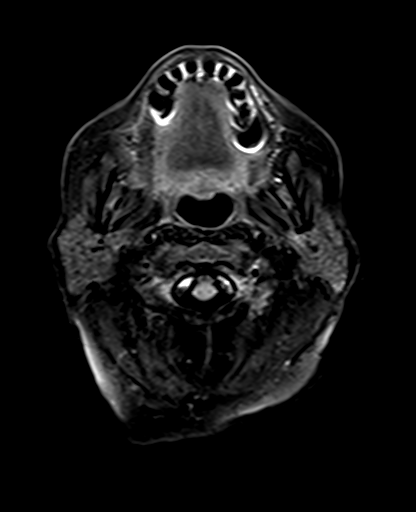

[Series 12: mag_images · axial · 3.0mm · 0.90mm/px · z∈[-127,+50]mm · 3 of 60 slices shown]
[im 1/60]
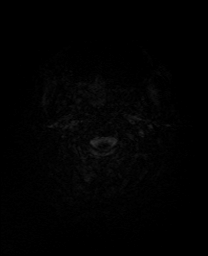
[im 30/60]
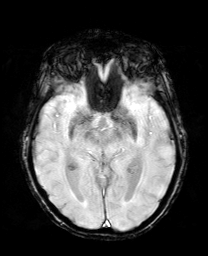
[im 60/60]
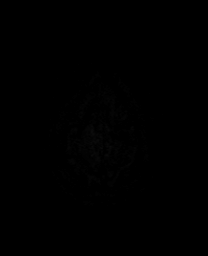

[Series 13: pha_images · axial · 3.0mm · 0.90mm/px · z∈[-124,+50]mm · 3 of 59 slices shown]
[im 1/59]
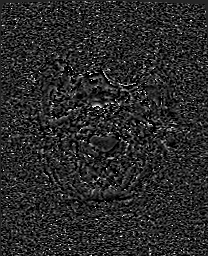
[im 30/59]
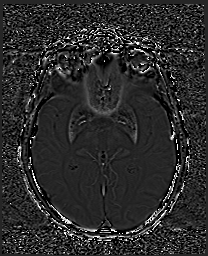
[im 59/59]
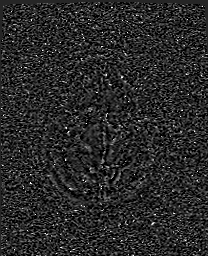

[Series 14: swi_images · axial · 3.0mm · 0.90mm/px · z∈[-127,+50]mm · 3 of 60 slices shown]
[im 1/60]
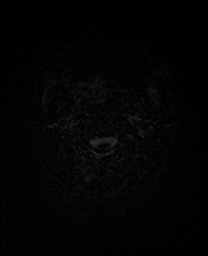
[im 30/60]
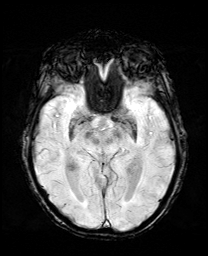
[im 60/60]
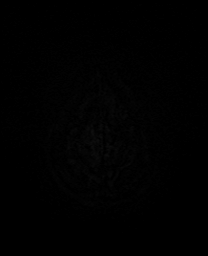

[Series 15: mip_images(sw) · axial · 24.0mm · 0.90mm/px · z∈[-116,+40]mm · 3 of 53 slices shown]
[im 1/53]
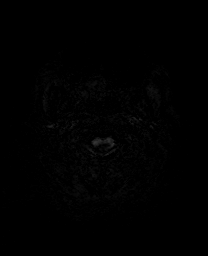
[im 27/53]
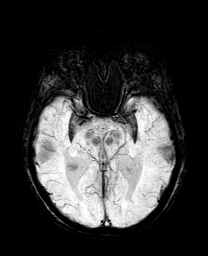
[im 53/53]
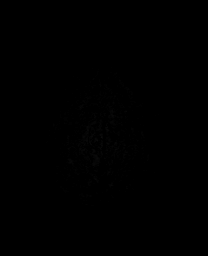

[Series 17: T2 post-contrast · coronal · 5.0mm · 0.72mm/px · 2 of 30 slices shown]
[im 1/30]
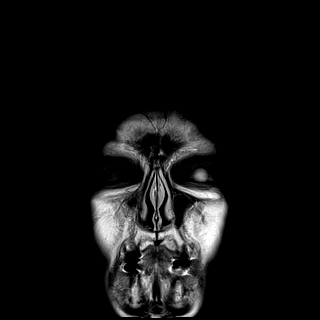
[im 30/30]
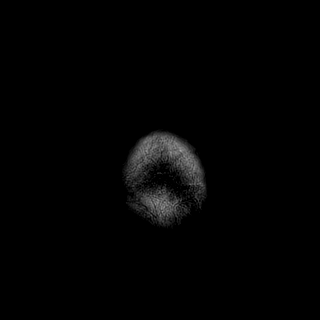

[Series 19: T1 post-contrast · coronal · 5.0mm · 0.34mm/px · 2 of 30 slices shown (1 of 2)]
[im 1/30]
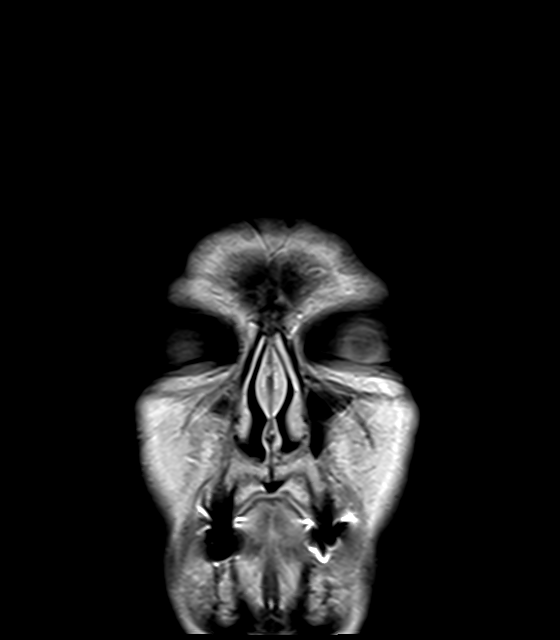
[im 30/30]
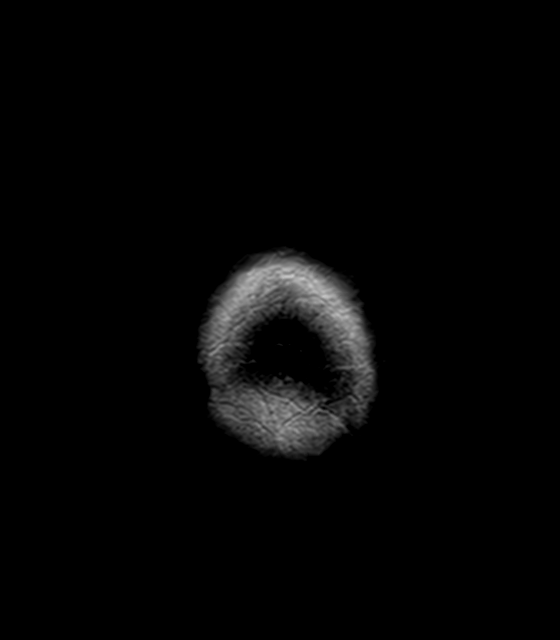

[Series 20: T1 post-contrast · sagittal · 5.0mm · 0.72mm/px · 1 of 24 slices shown (2 of 2)]
[im 1/24]
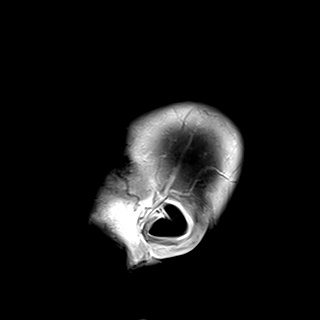

[33 of 48 positions shown; findings below may reference images not displayed]

FINDINGS: Brain: No acute infarct, mass effect or extra-axial collection. No
acute or chronic hemorrhage. There is multifocal hyperintense
T2-weighted signal within the white matter. Generalized volume loss
without a clear lobar predilection. The midline structures are
normal.

Vascular: Major flow voids are preserved.

Skull and upper cervical spine: Normal calvarium and skull base.
Visualized upper cervical spine and soft tissues are normal.

Sinuses/Orbits:No paranasal sinus fluid levels or advanced mucosal
thickening. No mastoid or middle ear effusion. Normal orbits.
IMPRESSION: 1. No acute intracranial abnormality.
2. Findings of chronic microvascular disease and mild volume loss.

## 2021-04-10 IMAGING — MR MR LUMBAR SPINE WO/W CM
7 of 17 series · 16 of 48 positions shown · IV contrast (gadavist)
Comparison: None.

CLINICAL DATA: Motor neuron disease

EXAM:
MRI LUMBAR SPINE WITHOUT AND WITH CONTRAST
TECHNIQUE: Multiplanar and multiecho pulse sequences of the lumbar spine were
obtained without and with intravenous contrast.
CONTRAST:  6mL GADAVIST GADOBUTROL 1 MMOL/ML IV SOLN

[Series 5: T2 · sagittal · 4.0mm · 0.73mm/px · 1 of 22 slices shown (1 of 2)]
[im 1/22]
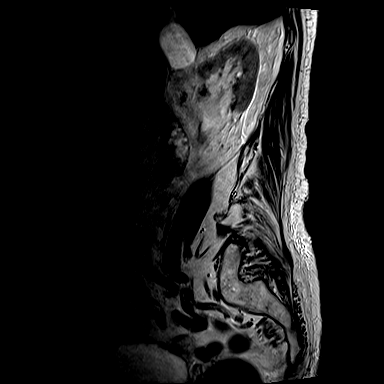

[Series 6: STIR · sagittal · 4.0mm · 0.55mm/px · 2 of 22 slices shown]
[im 1/22]
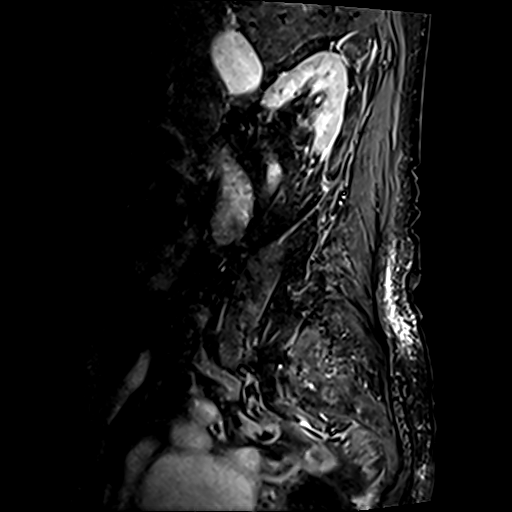
[im 22/22]
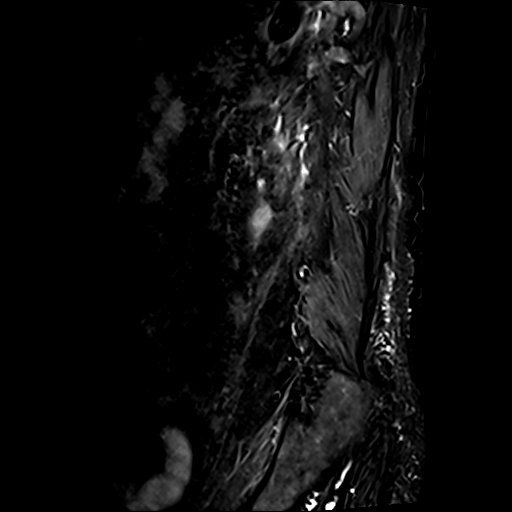

[Series 7: T1 · sagittal · 4.0mm · 0.88mm/px · 2 of 20 slices shown (1 of 2)]
[im 1/20]
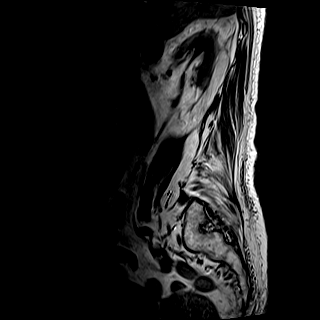
[im 20/20]
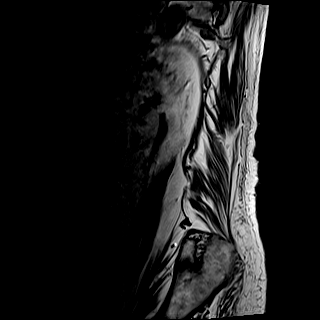

[Series 8: T2 · axial · 4.0mm · 0.57mm/px · z∈[-137,+96]mm · 3 of 34 slices shown (2 of 2)]
[im 1/34]
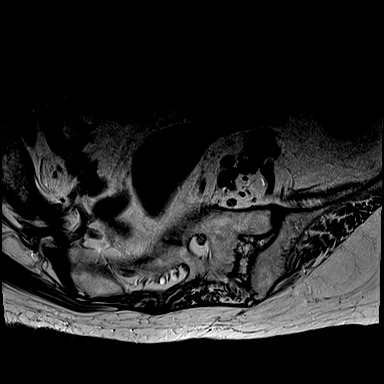
[im 17/34]
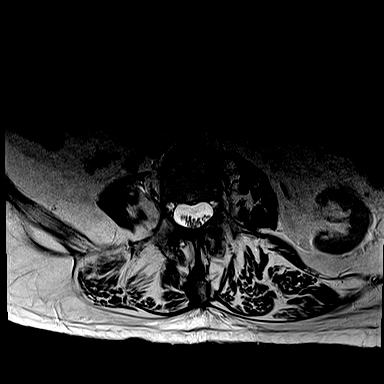
[im 34/34]
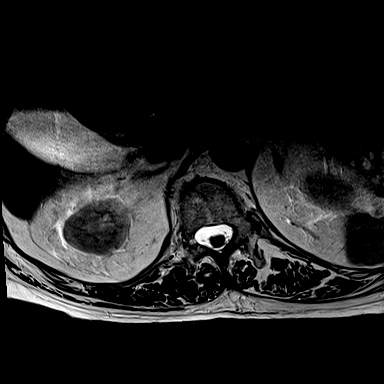

[Series 9: T1 · axial · 4.0mm · 0.34mm/px · z∈[-137,+96]mm · 3 of 34 slices shown (2 of 2)]
[im 1/34]
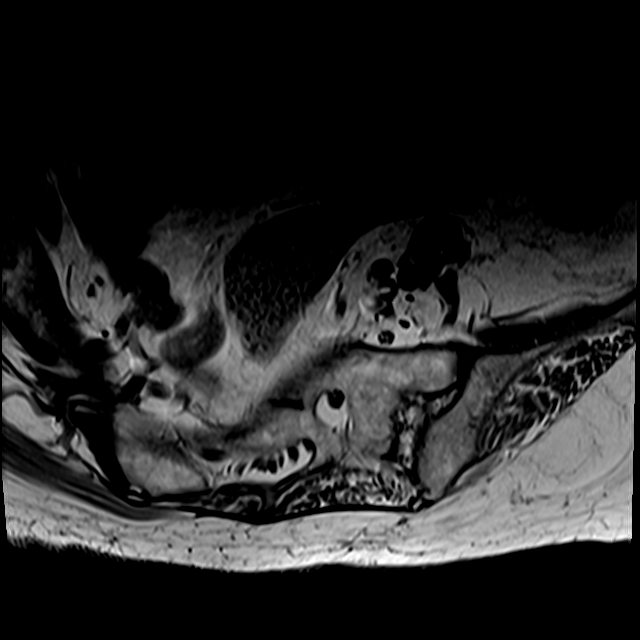
[im 17/34]
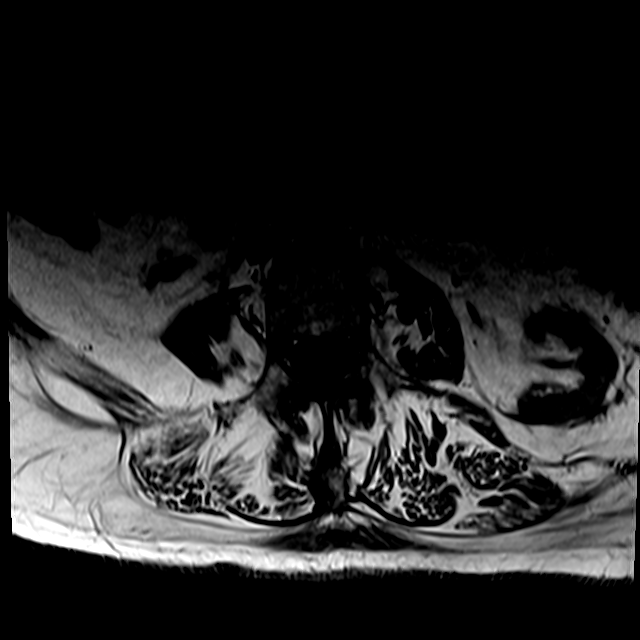
[im 34/34]
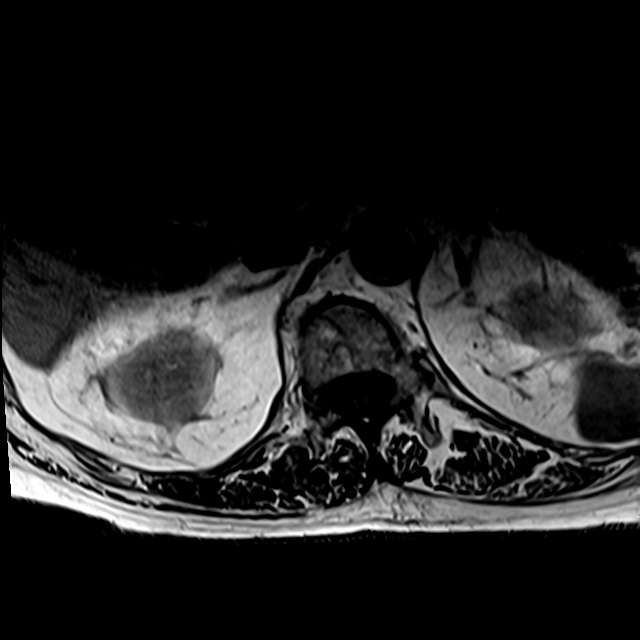

[Series 10: T1 post-contrast · axial · 4.0mm · 0.34mm/px · z∈[-137,+96]mm · 3 of 34 slices shown]
[im 1/34]
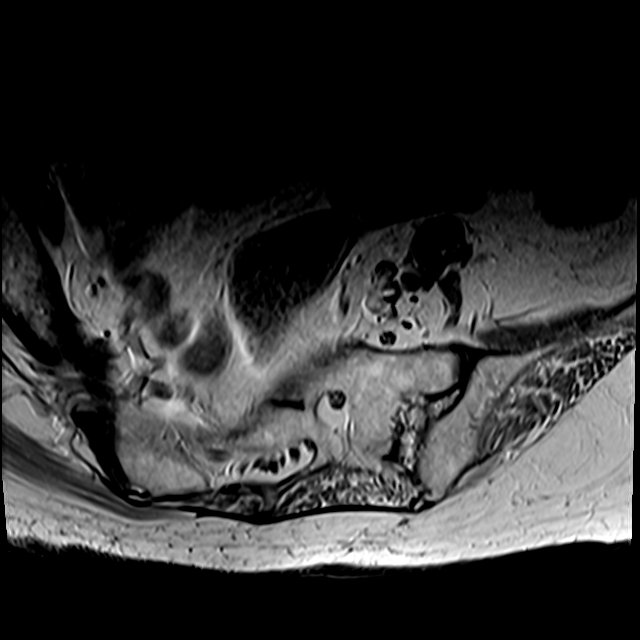
[im 17/34]
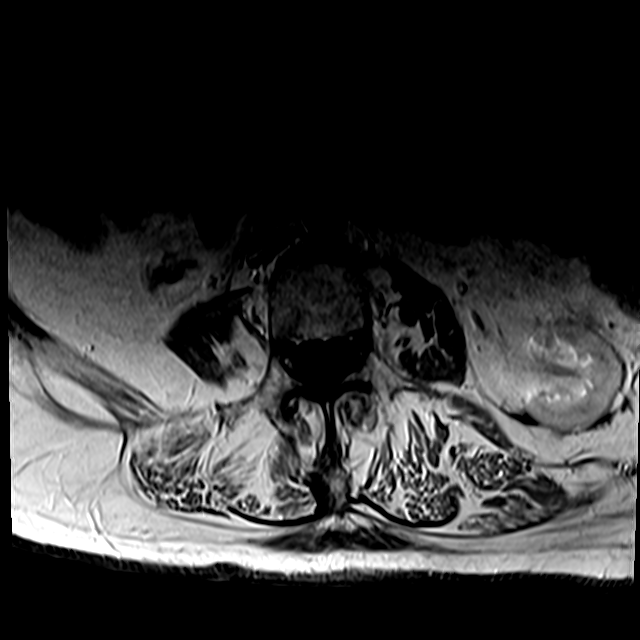
[im 34/34]
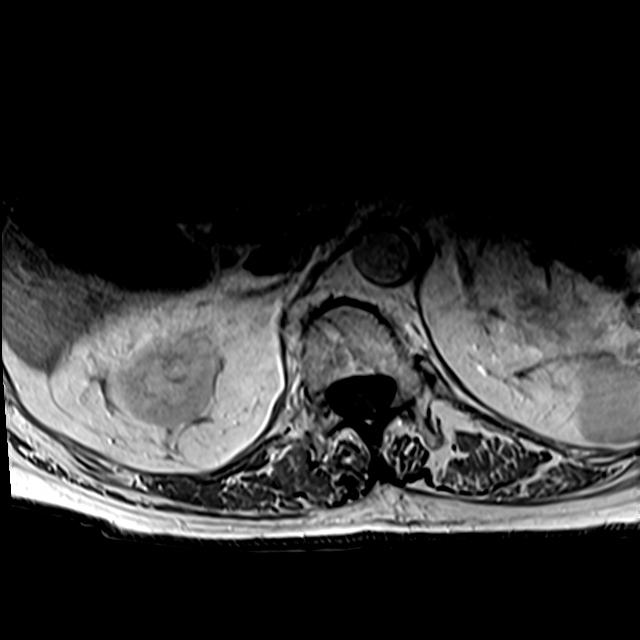

[Series 11: T1 fat-sat post-contrast · sagittal · 4.0mm · 0.88mm/px · 2 of 22 slices shown]
[im 1/22]
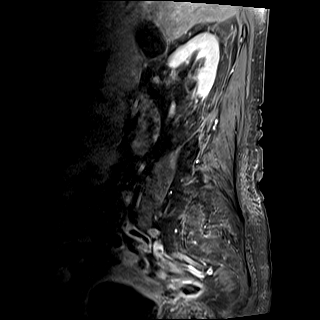
[im 22/22]
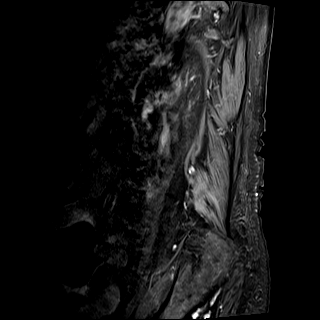

[16 of 48 positions shown; findings below may reference images not displayed]

FINDINGS: Segmentation:  Standard

Alignment:  Dextroscoliosis with apex at L3

Vertebrae:  No acute abnormality.

Conus medullaris and cauda equina: Conus extends to the L2 level.
Conus and cauda equina appear normal. No abnormal contrast
enhancement.

Paraspinal and other soft tissues: Fatty atrophy of the paraspinous
musculature.

Disc levels:

T12-L1: Left asymmetric disc bulge.  No stenosis.

L1-L2: Left asymmetric disc bulge and mild facet hypertrophy. No
spinal canal stenosis. Mild left neural foraminal stenosis.

L2-L3: Moderate facet hypertrophy with small disc bulge. No spinal
canal stenosis. Mild left neural foraminal stenosis.

L3-L4: Small disc bulge with mild facet hypertrophy. No spinal canal
stenosis. No neural foraminal stenosis.

L4-L5: Small disc bulge with severe right and mild left facet
hypertrophy. No spinal canal stenosis. Mild right neural foraminal
stenosis.

L5-S1: Severe right and mild left facet hypertrophy. Small right
asymmetric disc bulge. No spinal canal stenosis. Mild right neural
foraminal stenosis.

Visualized sacrum: Normal.
IMPRESSION: 1. No acute abnormality of the lumbar spine.
2. Dextroscoliosis with apex at L3.
3. Multilevel moderate to severe facet arthrosis, worst at right
L4-5 and L5-S1.
4. Mild neural foraminal stenosis at left L1-L2, left L2-L3, right
L4-L5 and right L5-S1.

## 2021-04-10 MED ORDER — GADOBUTROL 1 MMOL/ML IV SOLN
6.0000 mL | Freq: Once | INTRAVENOUS | Status: AC | PRN
  Administered 2021-04-10: 6 mL via INTRAVENOUS

## 2021-04-10 NOTE — ED Triage Notes (Signed)
Patient brought in by EMS for weakness. States that the patient was ambulatory yesterday but not able to get up to use the bathroom today.    BP:138/57 HR:73 SpO2:99% CBG: 163

## 2021-04-10 NOTE — ED Notes (Signed)
Pt transported from [facility] ED to [facility] ED for MRI.  Placed on recliner on arrival.

## 2021-04-10 NOTE — Plan of Care (Signed)
On call phone note Bilateral lower extremity weakness that she woke up with. Progressively worsening gait throughout the week. +reflexes in LE. Weak on b/l UE with mild drift. Labs with OK lytes with CKD4 No imaging available at [facility] - no CT Or MRI today. Does not sound like stroke. Needs lumbar spine imaging and brain imaging -preferably MRI. ER looking to see what they can do for  Please call back when imaging is available. Likely going to need transfer for consultation.  -- Amie Portland, MD Neurologist [facility] Pager: [phone]

## 2021-04-10 NOTE — ED Notes (Signed)
Patient states that she was getting up to use the bathroom and her leg felt numb.  Patient states that she was able to get to the bathroom but had to crawl back to the bedroom because she wasn't able to put any weight on her left leg.

## 2021-04-10 NOTE — ED Provider Notes (Signed)
[facility] EMERGENCY DEPARTMENT Provider Note   CSN: [id number] Arrival date & time: 04/10/21  1335     History Chief Complaint  Patient presents with   Weakness    Catherine Mason is a 87 y.o. female presents emergency department chief complaint of weakness.  History is given by the patient and her grandson who was at bedside.  Patient lives alone and has been doing very well for many years.  She normally ambulates with a walker.  1 month ago the patient began having numbness and tingling in a stocking distribution in her left lower extremity and now right.  She does have a past medical history of diabetes.  Patient reports that this morning she tried to get up to use the bathroom at about 4:40 and was unable to get out of bed or walk because she could not lift her legs.  She is never had this happen before.  She denies any other weakness, numbness, change in vision, difficulty with speech headache or word finding.  She has not had any recent vaccinations or been sick recently.  She denies back pain, saddle anesthesia or new bowel or bladder incontinence. Patient is on diuretic medication but denies any increased edema.  She has no confusion, fevers, chills, urinary symptoms.   Weakness     Past Medical History:  Diagnosis Date   Abdominal wall hernia at previous stoma site    Anemia    CAD (coronary artery disease)    PCI 2003 2.75x13, 2.75x28 Zeta stents mid RCA   CKD (chronic kidney disease), stage III (HCC)    Diabetes mellitus    Diverticular disease    HTN (hypertension)    Hyperlipidemia    Hypothyroid    NSTEMI (non-ST elevated myocardial infarction) ([place]) 7/14   DES LAD   SVT (supraventricular tachycardia) (HCC)    Thyroid disease    Uterine cancer ([place])    Ventral hernia     Patient Active Problem List   Diagnosis Date Noted   Ventral hernia    Uterine cancer ([place])    Thyroid disease    Diverticular disease    Anemia    Chronic diastolic CHF (congestive  heart failure) ([place]) 02/09/2015   Ischemic cardiomyopathy 01/15/2013   Hypothyroid    NSTEMI (non-ST elevated myocardial infarction) ([place]) 01/12/2013   CAD (coronary artery disease)    HTN (hypertension)    Hyperlipidemia    SVT (supraventricular tachycardia) (HCC)    CKD (chronic kidney disease), stage III ([place])    Abdominal wall hernia at previous stoma site 04/21/2011    Past Surgical History:  Procedure Laterality Date   ABDOMINAL HYSTERECTOMY     COLON SURGERY     colectomy   colostomy reversal.     coronary stents     LEFT HEART CATHETERIZATION WITH CORONARY ANGIOGRAM N/A 01/14/2013   Procedure: LEFT HEART CATHETERIZATION WITH CORONARY ANGIOGRAM;  Surgeon: Sherren Mocha, MD;  Location: [facility] CATH LAB;  Service: Cardiovascular;  Laterality: N/A;   PERCUTANEOUS CORONARY STENT INTERVENTION (PCI-S)  01/14/2013   Procedure: PERCUTANEOUS CORONARY STENT INTERVENTION (PCI-S);  Surgeon: Sherren Mocha, MD;  Location: [facility] CATH LAB;  Service: Cardiovascular;;     OB History   No obstetric history on file.     Family History  Problem Relation Age of Onset   Heart disease Mother        heart attack   Cancer Father        prostate   Diabetes Father  Social History   Tobacco Use   Smoking status: Never   Smokeless tobacco: Never  Vaping Use   Vaping Use: Never used  Substance Use Topics   Alcohol use: No   Drug use: No    Home Medications Prior to Admission medications   Medication Sig Start Date End Date Taking? Authorizing Provider  amLODipine (NORVASC) 5 MG tablet Take 1 tablet (5 mg total) by mouth daily. NEED OV. 03/11/21   [place]ique, Peter M, MD  aspirin EC 81 MG tablet Take 81 mg by mouth daily.    [provider]  furosemide (LASIX) 40 MG tablet TAKE 1 TABLET BY MOUTH EVERY DAY 08/18/20   Martin[place], Peter M, MD  insulin glargine, 2 Unit Dial, (TOUJEO MAX) 300 UNIT/ML Solostar Pen Take 12 units every morning 09/12/19   [place], Peter M, MD  levothyroxine  (SYNTHROID, LEVOTHROID) 100 MCG tablet Take 100 mcg by mouth daily before breakfast.     [provider]  lisinopril (PRINIVIL,ZESTRIL) 5 MG tablet Take 1 tablet by mouth daily. 06/10/16   [provider]  LUMIGAN 0.01 % SOLN Place 1 drop into both eyes as directed. 07/16/18   [provider]  metoprolol succinate (TOPROL-XL) 50 MG 24 hr tablet Take 50 mg by mouth 2 (two) times daily. Take with or immediately following a meal.    [provider]  NOVOFINE 32G X 6 MM MISC Use as directed 11/22/14   [provider]  ONE TOUCH ULTRA TEST test strip 1 strip by Other route daily. Use 1 strip to check glucose once daily 11/24/14   [provider]  pravastatin (PRAVACHOL) 40 MG tablet TAKE 1 TABLET BY MOUTH EVERY DAY IN THE EVENING 01/29/18   Martinique, Peter M, MD  Tafluprost (ZIOPTAN OP) Apply 1 drop to eye daily.    [provider]  timolol (TIMOPTIC) 0.25 % ophthalmic solution 1 drop 2 (two) times daily.    [provider]  Vitamin D, Ergocalciferol, (DRISDOL) 50000 units CAPS capsule Take 50,000 Units by mouth every 7 (seven) days.    [provider]    Allergies    Shellfish allergy  Review of Systems   Review of Systems  Neurological:  Positive for weakness.  Ten systems reviewed and are negative for acute change, except as noted in the HPI.   Physical Exam Updated Vital Signs BP (!) 153/71 (BP Location: Left Arm)   Pulse 67   Temp 97.8 F (36.6 C) (Oral)   Resp 17   SpO2 100%   Physical Exam Vitals and nursing note reviewed.  Constitutional:      General: She is not in acute distress.    Appearance: She is well-developed. She is not diaphoretic.  HENT:     Head: Normocephalic and atraumatic.     Right Ear: External ear normal.     Left Ear: External ear normal.     Nose: Nose normal.     Mouth/Throat:     Mouth: Mucous membranes are moist.  Eyes:     General: No visual field deficit or scleral  icterus.    Conjunctiva/sclera: Conjunctivae normal.  Cardiovascular:     Rate and Rhythm: Normal rate and regular rhythm.     Heart sounds: Normal heart sounds. No murmur heard.   No friction rub. No gallop.  Pulmonary:     Effort: Pulmonary effort is normal. No respiratory distress.     Breath sounds: Normal breath sounds.  Abdominal:  General: Bowel sounds are normal. There is no distension.     Palpations: Abdomen is soft. There is no mass.     Tenderness: There is no abdominal tenderness. There is no guarding.  Musculoskeletal:     Cervical back: Normal range of motion.  Skin:    General: Skin is warm and dry.  Neurological:     Mental Status: She is alert and oriented to person, place, and time.     GCS: GCS eye subscore is 4. GCS verbal subscore is 5. GCS motor subscore is 6.     Cranial Nerves: Cranial nerves are intact. No cranial nerve deficit, dysarthria or facial asymmetry.     Sensory: Sensory deficit (BL LE stocking distribution) present.     Motor: Weakness present. No pronator drift.     Deep Tendon Reflexes:     Reflex Scores:      Patellar reflexes are 2+ on the right side and 2+ on the left side.    Comments: 2/5 strength in the BL LE 4/5 strength BL  UE  Psychiatric:        Behavior: Behavior normal.    ED Results / Procedures / Treatments   Labs (all labs ordered are listed, but only abnormal results are displayed) Labs Reviewed  RESP PANEL BY RT-PCR (FLU A&B, COVID) ARPGX2  CBC WITH DIFFERENTIAL/PLATELET  COMPREHENSIVE METABOLIC PANEL  URINALYSIS, ROUTINE W REFLEX MICROSCOPIC  PROTIME-INR  APTT  I-STAT CHEM 8, ED    EKG None  Radiology No results found.  Procedures Procedures   Medications Ordered in ED Medications - No data to display  ED Course  I have reviewed the triage vital signs and the nursing notes.  Pertinent labs & imaging results that were available during my care of the patient were reviewed by me and considered in my  medical decision making (see chart for details).    MDM Rules/Calculators/A&P                           87 year old female who presents emergency department with chief complaint of lower extremity weakness.The differential diagnosis of weakness includes but is not limited to neurologic causes (GBS, myasthenia gravis, CVA, MS, ALS, transverse myelitis, spinal cord injury, CVA, botulism, ) and other causes: ACS, Arrhythmia, syncope, orthostatic hypotension, sepsis, hypoglycemia, electrolyte disturbance, hypothyroidism, respiratory failure, symptomatic anemia, dehydration, heat injury, polypharmacy, malignancy. On physical examination the patient has stocking distribution of neuropathic sensory changes consistent with peripheral neuropathy.  She has maintained 2+ patellar reflexes and I have no suspicion that this is representative of Guillain-Barr syndrome.  She is also not hyperreflexive suggestive of myelopathy.  Patient does have some upper extremity weakness but not as pronounced as her lower extremity weakness.  She does not have any back pain or red flag symptoms consistent with cauda equina. I ordered and reviewed labs that include CBC, CMP, APTT PT/INR and a respiratory panel. Patient CBC is without abnormality along with APTT/PT/INR.  Patient is negative for COVID or influenza.  CMP shows her baseline renal insufficiency no evidence of acute electrolyte abnormality such as hyponatremia or hypokalemia which could potentially cause weakness.  EKG shows sinus rhythm at a rate of 66 I discussed the case with Dr. Rory Percy who recommends patient get MRI of the brain and MRI of the lumbar spine.  I have ordered these images.  Case discussed with Dr. Dene Gentry at [facility] emergency department who is excepted the patient in  transfer.  I suspect that the patient will need admission given the fact that she lives alone and is unable to ambulate with new onset weakness.  Dr. Rory Percy has asked that neurology be  consulted after images are obtained.  I discussed all findings and need for transfer with the patient and her grandson at bedside. Final Clinical Impression(s) / ED Diagnoses Final diagnoses:  None    Rx / DC Orders ED Discharge Orders     None        Margarita Mail, PA-C 04/10/21 1825    Milton Ferguson, MD 04/10/21 2016

## 2021-04-10 NOTE — ED Provider Notes (Signed)
Accepted trasnfer from [facility] from [facility]. Please see prior provider note for more detail.   Briefly: Patient is 87 y.o.   DDX: concern for brain or lumbar spine abnormality in context of new onset lower extremity weakness and numbness.  Plan: MRI of brain and lumbar spine. Consult neurology based on MRI results. Likely admit for weakness and inability to care for ADLs.  12:02 AM Care of @PATIENTNAME @ transferred to Dr. Leonette Monarch at the end of my shift as the patient will require reassessment once labs/imaging have resulted. Patient presentation, ED course, and plan of care discussed with review of all pertinent labs and imaging. Please see his/her note for further details regarding further ED course and disposition. Plan at time of handoff is to consult Dr. Malen Gauze with neurology, plan for admission for weakness, inability to complete ADLs.  Further planning may depend on results of MRI and neurology recommendations. This may be altered or completely changed at the discretion of the oncoming team pending results of further workup.        Anselmo Pickler, PA-C 04/11/21 0002    Valarie Merino, MD 04/12/21 [phone]

## 2021-04-11 ENCOUNTER — Inpatient Hospital Stay (HOSPITAL_COMMUNITY): Payer: Medicare Other

## 2021-04-11 ENCOUNTER — Encounter (HOSPITAL_COMMUNITY): Payer: Self-pay | Admitting: Internal Medicine

## 2021-04-11 DIAGNOSIS — N1832 Chronic kidney disease, stage 3b: Secondary | ICD-10-CM | POA: Diagnosis present

## 2021-04-11 DIAGNOSIS — I5032 Chronic diastolic (congestive) heart failure: Secondary | ICD-10-CM | POA: Diagnosis present

## 2021-04-11 DIAGNOSIS — G8220 Paraplegia, unspecified: Secondary | ICD-10-CM

## 2021-04-11 DIAGNOSIS — F32A Depression, unspecified: Secondary | ICD-10-CM | POA: Diagnosis present

## 2021-04-11 DIAGNOSIS — R531 Weakness: Secondary | ICD-10-CM | POA: Diagnosis present

## 2021-04-11 DIAGNOSIS — I10 Essential (primary) hypertension: Secondary | ICD-10-CM

## 2021-04-11 DIAGNOSIS — M4804 Spinal stenosis, thoracic region: Secondary | ICD-10-CM | POA: Diagnosis present

## 2021-04-11 DIAGNOSIS — E875 Hyperkalemia: Secondary | ICD-10-CM | POA: Diagnosis present

## 2021-04-11 DIAGNOSIS — N2589 Other disorders resulting from impaired renal tubular function: Secondary | ICD-10-CM

## 2021-04-11 DIAGNOSIS — I2510 Atherosclerotic heart disease of native coronary artery without angina pectoris: Secondary | ICD-10-CM | POA: Diagnosis present

## 2021-04-11 DIAGNOSIS — J8410 Pulmonary fibrosis, unspecified: Secondary | ICD-10-CM | POA: Diagnosis present

## 2021-04-11 DIAGNOSIS — E1142 Type 2 diabetes mellitus with diabetic polyneuropathy: Secondary | ICD-10-CM | POA: Diagnosis present

## 2021-04-11 DIAGNOSIS — E1165 Type 2 diabetes mellitus with hyperglycemia: Secondary | ICD-10-CM | POA: Diagnosis present

## 2021-04-11 DIAGNOSIS — G6181 Chronic inflammatory demyelinating polyneuritis: Secondary | ICD-10-CM | POA: Diagnosis present

## 2021-04-11 DIAGNOSIS — M48061 Spinal stenosis, lumbar region without neurogenic claudication: Secondary | ICD-10-CM | POA: Diagnosis present

## 2021-04-11 DIAGNOSIS — E1122 Type 2 diabetes mellitus with diabetic chronic kidney disease: Secondary | ICD-10-CM | POA: Diagnosis present

## 2021-04-11 DIAGNOSIS — E119 Type 2 diabetes mellitus without complications: Secondary | ICD-10-CM | POA: Diagnosis not present

## 2021-04-11 DIAGNOSIS — Z8542 Personal history of malignant neoplasm of other parts of uterus: Secondary | ICD-10-CM | POA: Diagnosis not present

## 2021-04-11 DIAGNOSIS — R29898 Other symptoms and signs involving the musculoskeletal system: Secondary | ICD-10-CM | POA: Diagnosis not present

## 2021-04-11 DIAGNOSIS — E039 Hypothyroidism, unspecified: Secondary | ICD-10-CM | POA: Diagnosis present

## 2021-04-11 DIAGNOSIS — I776 Arteritis, unspecified: Secondary | ICD-10-CM | POA: Diagnosis present

## 2021-04-11 DIAGNOSIS — E1144 Type 2 diabetes mellitus with diabetic amyotrophy: Secondary | ICD-10-CM | POA: Diagnosis present

## 2021-04-11 DIAGNOSIS — R54 Age-related physical debility: Secondary | ICD-10-CM | POA: Diagnosis present

## 2021-04-11 DIAGNOSIS — E1140 Type 2 diabetes mellitus with diabetic neuropathy, unspecified: Secondary | ICD-10-CM | POA: Diagnosis not present

## 2021-04-11 DIAGNOSIS — E785 Hyperlipidemia, unspecified: Secondary | ICD-10-CM | POA: Diagnosis present

## 2021-04-11 DIAGNOSIS — N179 Acute kidney failure, unspecified: Secondary | ICD-10-CM | POA: Diagnosis present

## 2021-04-11 DIAGNOSIS — Z20822 Contact with and (suspected) exposure to covid-19: Secondary | ICD-10-CM | POA: Diagnosis present

## 2021-04-11 DIAGNOSIS — I130 Hypertensive heart and chronic kidney disease with heart failure and stage 1 through stage 4 chronic kidney disease, or unspecified chronic kidney disease: Secondary | ICD-10-CM | POA: Diagnosis present

## 2021-04-11 DIAGNOSIS — M4802 Spinal stenosis, cervical region: Secondary | ICD-10-CM | POA: Diagnosis present

## 2021-04-11 DIAGNOSIS — E872 Acidosis, unspecified: Secondary | ICD-10-CM | POA: Diagnosis present

## 2021-04-11 DIAGNOSIS — E878 Other disorders of electrolyte and fluid balance, not elsewhere classified: Secondary | ICD-10-CM | POA: Diagnosis present

## 2021-04-11 LAB — URINALYSIS, ROUTINE W REFLEX MICROSCOPIC
Bilirubin Urine: NEGATIVE
Glucose, UA: NEGATIVE mg/dL
Ketones, ur: NEGATIVE mg/dL
Nitrite: NEGATIVE
Protein, ur: 100 mg/dL — AB
Specific Gravity, Urine: 1.013 (ref 1.005–1.030)
WBC, UA: >50 WBC/hpf — ABNORMAL HIGH (ref 0–5)
pH: 7 (ref 5.0–8.0)

## 2021-04-11 LAB — CBG MONITORING, ED
Glucose-Capillary: 155 mg/dL — ABNORMAL HIGH (ref 70–99)
Glucose-Capillary: 175 mg/dL — ABNORMAL HIGH (ref 70–99)
Glucose-Capillary: 151 mg/dL — ABNORMAL HIGH (ref 70–99)

## 2021-04-11 LAB — COMPREHENSIVE METABOLIC PANEL
ALT: 17 U/L (ref 0–44)
AST: 18 U/L (ref 15–41)
Albumin: 2.9 g/dL — ABNORMAL LOW (ref 3.5–5.0)
Alkaline Phosphatase: 128 U/L — ABNORMAL HIGH (ref 38–126)
Anion gap: 7 (ref 5–15)
BUN: 27 mg/dL — ABNORMAL HIGH (ref 8–23)
CO2: 19 mmol/L — ABNORMAL LOW (ref 22–32)
Calcium: 8.8 mg/dL — ABNORMAL LOW (ref 8.9–10.3)
Chloride: 113 mmol/L — ABNORMAL HIGH (ref 98–111)
Creatinine, Ser: 1.57 mg/dL — ABNORMAL HIGH (ref 0.44–1.00)
GFR, Estimated: 31 mL/min — ABNORMAL LOW (ref >60)
Glucose, Bld: 206 mg/dL — ABNORMAL HIGH (ref 70–99)
Potassium: 4.9 mmol/L (ref 3.5–5.1)
Sodium: 139 mmol/L (ref 135–145)
Total Bilirubin: 0.8 mg/dL (ref 0.3–1.2)
Total Protein: 6.2 g/dL — ABNORMAL LOW (ref 6.5–8.1)

## 2021-04-11 LAB — GLUCOSE, CAPILLARY
Glucose-Capillary: 129 mg/dL — ABNORMAL HIGH (ref 70–99)
Glucose-Capillary: 241 mg/dL — ABNORMAL HIGH (ref 70–99)

## 2021-04-11 LAB — CBC
HCT: 38.3 % (ref 36.0–46.0)
Hemoglobin: 12.3 g/dL (ref 12.0–15.0)
MCH: 30.4 pg (ref 26.0–34.0)
MCHC: 32.1 g/dL (ref 30.0–36.0)
MCV: 94.8 fL (ref 80.0–100.0)
Platelets: 160 10*3/uL (ref 150–400)
RBC: 4.04 MIL/uL (ref 3.87–5.11)
RDW: 13.4 % (ref 11.5–15.5)
WBC: 6.4 10*3/uL (ref 4.0–10.5)
nRBC: 0 % (ref 0.0–0.2)

## 2021-04-11 LAB — MAGNESIUM: Magnesium: 2.1 mg/dL (ref 1.7–2.4)

## 2021-04-11 LAB — HEMOGLOBIN A1C
Hgb A1c MFr Bld: 9 % — ABNORMAL HIGH (ref 4.8–5.6)
Mean Plasma Glucose: 211.6 mg/dL

## 2021-04-11 LAB — CK: Total CK: 36 U/L — ABNORMAL LOW (ref 38–234)

## 2021-04-11 LAB — C-REACTIVE PROTEIN: CRP: 1 mg/dL — ABNORMAL HIGH (ref <1.0)

## 2021-04-11 IMAGING — MR MR THORACIC SPINE WO/W CM
5 of 9 series · 23 of 48 positions shown · IV contrast (Eovist)
Comparison: None.

CLINICAL DATA: Progressive myelopathy.

EXAM:
MRI THORACIC WITHOUT AND WITH CONTRAST
TECHNIQUE: Multiplanar and multiecho pulse sequences of the thoracic spine were
obtained without and with intravenous contrast.
CONTRAST:  7mL GADAVIST GADOBUTROL 1 MMOL/ML IV SOLN

[Series 23: T1 · sagittal · 6.0mm · 1.23mm/px · 1 of 9 slices shown (1 of 3)]
[im 1/9]
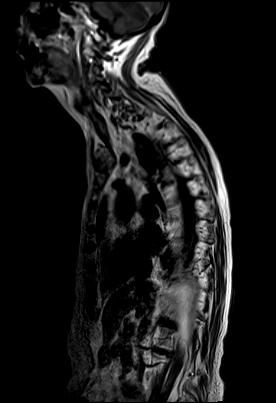

[Series 24: T2 · sagittal · 3.0mm · 0.76mm/px · 3 of 17 slices shown (1 of 2)]
[im 1/17]
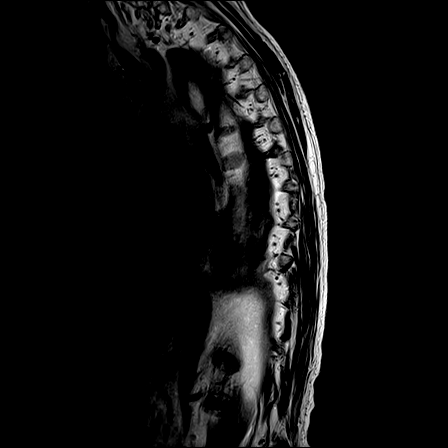
[im 9/17]
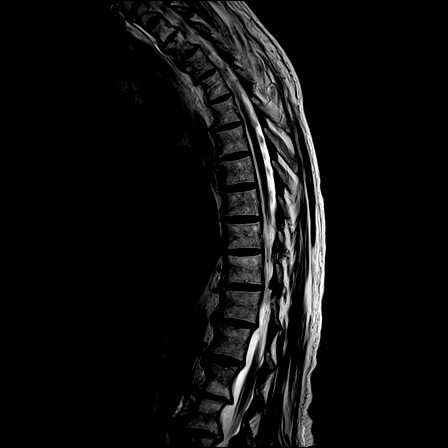
[im 17/17]
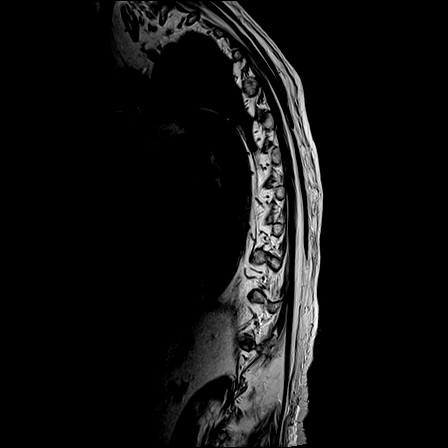

[Series 25: T1 · sagittal · 3.0mm · 0.76mm/px · 4 of 17 slices shown (2 of 3)]
[im 1/17]
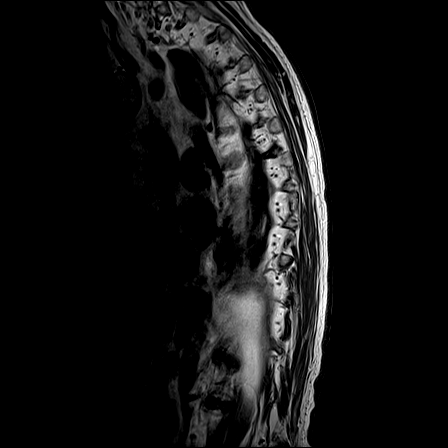
[im 6/17]
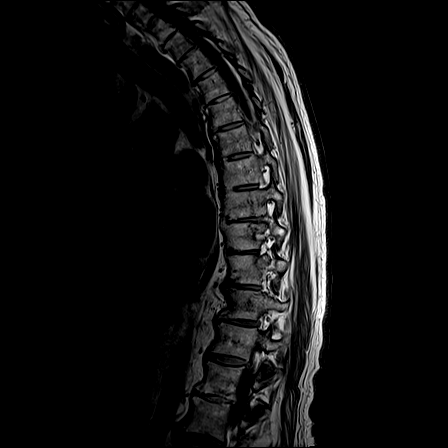
[im 11/17]
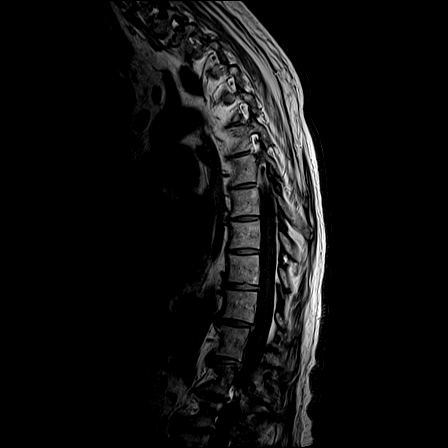
[im 17/17]
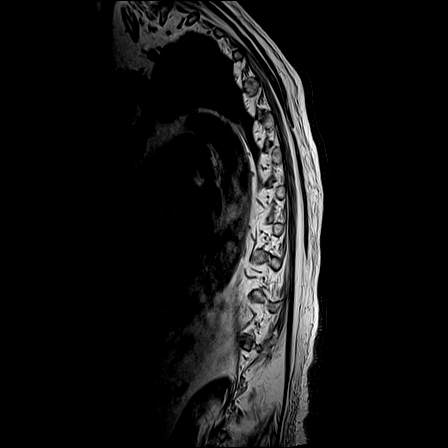

[Series 27: T2 · axial · 4.0mm · 0.59mm/px · z∈[-332,-125]mm · 8 of 39 slices shown (2 of 2)]
[im 1/39]
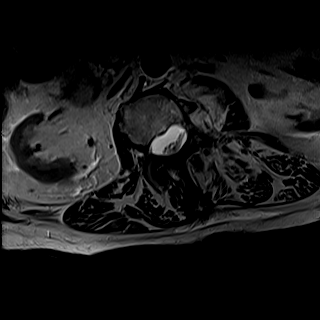
[im 6/39]
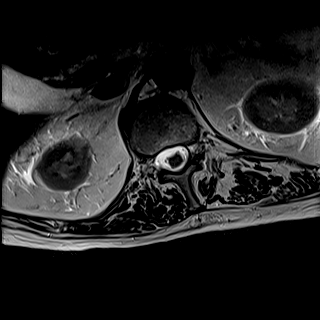
[im 11/39]
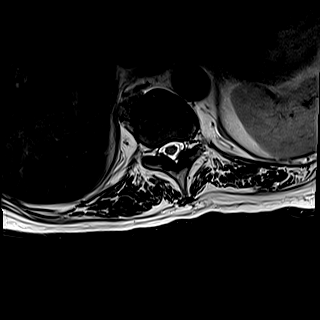
[im 17/39]
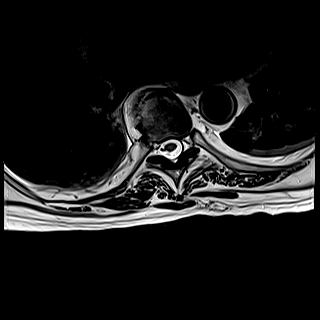
[im 22/39]
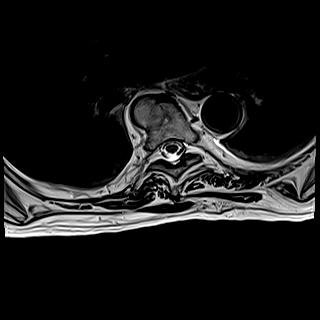
[im 28/39]
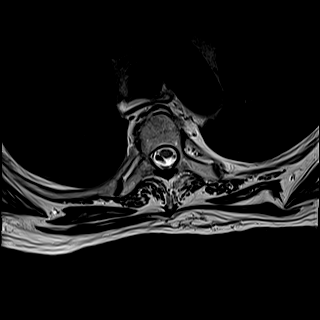
[im 33/39]
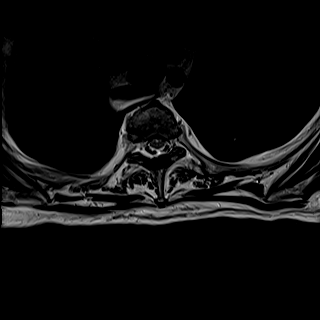
[im 39/39]
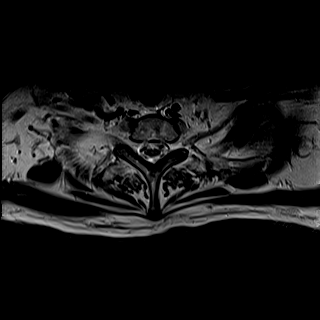

[Series 28: T1 · axial · non-contrast · 4.0mm · 0.31mm/px · z∈[-332,-140]mm · 7 of 39 slices shown (3 of 3)]
[im 1/39]
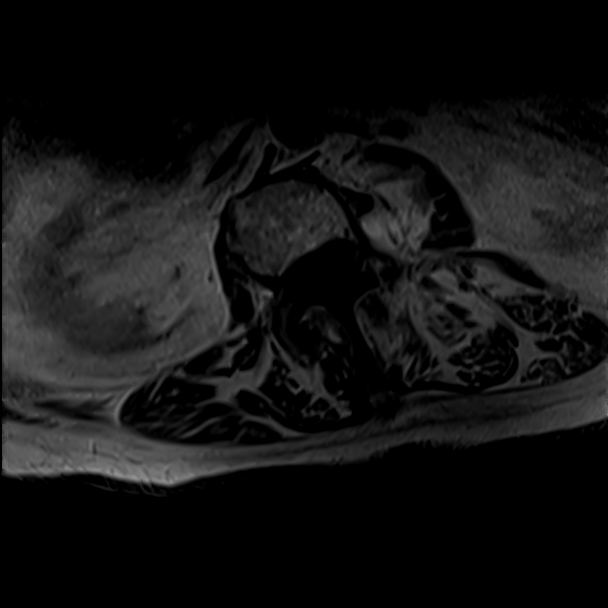
[im 6/39]
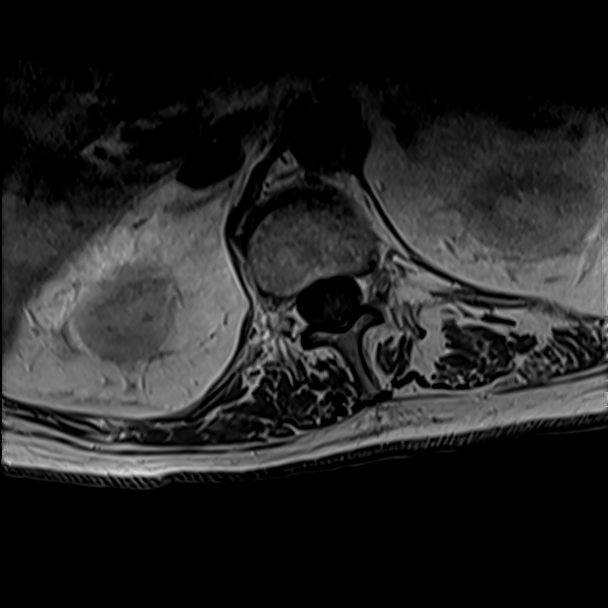
[im 11/39]
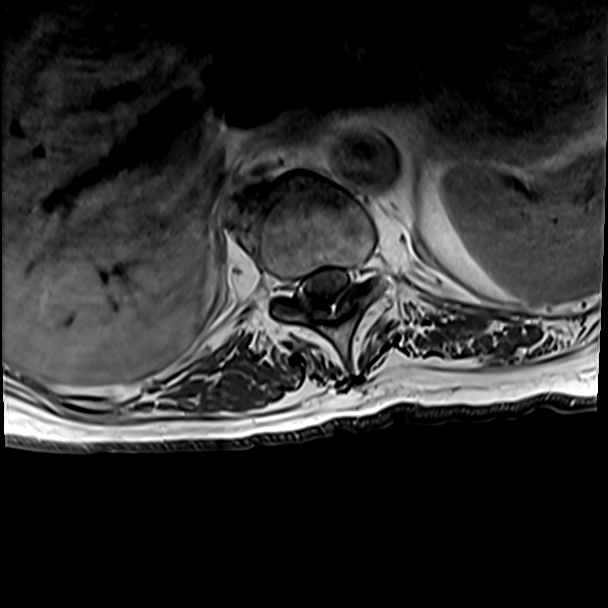
[im 17/39]
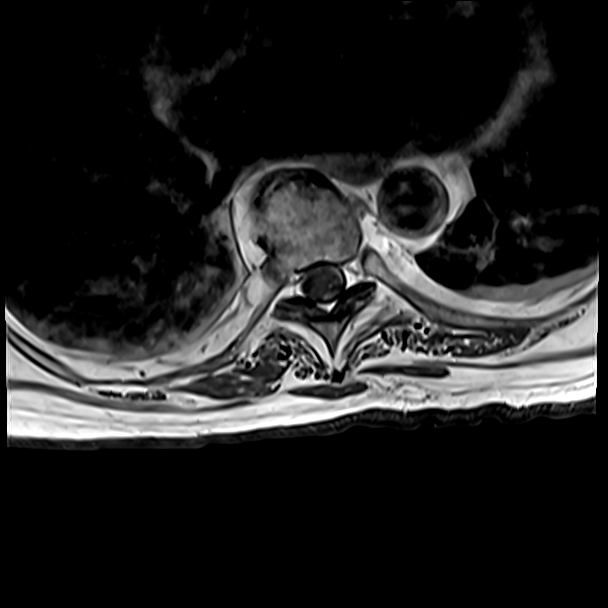
[im 22/39]
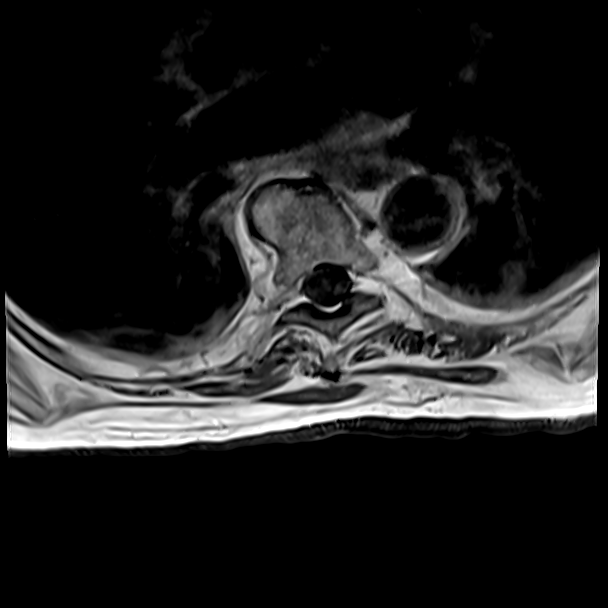
[im 28/39]
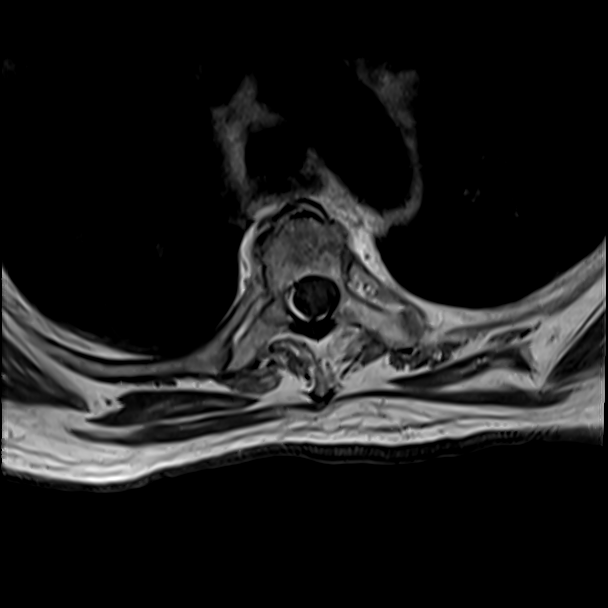
[im 33/39]
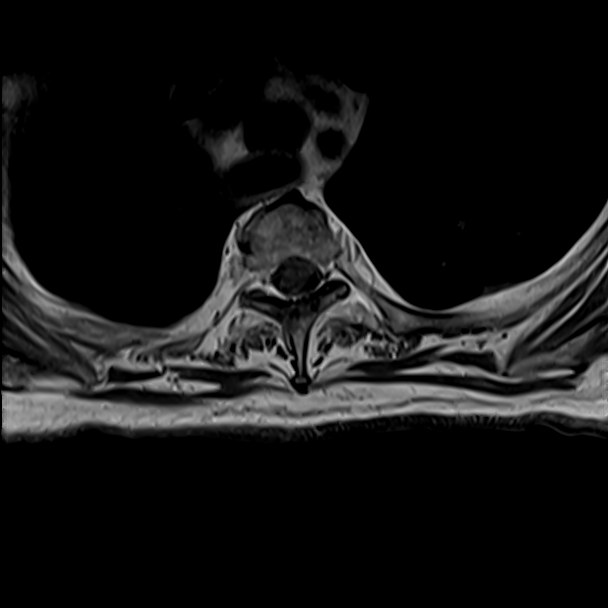

[23 of 48 positions shown; findings below may reference images not displayed]

FINDINGS: Alignment:  2 mm retrolisthesis of L1 on L2 and L2 on L3.

Vertebrae: No fracture, evidence of discitis, or bone lesion.

Cord:  Normal signal and morphology.

Paraspinal and other soft tissues: Negative.

Disc levels:

Disc spaces: Degenerative disease with disc height loss at T1-2.
Disc desiccation throughout the remainder of the thoracic spine.

T1-T2: Mild broad-based disc bulge. No foraminal or central canal
stenosis.

T2-T3: No disc protrusion, foraminal stenosis or central canal
stenosis.

T3-T4: No disc protrusion, foraminal stenosis or central canal
stenosis.

T4-T5: No disc protrusion, foraminal stenosis or central canal
stenosis.

T5-T6: Minimal broad-based disc bulge. No foraminal or central canal
stenosis.

T6-T7: No disc protrusion, foraminal stenosis or central canal
stenosis.

T7-T8: No disc protrusion, foraminal stenosis or central canal
stenosis.

T8-T9: No disc protrusion, foraminal stenosis or central canal
stenosis.

T9-T10: No disc protrusion, foraminal stenosis or central canal
stenosis.

T10-T11: Minimal broad-based disc bulge. Mild bilateral facet
arthropathy. No foraminal or central canal stenosis.

T11-T12: Minimal broad-based disc bulge. No foraminal or central
canal stenosis.

T12-L1: Mild broad-based disc bulge. No foraminal or central canal
stenosis.

L1-2: Mild broad-based disc bulge. Moderate bilateral facet
arthropathy. Mild left foraminal stenosis. Mild spinal stenosis.

L2-3: Broad-based disc osteophyte complex. Moderate bilateral facet
arthropathy.
IMPRESSION: 1.  No acute osseous injury of the thoracic spine.
2. No myelopathy of the thoracic spinal cord.
3. No evidence of nerve root impingement of the thoracic spine.

## 2021-04-11 IMAGING — MR MR CERVICAL SPINE WO/W CM
6 of 8 series · 28 of 48 positions shown · IV contrast (Eovist)
Comparison: None

CLINICAL DATA: Myelopathy, acute or progressive.

EXAM:
MRI CERVICAL SPINE WITHOUT AND WITH CONTRAST
TECHNIQUE: Multiplanar and multiecho pulse sequences of the cervical spine, to
include the craniocervical junction and cervicothoracic junction,
were obtained without and with intravenous contrast.
CONTRAST:  7mL GADAVIST GADOBUTROL 1 MMOL/ML IV SOLN

[Series 5: T2 · sagittal · 3.0mm · 0.56mm/px · 3 of 12 slices shown (1 of 2)]
[im 1/12]
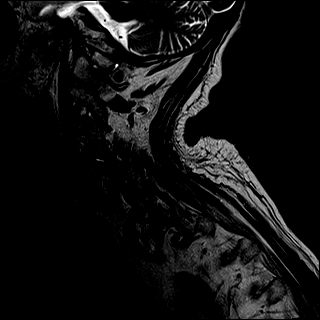
[im 6/12]
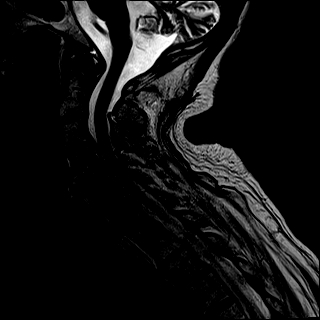
[im 12/12]
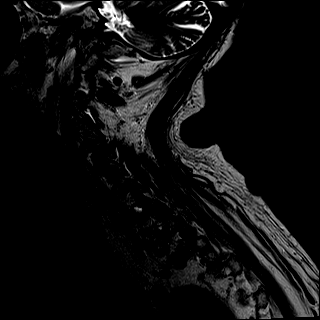

[Series 6: T1 · sagittal · 3.0mm · 0.69mm/px · 3 of 12 slices shown (1 of 2)]
[im 1/12]
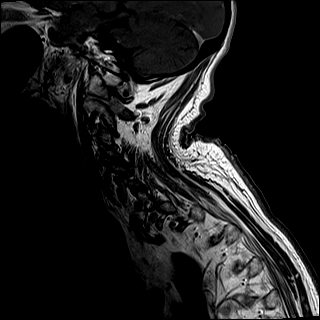
[im 6/12]
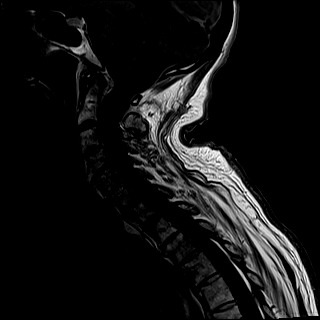
[im 12/12]
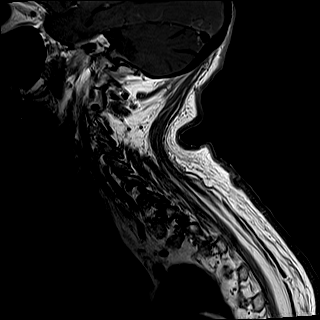

[Series 7: STIR · sagittal · 3.0mm · 0.86mm/px · 1 of 12 slices shown]
[im 1/12]
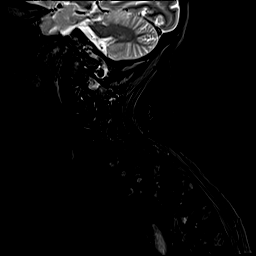

[Series 8: T2 · axial · 3.0mm · 0.66mm/px · z∈[-130,-33]mm · 9 of 36 slices shown (2 of 2)]
[im 1/36]
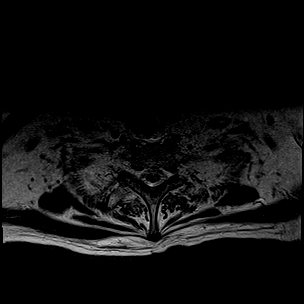
[im 5/36]
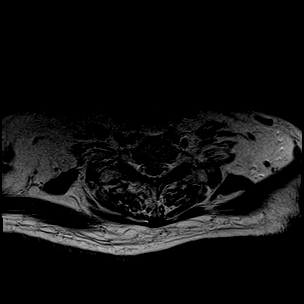
[im 9/36]
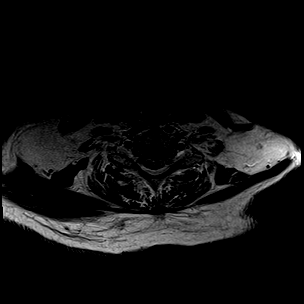
[im 14/36]
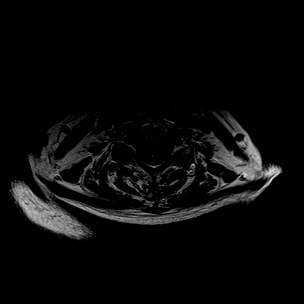
[im 18/36]
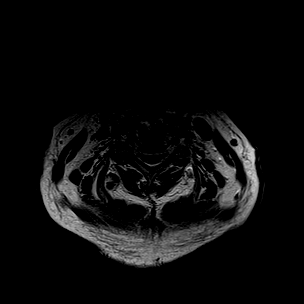
[im 22/36]
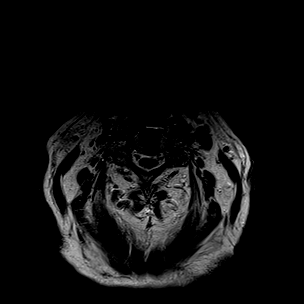
[im 27/36]
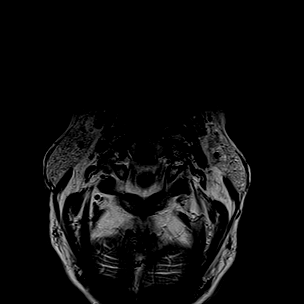
[im 31/36]
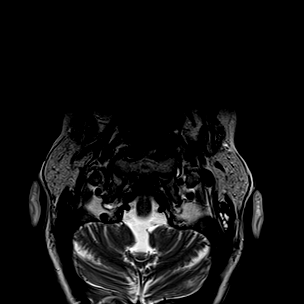
[im 36/36]
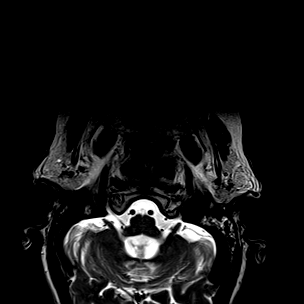

[Series 32: T1 · axial · 3.0mm · 0.39mm/px · z∈[-130,-33]mm · 9 of 36 slices shown (2 of 2)]
[im 1/36]
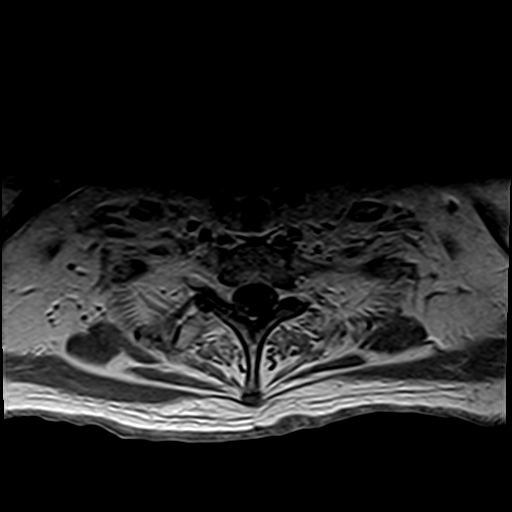
[im 5/36]
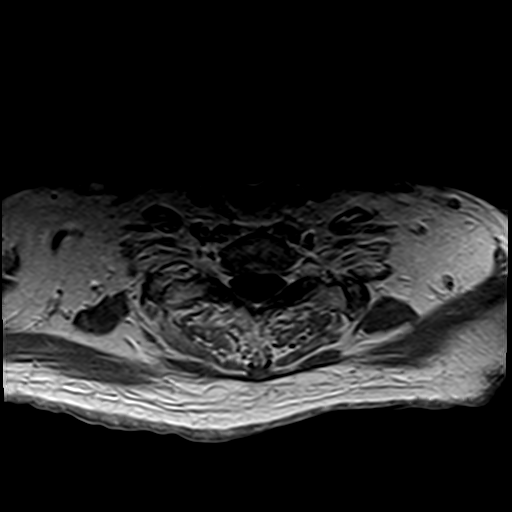
[im 9/36]
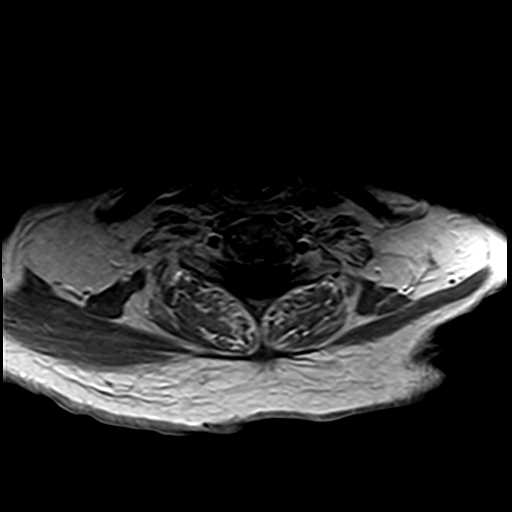
[im 14/36]
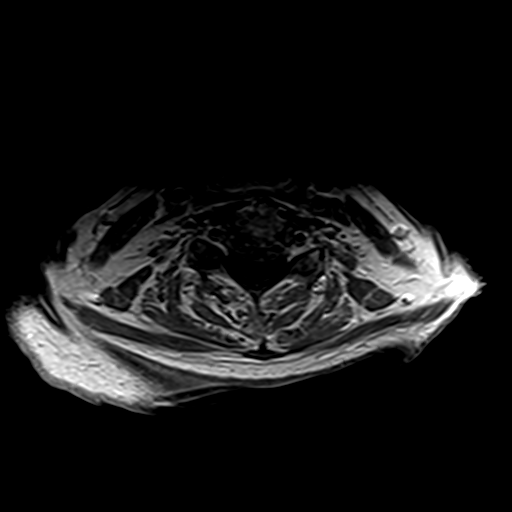
[im 18/36]
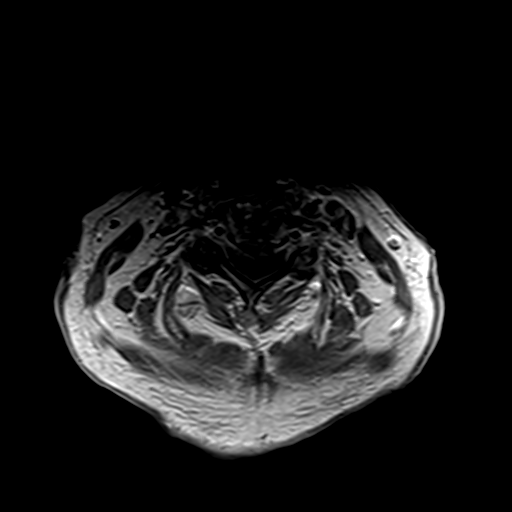
[im 22/36]
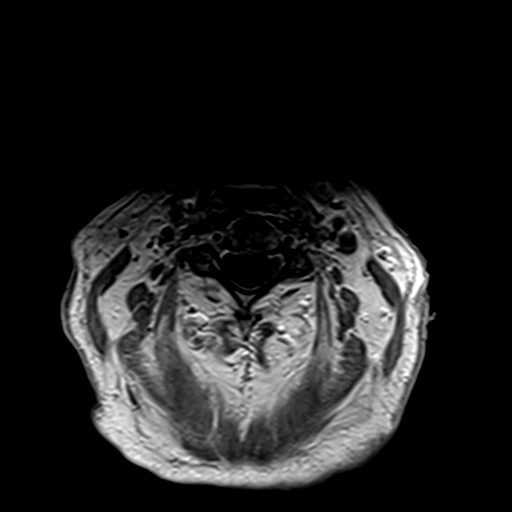
[im 27/36]
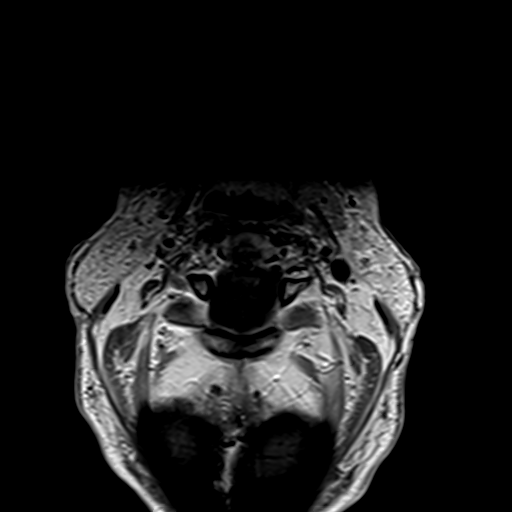
[im 31/36]
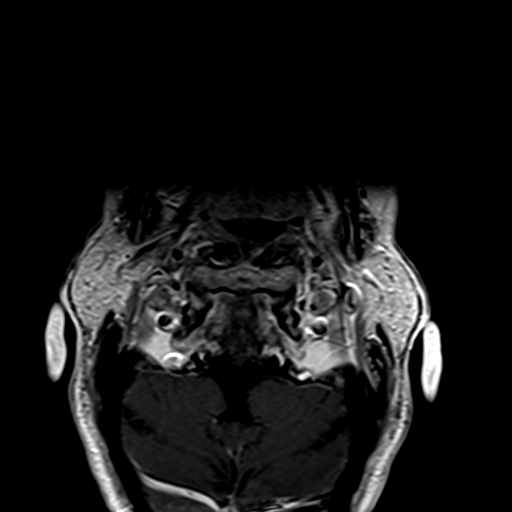
[im 36/36]
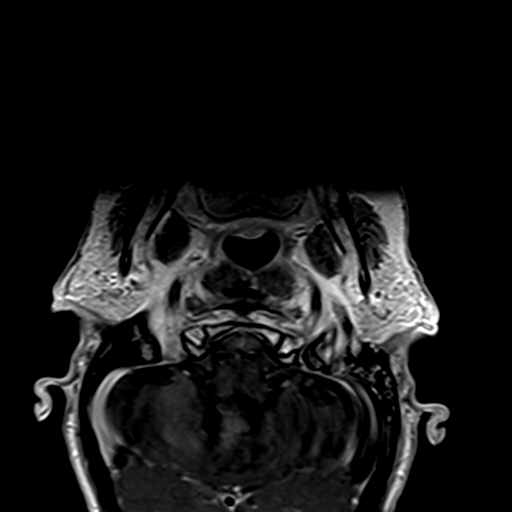

[Series 33: T1 fat-sat post-contrast · sagittal · 3.0mm · 0.35mm/px · 3 of 12 slices shown]
[im 1/12]
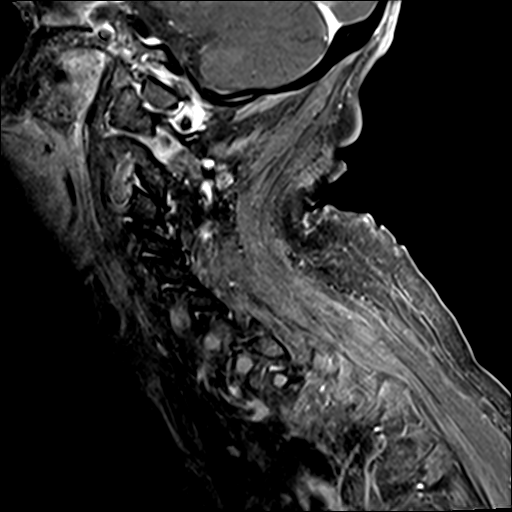
[im 6/12]
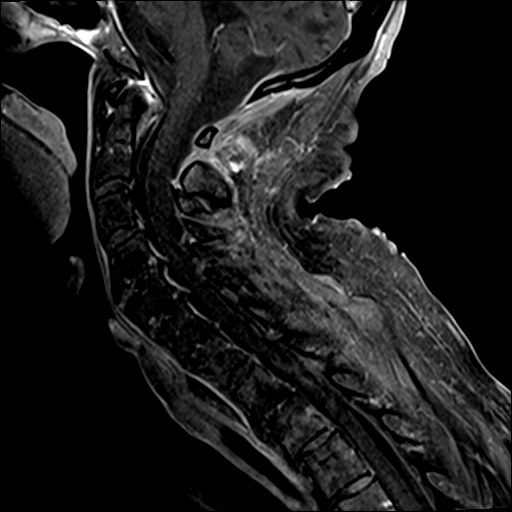
[im 12/12]
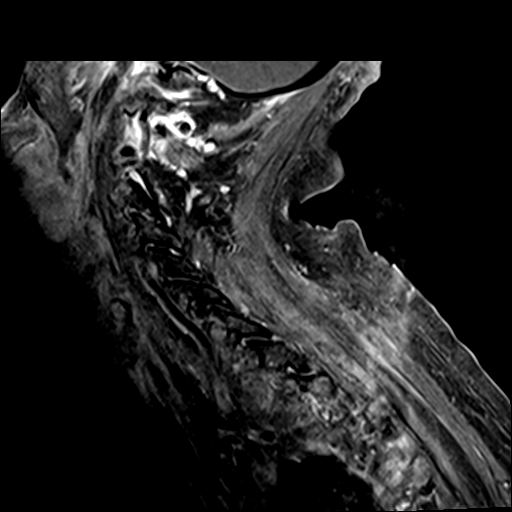

[28 of 48 positions shown; findings below may reference images not displayed]

FINDINGS: Mildly motion degraded exam.

Alignment: Straightening of the expected cervical lordosis. No
significant spondylolisthesis.

Vertebrae: Vertebral body height is maintained. Multilevel
degenerative endplate irregularity. No significant marrow edema or
focal suspicious osseous lesion.

Cord: No signal abnormality identified within the cervical spinal
cord.

Posterior Fossa, vertebral arteries, paraspinal tissues: Cerebellar
atrophy. Flow voids preserved within the imaged cervical vertebral
arteries. Paraspinal soft tissues unremarkable. Bilateral mastoid
effusions.

Disc levels:

Multilevel disc degeneration. Most notably, there is advanced disc
degeneration at C4-C5, C5-C6, C6-C7 and T1-T2.

C2-C3: Shallow disc bulge with bilateral uncovertebral hypertrophy.
No significant spinal canal or foraminal stenosis.

C3-C4: Shallow disc bulge with bilateral uncovertebral hypertrophy.
Facet arthrosis and ligamentum flavum hypertrophy. Mild relative
spinal canal narrowing (without spinal cord mass effect). Bilateral
neural foraminal narrowing (mild right, moderate left).

C4-C5: Posterior disc osteophyte complex with bilateral disc
osteophyte ridge/uncinate hypertrophy. Facet arthrosis and
ligamentum flavum hypertrophy. Mild relative spinal canal narrowing
(without spinal cord mass effect). Bilateral neural foraminal
narrowing (moderate/severe right, mild left).

C5-C6: Posterior disc osteophyte complex with bilateral disc
osteophyte ridge/uncinate hypertrophy. Facet arthrosis and
ligamentum flavum hypertrophy. Mild/moderate spinal canal stenosis
with contact upon the dorsal and ventral spinal cord. Bilateral
neural foraminal narrowing (moderate/severe right, mild left).

C6-C7: Disc bulge with bilateral disc osteophyte ridge/uncinate
hypertrophy. Minimal partial effacement of the ventral thecal sac
(without spinal cord mass effect). Mild/moderate right neural
foraminal narrowing.

C7-T1: Shallow disc bulge. Minimal facet arthrosis. No significant
spinal canal or foraminal stenosis.
IMPRESSION: Mildly motion degraded exam.

Within the limitations of motion degradation, no spinal cord signal
abnormality is identified.

Cervical spondylosis, as outlined. Multilevel spinal canal stenosis.
Most notably, there is multifactorial mild/moderate spinal canal
stenosis at C5-C6. Multilevel foraminal stenosis, as detailed and
greatest on the left at C3-C4 (moderate), on the right at C4-C5
(moderate/severe), and on the right at C5-C6 (moderate/severe).

Disc degeneration is greatest (severe) at C4-C5, C5-C6, C6-C7 and
T1-T2.

## 2021-04-11 MED ORDER — FUROSEMIDE 40 MG PO TABS
40.0000 mg | ORAL_TABLET | ORAL | Status: DC
  Administered 2021-04-11: 40 mg via ORAL
  Filled 2021-04-11: qty 2

## 2021-04-11 MED ORDER — LEVOTHYROXINE SODIUM 50 MCG PO TABS
50.0000 ug | ORAL_TABLET | Freq: Every day | ORAL | Status: DC
  Administered 2021-04-11 – 2021-04-13 (×3): 50 ug via ORAL
  Filled 2021-04-11 (×2): qty 1
  Filled 2021-04-11: qty 2

## 2021-04-11 MED ORDER — ACETAMINOPHEN 650 MG RE SUPP: 650.0000 mg | Freq: Four times a day (QID) | RECTAL | Status: DC | PRN

## 2021-04-11 MED ORDER — SODIUM CHLORIDE 0.9 % IV BOLUS
500.0000 mL | Freq: Once | INTRAVENOUS | Status: AC
  Administered 2021-04-11: 500 mL via INTRAVENOUS

## 2021-04-11 MED ORDER — IOHEXOL 9 MG/ML PO SOLN
ORAL | Status: AC
  Filled 2021-04-11: qty 1000

## 2021-04-11 MED ORDER — GADOBUTROL 1 MMOL/ML IV SOLN
7.0000 mL | Freq: Once | INTRAVENOUS | Status: AC | PRN
  Administered 2021-04-11: 7 mL via INTRAVENOUS

## 2021-04-11 MED ORDER — GADOBUTROL 1 MMOL/ML IV SOLN
6.0000 mL | Freq: Once | INTRAVENOUS | Status: AC | PRN
  Administered 2021-04-11: 6 mL via INTRAVENOUS

## 2021-04-11 MED ORDER — AMLODIPINE BESYLATE 5 MG PO TABS
5.0000 mg | ORAL_TABLET | Freq: Every day | ORAL | Status: DC
  Administered 2021-04-11 – 2021-04-17 (×6): 5 mg via ORAL
  Filled 2021-04-11 (×7): qty 1

## 2021-04-11 MED ORDER — METOPROLOL SUCCINATE ER 50 MG PO TB24
50.0000 mg | ORAL_TABLET | Freq: Two times a day (BID) | ORAL | Status: DC
  Administered 2021-04-11 – 2021-04-17 (×12): 50 mg via ORAL
  Filled 2021-04-11 (×5): qty 1
  Filled 2021-04-11: qty 2
  Filled 2021-04-11 (×7): qty 1

## 2021-04-11 MED ORDER — LISINOPRIL 2.5 MG PO TABS
5.0000 mg | ORAL_TABLET | Freq: Every day | ORAL | Status: DC
  Administered 2021-04-11: 5 mg via ORAL
  Filled 2021-04-11: qty 1

## 2021-04-11 MED ORDER — INSULIN ASPART 100 UNIT/ML IJ SOLN
0.0000 [IU] | Freq: Three times a day (TID) | INTRAMUSCULAR | Status: DC
  Administered 2021-04-11 – 2021-04-12 (×3): 1 [IU] via SUBCUTANEOUS

## 2021-04-11 MED ORDER — ACETAMINOPHEN 325 MG PO TABS: 650.0000 mg | ORAL_TABLET | Freq: Four times a day (QID) | ORAL | Status: DC | PRN

## 2021-04-11 NOTE — H&P (Signed)
History and Physical    PLEASE NOTE THAT DRAGON DICTATION SOFTWARE WAS USED IN THE CONSTRUCTION OF THIS NOTE.   [name] NKN:[id number] DOB: 06/27/1928 DOA: 04/10/2021  PCP: Reynold Bowen, MD Patient coming from: home   I have personally briefly reviewed patient's old medical records in [place]  Chief Complaint: Bilateral lower extremity weakness  HPI: Catherine Mason is a 87 y.o. female with medical history significant for type 2 diabetes mellitus, stage IIIb chronic kidney disease with baseline creatinine 1.6-1.8, hypertension, chronic diastolic heart failure, who is admitted to [facility] on 04/10/2021 with bilateral lower extremity weakness after presenting to [facility] ED via ED to ED transfer from [place] ED, have presenting to the latter facility complaining of bilateral lower extremity weakness.   The patient reports progressive bilateral lower extremity weakness over the course of the last 4 to 6 weeks.  She reports that this is been associated with increased difficulty in flexion of the left foot over that time.  She also reports associated numbness involving the bilateral lower extremities, worse on the left than the right, although she is unable to convey a specific distribution of numbness within the lower extremities, stating that the numbness is "all over legs".  She lives independently, and at baseline ambulates with the aid of a walker, conveying that she does not require any assistance with transfers and is able to independently perform her ADLs.  In the setting of progressive bilateral lower extremity weakness over the last 4 to 6 weeks, she notes at while she is still able to ambulate with the aid of her walker, that it has become progressively more difficult to do so, also complicating her ability to safely perform ADLs over that timeframe.  She notes a ground-level mechanical fall in December 2021, representing her most recent prior trauma, fall, injury.  She  underwent ensuing outpatient physical therapy over the next few months, which ended in April 2022.  She feels that this completed session of physical therapy was geared more towards some generalized weakness that she was experiencing at the time, as opposed to any objective weakness involving the bilateral lower extremities that she has been experiencing over the last 4 to 6 weeks on a progressive basis.  She denies any acute or subacute weakness involving the upper extremities.  Denies any recent headache, neck stiffness.  Not associate with any acute facial droop, slurred speech, expressive aphasia, acute change in vision, dysphagia, vertigo.  Denies any associated fecal incontinence or urinary retention.   Denies any recent chest pain, shortness of breath, palpitations, diaphoresis, nausea, vomiting.  No recent abdominal pain, diarrhea, melena, or hematochezia.  No associated with any recent subjective fever, chills, rigors, generalized myalgias or rash.  In the setting of further progression of her weakness in the bilateral lower extremities, the patient presented to [facility] emergency department on 04/10/2021, where she was evaluated by [facility] neurolog[facility].  Dr. Rory Percy Recommended further evaluation with MRI brain as well as MRI of the lumbar spine.  However, as any pen did not have the ability to perform the scans at the time, she was transferred via ED to ED transfer to [facility] ED for further evaluation and management of presenting progressive bilateral lower extremity weakness, including pursuit of neurology recommended MRI brain as well as MRI of the lumbar spine.   ED Course:  Vital signs in the ED were notable for the following: Afebrile; heart rate 64-70; blood pressure 141/62; respiratory rate  15-19, oxygen saturation 99 to 100% on room air  Labs were notable for the following: CMP notable for the following: Chloride 112, bicarbonate 18, anion gap 8, creatinine 1.80 relative to baseline  creatinine of 1.6-1.8, and liver enzymes were found to be within normal limits.  CBC notable for white blood cell count 6800.  INR 1.0.  Urinalysis has been ordered, with result currently pending.  Screening COVID-19 PCR was performed in the ED today and found to be negative.  Imaging and additional notable ED work-up: EKG showed sinus rhythm with heart rate 66, normal intervals, no evidence of T wave or ST changes, including no evidence of ST elevation.  MRI of the brain showed no evidence of acute intracranial process, including no evidence of acute ischemic infarct.  MRI of the lumbar spine showed no evidence of significant central canal stenosis while showing multilevel moderate to severe facet arthrosis, worst at right L4/L5 and L5/S1, along with mild neural foraminal stenosis at the left L1/L2, left L2/L3, right L4/L5, and right L5/S1.   EDP consulted the on-call neurologist at [facility], Dr. Cheral Marker, who will formally consult and evaluate the patient, with additional recommendations pending at this time.     Review of Systems: As per HPI otherwise 10 point review of systems negative.   Past Medical History:  Diagnosis Date   Abdominal wall hernia at previous stoma site    Anemia    CAD (coronary artery disease)    PCI 2003 2.75x13, 2.75x28 Zeta stents mid RCA   CKD (chronic kidney disease), stage III (HCC)    Diabetes mellitus    Diverticular disease    HTN (hypertension)    Hyperlipidemia    Hypothyroid    NSTEMI (non-ST elevated myocardial infarction) ([place]) 7/14   DES LAD   SVT (supraventricular tachycardia) (HCC)    Thyroid disease    Uterine cancer ([place])    Ventral hernia     Past Surgical History:  Procedure Laterality Date   ABDOMINAL HYSTERECTOMY     COLON SURGERY     colectomy   colostomy reversal.     coronary stents     LEFT HEART CATHETERIZATION WITH CORONARY ANGIOGRAM N/A 01/14/2013   Procedure: LEFT HEART CATHETERIZATION WITH CORONARY ANGIOGRAM;  Surgeon:  Sherren Mocha, MD;  Location: [facility] CATH LAB;  Service: Cardiovascular;  Laterality: N/A;   PERCUTANEOUS CORONARY STENT INTERVENTION (PCI-S)  01/14/2013   Procedure: PERCUTANEOUS CORONARY STENT INTERVENTION (PCI-S);  Surgeon: Sherren Mocha, MD;  Location: [facility] CATH LAB;  Service: Cardiovascular;;    Social History:  reports that she has never smoked. She has never used smokeless tobacco. She reports that she does not drink alcohol and does not use drugs.   Allergies  Allergen Reactions   Shellfish Allergy Diarrhea    "SCALLOPS"    Family History  Problem Relation Age of Onset   Heart disease Mother        heart attack   Cancer Father        prostate   Diabetes Father     Family history reviewed and not pertinent    Prior to Admission medications   Medication Sig Start Date End Date Taking? Authorizing Provider  amLODipine (NORVASC) 5 MG tablet Take 1 tablet (5 mg total) by mouth daily. NEED OV. 03/11/21  Yes Martinique, Peter M, MD  aspirin EC 81 MG tablet Take 81 mg by mouth daily.   Yes [provider]  BIOTIN PO Take 1 tablet by mouth daily.  Yes [provider]  furosemide (LASIX) 40 MG tablet TAKE 1 TABLET BY MOUTH EVERY DAY 08/18/20  Yes Martinique, Peter M, MD  glimepiride (AMARYL) 2 MG tablet Take 2 mg by mouth daily. 01/12/21  Yes [provider]  insulin glargine, 2 Unit Dial, (TOUJEO MAX) 300 UNIT/ML Solostar Pen Inject 10 Units into the skin daily as needed (sugar). 09/12/19  Yes Martinique, Peter M, MD  lactobacillus acidophilus (BACID) TABS tablet Take 2 tablets by mouth daily.   Yes [provider]  levothyroxine (SYNTHROID, LEVOTHROID) 100 MCG tablet Take 50 mcg by mouth daily before breakfast.   Yes [provider]  lisinopril (PRINIVIL,ZESTRIL) 5 MG tablet Take 1 tablet by mouth daily. 06/10/16  Yes [provider]  LUMIGAN 0.01 % SOLN Place 1 drop into both eyes as directed. 07/16/18  Yes [provider]  metoprolol  succinate (TOPROL-XL) 50 MG 24 hr tablet Take 50 mg by mouth 2 (two) times daily. Take with or immediately following a meal.   Yes [provider]  Vitamin D, Ergocalciferol, (DRISDOL) 50000 units CAPS capsule Take 50,000 Units by mouth every 7 (seven) days.   Yes [provider]  metoprolol tartrate (LOPRESSOR) 50 MG tablet Take 50 mg by mouth 2 (two) times daily. Patient not taking: Reported on 04/10/2021 01/28/21   [provider]  NOVOFINE 32G X 6 MM MISC Use as directed 11/22/14   [provider]  ONE TOUCH ULTRA TEST test strip 1 strip by Other route daily. Use 1 strip to check glucose once daily 11/24/14   [provider]  pravastatin (PRAVACHOL) 40 MG tablet TAKE 1 TABLET BY MOUTH EVERY DAY IN THE EVENING Patient not taking: Reported on 04/10/2021 01/29/18   Martinique, Peter M, MD  Tafluprost (ZIOPTAN OP) Apply 1 drop to eye daily. Patient not taking: Reported on 04/10/2021    [provider]  timolol (TIMOPTIC) 0.25 % ophthalmic solution 1 drop 2 (two) times daily. Patient not taking: Reported on 04/10/2021    [provider]     Objective    Physical Exam: Vitals:   04/10/21 1648 04/10/21 1700 04/10/21 1839 04/10/21 2254  BP: (!) 142/54 140/60 (!) 191/83 (!) 159/64  Pulse: 64 64 95 60  Resp: 15 15 16 16   Temp:   98.5 F (36.9 C)   TempSrc:   Oral   SpO2: 99% 99% 100% 99%    General: appears to be stated age; alert, oriented Skin: warm, dry, no rash Head:  AT/[place] Mouth:  Oral mucosa membranes appear moist, normal dentition Neck: supple; trachea midline Heart:  RRR; did not appreciate any M/R/G Lungs: CTAB, did not appreciate any wheezes, rales, or rhonchi Abdomen: + BS; soft, ND, NT Vascular: 2+ pedal pulses b/l; 2+ radial pulses b/l Extremities: no peripheral edema, no muscle wasting Neuro: 4/5 strength of the proximal and distal flexors and extensors of the upper extremities bilaterally, 3/5 strength of bilateral  lower extremities, with left foot drop; diminished sensation to light touch in the bilateral lower extremities, left greater than right ; cranial nerves II through XII grossly intact; no evidence suggestive of slurred speech, dysarthria, or facial droop; Normal muscle tone. No tremors.    Labs on Admission: I have personally reviewed following labs and imaging studies  CBC: Recent Labs  Lab 04/10/21 1513 04/10/21 1558  WBC  --  6.8  NEUTROABS  --  3.6  HGB 13.6 12.3  HCT 40.0 40.0  MCV  --  98.8  PLT  --  540   Basic Metabolic Panel: Recent Labs  Lab 04/10/21 1457 04/10/21 1513  NA 138 142  K 5.0 4.8  CL 112* 113*  CO2 18*  --   GLUCOSE 159* 158*  BUN 33* 31*  CREATININE 1.80* 1.80*  CALCIUM 8.7*  --    GFR: CrCl cannot be calculated (Unknown ideal weight.). Liver Function Tests: Recent Labs  Lab 04/10/21 1457  AST 25  ALT 23  ALKPHOS 156*  BILITOT 0.9  PROT 7.4  ALBUMIN 3.8   No results for input(s): LIPASE, AMYLASE in the last 168 hours. No results for input(s): AMMONIA in the last 168 hours. Coagulation Profile: Recent Labs  Lab 04/10/21 1457  INR 1.0   Cardiac Enzymes: No results for input(s): CKTOTAL, CKMB, CKMBINDEX, TROPONINI in the last 168 hours. BNP (last 3 results) No results for input(s): PROBNP in the last 8760 hours. HbA1C: No results for input(s): HGBA1C in the last 72 hours. CBG: No results for input(s): GLUCAP in the last 168 hours. Lipid Profile: No results for input(s): CHOL, HDL, LDLCALC, TRIG, CHOLHDL, LDLDIRECT in the last 72 hours. Thyroid Function Tests: No results for input(s): TSH, T4TOTAL, FREET4, T3FREE, THYROIDAB in the last 72 hours. Anemia Panel: No results for input(s): VITAMINB12, FOLATE, FERRITIN, TIBC, IRON, RETICCTPCT in the last 72 hours. Urine analysis:    Component Value Date/Time   COLORURINE YELLOW 07/30/2010 1830   APPEARANCEUR HAZY (A) 07/30/2010 1830   LABSPEC 1.007 07/30/2010 1830   PHURINE 7.0  07/30/2010 1830   GLUCOSEU NEGATIVE 11/23/2009 2025   HGBUR SMALL (A) 07/30/2010 1830   BILIRUBINUR NEGATIVE 07/30/2010 1830   KETONESUR NEGATIVE 07/30/2010 1830   PROTEINUR NEGATIVE 07/30/2010 1830   UROBILINOGEN 0.2 07/30/2010 1830   NITRITE NEGATIVE 07/30/2010 1830   LEUKOCYTESUR LARGE (A) 07/30/2010 1830    Radiological Exams on Admission: MR BRAIN W WO CONTRAST  Result Date: 04/11/2021 CLINICAL DATA:  Acute neurologic deficit EXAM: MRI HEAD WITHOUT AND WITH CONTRAST TECHNIQUE: Multiplanar, multiecho pulse sequences of the brain and surrounding structures were obtained without and with intravenous contrast. CONTRAST:  23mL GADAVIST GADOBUTROL 1 MMOL/ML IV SOLN, 16mL GADAVIST GADOBUTROL 1 MMOL/ML IV SOLN COMPARISON:  None. FINDINGS: Brain: No acute infarct, mass effect or extra-axial collection. No acute or chronic hemorrhage. There is multifocal hyperintense T2-weighted signal within the white matter. Generalized volume loss without a clear lobar predilection. The midline structures are normal. Vascular: Major flow voids are preserved. Skull and upper cervical spine: Normal calvarium and skull base. Visualized upper cervical spine and soft tissues are normal. Sinuses/Orbits:No paranasal sinus fluid levels or advanced mucosal thickening. No mastoid or middle ear effusion. Normal orbits. IMPRESSION: 1. No acute intracranial abnormality. 2. Findings of chronic microvascular disease and mild volume loss. Electronically Signed   By: Ulyses Jarred M.D.   On: 04/11/2021 00:44   MR Lumbar Spine W Wo Contrast  Result Date: 04/11/2021 CLINICAL DATA:  Motor neuron disease EXAM: MRI LUMBAR SPINE WITHOUT AND WITH CONTRAST TECHNIQUE: Multiplanar and multiecho pulse sequences of the lumbar spine were obtained without and with intravenous contrast. CONTRAST:  44mL GADAVIST GADOBUTROL 1 MMOL/ML IV SOLN COMPARISON:  None. FINDINGS: Segmentation:  Standard Alignment:  Dextroscoliosis with apex at L3 Vertebrae:  No  acute abnormality. Conus medullaris and cauda equina: Conus extends to the L2 level. Conus and cauda equina appear normal. No abnormal contrast enhancement. Paraspinal and other soft tissues: Fatty atrophy of the paraspinous musculature. Disc levels: T12-L1: Left asymmetric disc bulge.  No stenosis. L1-L2: Left asymmetric disc bulge and mild facet hypertrophy. No spinal canal stenosis. Mild left neural foraminal stenosis. L2-L3: Moderate facet hypertrophy with small disc bulge. No spinal canal stenosis. Mild left neural foraminal stenosis. L3-L4: Small disc bulge with mild facet hypertrophy. No spinal canal stenosis. No neural foraminal stenosis. L4-L5: Small disc bulge with severe right and mild left facet hypertrophy. No spinal canal stenosis. Mild right neural foraminal stenosis. L5-S1: Severe right and mild left facet hypertrophy. Small right asymmetric disc bulge. No spinal canal stenosis. Mild right neural foraminal stenosis. Visualized sacrum: Normal. IMPRESSION: 1. No acute abnormality of the lumbar spine. 2. Dextroscoliosis with apex at L3. 3. Multilevel moderate to severe facet arthrosis, worst at right L4-5 and L5-S1. 4. Mild neural foraminal stenosis at left L1-L2, left L2-L3, right L4-L5 and right L5-S1. Electronically Signed   By: Ulyses Jarred M.D.   On: 04/11/2021 00:51     EKG: Independently reviewed, with result as described above.    Assessment/Plan   Catherine Mason is a 87 y.o. female with medical history significant for type 2 diabetes mellitus, stage IIIb chronic kidney disease with baseline creatinine 1.6-1.8, hypertension, chronic diastolic heart failure, who is admitted to [facility] on 04/10/2021 with bilateral lower extremity weakness after presenting to [facility] ED via ED to ED transfer from [place] ED, have presenting to the latter facility complaining of bilateral lower extremity weakness.    Principal Problem:   Lower extremity weakness Active Problems:   HTN  (hypertension)   CKD (chronic kidney disease), stage III (HCC)   Hypothyroid   Chronic diastolic CHF (congestive heart failure) (HCC)   RTA (renal tubular acidosis)   DM2 (diabetes mellitus, type 2) ([place])     #) Subacute bilateral lower extremity weakness: The patient presents complaining of 4 to 6 weeks of progressive weakness involving the bilateral lower extremities, with objective weakness identified on physical exam, and associated with sensory deficit in the bilateral lower extremities, left greater than right.  No overt evidence of red flag symptoms, including no recent fecal incontinence or urinary retention.  No overt evidence of saddle anesthesia.  While MRI brain showed no evidence of acute process, including no evidence of acute infarct, MRI of the lumbar spine showed no evidence of significant central canal stenosis, but rather showed evidence of multilevel neural foraminal stenosis, increasing risk for potential contributory nerve root compression from both the right and left contributing to her report of bilateral distribution of this weakness/diminished sensation. Dr. Cheral Marker of neurology has been consulted, and evaluated the patient, with additional recs pending at this time.   Plan: Neurology consulted, as above.  Fall precautions ordered.  As needed acetaminophen.  Physical therapy consult ordered for the morning.      #) non- anion gap metabolic acidosis: Associated with type for chloremia, no suggestive of renal tubular acidosis, of unclear etiology.   Plan: Check urinalysis.  Repeat BMP in the morning.  Monitor strict I's and O's and daily weights.      #) Stage IIIb chronic kidney disease: Documented history of such, with associated baseline creatinine range of 1.6-1.8, with presenting labs consistent with this baseline range.  Plan: Monitor strict I's and O's and daily weights.  Tempt avoid nephrotoxic agents.  Repeat BMP in the morning.      #) Type 2  diabetes mellitus: Documented history of such, on glimepiride as sole outpatient oral hypoglycemic agent.  Not on any insulin at home.  Presenting blood  sugar noted to be 159.  Plan: Hold home glimepiride during this hospitalization.  Accu-Cheks before every meal and at bedtime with low-dose sliding scale insulin has been ordered.       #) Essential hypertension: Documented history of such, on Norvasc lisinopril, Toprol-XL.  Systolic blood pressures in the ED thus far been in the 140s to 150s, without concomitant evidence of hypotension.  Plan: Continue home antihypertensive regimen close monitoring of ensuing optionally routine vital signs.     #) Acquired hypothyroidism: Documented history of such, on Synthroid as an outpatient.   Plan: Continue home Synthroid.       #) Chronic diastolic heart failure: documented history of such, with most recent echocardiogram performed in July 2016 notable for LVEF 55 to 60%, no focal wall motion abnormalities, grade 1 diastolic dysfunction, and no significant valvular pathology mild. No clinical evidence to suggest acutely decompensated heart failure at this time. Patient's home diuretic regimen reportedly consists of the following: Lasix 40 mg p.o. every 48 hours.    Plan: monitor strict I's & O's and daily weights. Repeat BMP in the morning. Check serum magnesium level. Continue home diuretic regimen.       DVT prophylaxis: SCDs Code Status: Full code Family Communication: Case was discussed with the patient's grandson, who is present at bedside Disposition Plan: Per Rounding Team Consults called: Dr. Cheral Marker of neurology has been consulted, as further detailed above;   Admission status:    Of note, this patient was added by me to the following Admit List/Treatment Team:  mcadmits.   Of note, the Adult Admission Order Set (Multimorbid order set) was used by me in the admission process for this patient.  PLEASE NOTE THAT DRAGON  DICTATION SOFTWARE WAS USED IN THE CONSTRUCTION OF THIS NOTE.   Rhetta Mura DO [facility] Pager [phone] From [place]   04/11/2021, 1:47 AM

## 2021-04-11 NOTE — Plan of Care (Signed)
S:  Briefly visited patient to confirm her exam is stable from Dr. Yvetta Coder exam earlier this morning. Family at bedside reports her choking episode at dinner is not unusual for her and in the last 2 to 3 months she has had occasional dysphagia.   O:  Exam stable generally, mild variation possibly secondary to examiner: She is appropriately interactive, follows commands, speech is fluent for casual conversation and she does not seem to be confused Agree that overall her upper extremities are 4/5 though slightly distally than proximally for me.   Agree that she is 2/5 hip flexion bilaterally, but knee extension is closer to 4/5, with knee flexion 3/5, ankle dorsiflexion 4/5 on the left and 3/5 on the right, plantar flexion 5/5 bilaterally.   Reflexes 3+ at the brachioradialis, 2+ patellae, absent at the Achilles  CK is reassuring at 36, CRP is minimally elevated at 1.0, A1c is above goal at 9.0%, liver function tests are reassuring  MRI cervical and thoracic spine personally reviewed, and discussed with family: We discussed that there are degenerative changes of her cervical and thoracic spine, without evidence of acute cord compression, and that her statin has been held at this time, and that CT chest/abdomen/pelvis without contrast will be performed to rule out malignancy pending scanner availability given volume of emergencies at this time.  Plan for lumbar puncture on 10/10 discussed with family  Lesleigh Noe MD-PhD [facility] [phone]  Available 7 AM to 7 PM, outside these hours please contact Neurologist on call listed on AMION

## 2021-04-11 NOTE — ED Notes (Signed)
Patient transported to MRI 

## 2021-04-11 NOTE — Progress Notes (Signed)
Brief note: -Patient was admitted earlier today. -As per HPI done on admission by Babs Bertin, DO: " Catherine Mason is a 87 y.o. female with medical history significant for type 2 diabetes mellitus, stage IIIb chronic kidney disease with baseline creatinine 1.6-1.8, hypertension, chronic diastolic heart failure, who is admitted to [facility] on 04/10/2021 with bilateral lower extremity weakness after presenting to [facility] ED via ED to ED transfer from [facility] ED, have presenting to the latter facility complaining of bilateral lower extremity weakness.    The patient reports progressive bilateral lower extremity weakness over the course of the last 4 to 6 weeks.  She reports that this is been associated with increased difficulty in flexion of the left foot over that time.  She also reports associated numbness involving the bilateral lower extremities, worse on the left than the right, although she is unable to convey a specific distribution of numbness within the lower extremities, stating that the numbness is "all over legs".  She lives independently, and at baseline ambulates with the aid of a walker, conveying that she does not require any assistance with transfers and is able to independently perform her ADLs.  In the setting of progressive bilateral lower extremity weakness over the last 4 to 6 weeks, she notes at while she is still able to ambulate with the aid of her walker, that it has become progressively more difficult to do so, also complicating her ability to safely perform ADLs over that timeframe.  She notes a ground-level mechanical fall in December 2021, representing her most recent prior trauma, fall, injury.  She underwent ensuing outpatient physical therapy over the next few months, which ended in April 2022.  She feels that this completed session of physical therapy was geared more towards some generalized weakness that she was experiencing at the time, as opposed to any objective weakness  involving the bilateral lower extremities that she has been experiencing over the last 4 to 6 weeks on a progressive basis.  She denies any acute or subacute weakness involving the upper extremities.  Denies any recent headache, neck stiffness.  Not associate with any acute facial droop, slurred speech, expressive aphasia, acute change in vision, dysphagia, vertigo.  Denies any associated fecal incontinence or urinary retention.    Denies any recent chest pain, shortness of breath, palpitations, diaphoresis, nausea, vomiting.  No recent abdominal pain, diarrhea, melena, or hematochezia.  No associated with any recent subjective fever, chills, rigors, generalized myalgias or rash.   In the setting of further progression of her weakness in the bilateral lower extremities, the patient presented to [facility] emergency department on 04/10/2021, where she was evaluated by [place] neurology.  Dr. Rory Percy Recommended further evaluation with MRI brain as well as MRI of the lumbar spine.  However, as any pen did not have the ability to perform the scans at the time, she was transferred via ED to ED transfer to [facility], ED for further evaluation and management of presenting progressive bilateral lower extremity weakness, including pursuit of neurology recommended MRI brain as well as MRI of the lumbar spine.     ED Course:  Vital signs in the ED were notable for the following: Afebrile; heart rate 64-70; blood pressure 141/62; respiratory rate 15-19, oxygen saturation 99 to 100% on room air   Labs were notable for the following: CMP notable for the following: Chloride 112, bicarbonate 18, anion gap 8, creatinine 1.80 relative to baseline creatinine of 1.6-1.8, and liver enzymes  were found to be within normal limits.  CBC notable for white blood cell count 6800.  INR 1.0.  Urinalysis has been ordered, with result currently pending.  Screening COVID-19 PCR was performed in the ED today and found to be negative.    Imaging and additional notable ED work-up: EKG showed sinus rhythm with heart rate 66, normal intervals, no evidence of T wave or ST changes, including no evidence of ST elevation.  MRI of the brain showed no evidence of acute intracranial process, including no evidence of acute ischemic infarct.  MRI of the lumbar spine showed no evidence of significant central canal stenosis while showing multilevel moderate to severe facet arthrosis, worst at right L4/L5 and L5/S1, along with mild neural foraminal stenosis at the left L1/L2, left L2/L3, right L4/L5, and right L5/S1.    EDP consulted the on-call neurologist at [place], Dr. Lindzen, who will formally consult and evaluate the patient, with additional recommendations pending at this time".  Neurology team has been consulted.  Neurology is directing care. MRI thoracic/cervical and pelvis ordered.  CPK, IC ESR, CRP, A1c, liver function tests, ordered. CT chest, abdomen and pelvis also ordered.  Patient was seen alongside patient's grandson. Patient also gave 2-week history of diarrhea prior to presentation.  Power left lower extremity is 1 out of 5 on the left side and 0-1 out of 5 on the low side.  Further management will depend on hospital course.       

## 2021-04-11 NOTE — ED Notes (Signed)
Lunch ordered 

## 2021-04-11 NOTE — Progress Notes (Addendum)
MD notified of pt choking while eating her dinner. MD gave verbal order to remain NPO for the remainder of the day and to get Speech therapy to evaluate the pt in the morning. Spoke with pt and family at the bedside to explain that the pt should not have anything to eat or drink tonight due to high aspiration risk. The state that they understand. Call button placed within reach and bed alarm activated. Staff will continue to monitor.

## 2021-04-11 NOTE — Progress Notes (Signed)
New Admission Note:  Arrival Method: via stretcher from [facility] ED Mental Orientation: pt A&O x4 Telemetry: pt on telemetry monitor Assessment: Completed Skin: clean dry and intact IV: Rt wrist SL Pain:Pt has no c/o pain or s/s of pain or discomfort Safety Measures: Safety Fall Prevention Plan was given, discussed. Admission: Completed 3W: Patient has been orientated to the room, unit and the staff. Family: pt has a grandson at the bedside, that is supportive of pt and pleasant.   Orders have been reviewed and implemented. Will continue to monitor the patient. Call light has been placed within reach and bed alarm has been activated.   Janus Molder ,RN

## 2021-04-11 NOTE — Consult Note (Addendum)
NEURO HOSPITALIST CONSULT NOTE   Requestig physician: Dr. Velia Meyer  Reason for Consult: BLE weakness and progressively worsening gait x 1 week  History obtained from:  Patient, Son and Chart     HPI:                                                                                                                                          Catherine Mason is an 87 y.o. female with a PMHx of DM2, stage IIIb CKD with baseline creatinine 1.6-1.8, HTN, chronic diastolic heart failure, anemia, CAD s/p stenting, SVT, uterine cancer, HLD and hypothyroidism, who presented to the [facility] ED on Saturday afternoon with a chief complaint of BLE weakness. She had been ambulatory on Friday, but not able to get up to use the bathroom the next day. BP on arrival was 138/57, with HR 73, SpO2:99% and CBG 163. She was able to relate that while she had been getting up to use the bathroom, her LLE felt numb. She was able to get to the bathroom but had to crawl back to the bedroom because she wasn't able to put any weight on her left leg.   Her acute decompensation occurred in the context of progressive bilateral lower extremity weakness which has been ongoing over the last 4-6 weeks. Prior to this, her baseline was ambulating with a walker, independent with ADLs and no assistance required for transfers. She did have a fall in this context back in December of last year, which required several months of PT afterwards. Along with the diffusely increasing BLE weakness over the past weeks, she has had in particular an increasing difficulty flexing her left foot. She also has had sensory symptoms of numbness in her BLE, worse on the left relative to the right - the numbness is describes as "all over" her legs. She can still ambulate with her walker, but it has become gradually harder to do.   She denies new onset upper extremity weakness. No other neurological deficits, including no vision changes, facial droop,  speech deficit or headache. Also with no urinary retention or B/B incontinence.     Past Medical History:  Diagnosis Date   Abdominal wall hernia at previous stoma site    Anemia    CAD (coronary artery disease)    PCI 2003 2.75x13, 2.75x28 Zeta stents mid RCA   CKD (chronic kidney disease), stage III (HCC)    Diabetes mellitus    Diverticular disease    HTN (hypertension)    Hyperlipidemia    Hypothyroid    NSTEMI (non-ST elevated myocardial infarction) ([place]) 7/14   DES LAD   SVT (supraventricular tachycardia) (HCC)    Thyroid disease    Uterine cancer (HCC)    Ventral hernia  Past Surgical History:  Procedure Laterality Date   ABDOMINAL HYSTERECTOMY     COLON SURGERY     colectomy   colostomy reversal.     coronary stents     LEFT HEART CATHETERIZATION WITH CORONARY ANGIOGRAM N/A 01/14/2013   Procedure: LEFT HEART CATHETERIZATION WITH CORONARY ANGIOGRAM;  Surgeon: Sherren Mocha, MD;  Location: [facility] CATH LAB;  Service: Cardiovascular;  Laterality: N/A;   PERCUTANEOUS CORONARY STENT INTERVENTION (PCI-S)  01/14/2013   Procedure: PERCUTANEOUS CORONARY STENT INTERVENTION (PCI-S);  Surgeon: Sherren Mocha, MD;  Location: [facility] CATH LAB;  Service: Cardiovascular;;    Family History  Problem Relation Age of Onset   Heart disease Mother        heart attack   Cancer Father        prostate   Diabetes Father             Social History:  reports that she has never smoked. She has never used smokeless tobacco. She reports that she does not drink alcohol and does not use drugs.  Allergies  Allergen Reactions   Shellfish Allergy Diarrhea    "SCALLOPS"    MEDICATIONS:                                                                                                                      No current facility-administered medications on file prior to encounter.   Current Outpatient Medications on File Prior to Encounter  Medication Sig Dispense Refill   amLODipine (NORVASC) 5 MG  tablet Take 1 tablet (5 mg total) by mouth daily. NEED OV. 7 tablet 0   aspirin EC 81 MG tablet Take 81 mg by mouth daily.     BIOTIN PO Take 1 tablet by mouth daily.     furosemide (LASIX) 40 MG tablet TAKE 1 TABLET BY MOUTH EVERY DAY 90 tablet 2   glimepiride (AMARYL) 2 MG tablet Take 2 mg by mouth daily.     insulin glargine, 2 Unit Dial, (TOUJEO MAX) 300 UNIT/ML Solostar Pen Inject 10 Units into the skin daily as needed (sugar).     lactobacillus acidophilus (BACID) TABS tablet Take 2 tablets by mouth daily.     levothyroxine (SYNTHROID, LEVOTHROID) 100 MCG tablet Take 50 mcg by mouth daily before breakfast.     lisinopril (PRINIVIL,ZESTRIL) 5 MG tablet Take 1 tablet by mouth daily.     LUMIGAN 0.01 % SOLN Place 1 drop into both eyes as directed.     metoprolol succinate (TOPROL-XL) 50 MG 24 hr tablet Take 50 mg by mouth 2 (two) times daily. Take with or immediately following a meal.     Vitamin D, Ergocalciferol, (DRISDOL) 50000 units CAPS capsule Take 50,000 Units by mouth every 7 (seven) days.     metoprolol tartrate (LOPRESSOR) 50 MG tablet Take 50 mg by mouth 2 (two) times daily. (Patient not taking: Reported on 04/10/2021)     NOVOFINE 32G X 6 MM MISC Use as directed  [place]  ULTRA TEST test strip 1 strip by Other route daily. Use 1 strip to check glucose once daily  11   pravastatin (PRAVACHOL) 40 MG tablet TAKE 1 TABLET BY MOUTH EVERY DAY IN THE EVENING (Patient not taking: Reported on 04/10/2021) 90 tablet 3   Tafluprost (ZIOPTAN OP) Apply 1 drop to eye daily. (Patient not taking: Reported on 04/10/2021)     timolol (TIMOPTIC) 0.25 % ophthalmic solution 1 drop 2 (two) times daily. (Patient not taking: Reported on 04/10/2021)       ROS:                                                                                                                                       As per HPI.    Blood pressure (!) 159/64, pulse 60, temperature 98.5 F (36.9 C), temperature source Oral,  resp. rate 16, SpO2 99 %.   General Examination:                                                                                                       Physical Exam  HEENT-  [place]/AT    Lungs- Respirations unlabored Extremities- Warm and well perfused. No cyanosis or pallor. Mild edema distal legs bilaterally.   Neurological Examination Mental Status: Awake and alert. Speech fluent with intact naming and comprehension. Oriented x 5.  Cranial Nerves: II: Temporal visual fields intact with no extinction to DSS. PERRL.  III,IV, VI: EOMI with saccadic quality of pursuits noted. No ptosis. No nystagmus.  V: Temp sensation equal bilaterally.  VII: Smile symmetric. No bulbar weakness noted.  VIII: Mild HOH IX,X: No nasal speech, hoarseness or hypophonia XI: Symmetric shoulder shrug XII: Midline tongue extension Motor: BUE 4/5 proximally and distally without asymmetry RLE: 2/5 hip flexion, 2-3/5 knee extension and knee flexion, 3/5 ADF/APF LLE: 2/5 hip flexion, 3/5 knee extension and knee flexion, 3/5 ADF/APF Muscle wasting to thighs bilaterally is worse than atrophy of anterior and posterior muscles of the lower legs Sensory: BUE with normal temp, FT and vibration sensation BLE with proximal to distal gradient of FT and temp sensory loss, worse to the feet bilaterally. Moderate to severe loss of vibration sensation to toes bilaterally. Also with some loss of vibration sensation to ankles.  Deep Tendon Reflexes: 2+ bilateral brachioradialis and biceps. 1+ bilateral patellae. 0 bilateral achilles.  Plantars: Downgoing bilaterally  Cerebellar: No ataxia with FNF bilaterally. Unable to assess H-S due to BLE weakness.  Gait: Unable to assess  Lab Results: Basic Metabolic Panel: Recent Labs  Lab 04/10/21 1457 04/10/21 1513  NA 138 142  K 5.0 4.8  CL 112* 113*  CO2 18*  --   GLUCOSE 159* 158*  BUN 33* 31*  CREATININE 1.80* 1.80*  CALCIUM 8.7*  --     CBC: Recent Labs  Lab  04/10/21 1513 04/10/21 1558  WBC  --  6.8  NEUTROABS  --  3.6  HGB 13.6 12.3  HCT 40.0 40.0  MCV  --  98.8  PLT  --  153    Cardiac Enzymes: No results for input(s): CKTOTAL, CKMB, CKMBINDEX, TROPONINI in the last 168 hours.  Lipid Panel: No results for input(s): CHOL, TRIG, HDL, CHOLHDL, VLDL, LDLCALC in the last 168 hours.  Imaging: MR BRAIN W WO CONTRAST  Result Date: 04/11/2021 CLINICAL DATA:  Acute neurologic deficit EXAM: MRI HEAD WITHOUT AND WITH CONTRAST TECHNIQUE: Multiplanar, multiecho pulse sequences of the brain and surrounding structures were obtained without and with intravenous contrast. CONTRAST:  14m GADAVIST GADOBUTROL 1 MMOL/ML IV SOLN, 670mGADAVIST GADOBUTROL 1 MMOL/ML IV SOLN COMPARISON:  None. FINDINGS: Brain: No acute infarct, mass effect or extra-axial collection. No acute or chronic hemorrhage. There is multifocal hyperintense T2-weighted signal within the white matter. Generalized volume loss without a clear lobar predilection. The midline structures are normal. Vascular: Major flow voids are preserved. Skull and upper cervical spine: Normal calvarium and skull base. Visualized upper cervical spine and soft tissues are normal. Sinuses/Orbits:No paranasal sinus fluid levels or advanced mucosal thickening. No mastoid or middle ear effusion. Normal orbits. IMPRESSION: 1. No acute intracranial abnormality. 2. Findings of chronic microvascular disease and mild volume loss. Electronically Signed   By: KeUlyses Jarred.D.   On: 04/11/2021 00:44   MR Lumbar Spine W Wo Contrast  Result Date: 04/11/2021 CLINICAL DATA:  Motor neuron disease EXAM: MRI LUMBAR SPINE WITHOUT AND WITH CONTRAST TECHNIQUE: Multiplanar and multiecho pulse sequences of the lumbar spine were obtained without and with intravenous contrast. CONTRAST:  31m531mADAVIST GADOBUTROL 1 MMOL/ML IV SOLN COMPARISON:  None. FINDINGS: Segmentation:  Standard Alignment:  Dextroscoliosis with apex at L3 Vertebrae:  No  acute abnormality. Conus medullaris and cauda equina: Conus extends to the L2 level. Conus and cauda equina appear normal. No abnormal contrast enhancement. Paraspinal and other soft tissues: Fatty atrophy of the paraspinous musculature. Disc levels: T12-L1: Left asymmetric disc bulge.  No stenosis. L1-L2: Left asymmetric disc bulge and mild facet hypertrophy. No spinal canal stenosis. Mild left neural foraminal stenosis. L2-L3: Moderate facet hypertrophy with small disc bulge. No spinal canal stenosis. Mild left neural foraminal stenosis. L3-L4: Small disc bulge with mild facet hypertrophy. No spinal canal stenosis. No neural foraminal stenosis. L4-L5: Small disc bulge with severe right and mild left facet hypertrophy. No spinal canal stenosis. Mild right neural foraminal stenosis. L5-S1: Severe right and mild left facet hypertrophy. Small right asymmetric disc bulge. No spinal canal stenosis. Mild right neural foraminal stenosis. Visualized sacrum: Normal. IMPRESSION: 1. No acute abnormality of the lumbar spine. 2. Dextroscoliosis with apex at L3. 3. Multilevel moderate to severe facet arthrosis, worst at right L4-5 and L5-S1. 4. Mild neural foraminal stenosis at left L1-L2, left L2-L3, right L4-L5 and right L5-S1. Electronically Signed   By: KevUlyses JarredD.   On: 04/11/2021 00:51     Assessment: [age]ar old female with BLE weakness and progressively worsening gait x 4-6 weeks, with more rapid worsening over the past week.  1. Exam reveals  upper and lower extremity weakness bilaterally, significantly worse in the BLE. Reflexes are present, except at the ankles. Large fiber sensory loss in a length dependent pattern to BLE. Also with stocking distribution temp and FT sensory loss to BLE. Muscle wasting is present, worse to the thighs, but there is no muscle bed tenderness.  2. MRI brain reveals moderate diffuse atrophy of the cerebral hemispheres and mild diffuse atrophy of the midbrain, pons and cerebellar  vermis. Mild. chronic microvascular ischemic changes are also noted.  3. MRI lumbar spine with no acute abnormality. Dextroscoliosis and degenerative changes are noted without  neural impingement. The conus medullaris and visible caudal segments of the thoracic spinal cord are unremarkable.  4. EKG: No atrial fibrillation or flutter. Per Hospitalist team, there is no suspicion for acutely decompensated heart failure this admission. Her creatinine is within her baseline range. She does have a non-gap metabolic acidosis of unclear etiology.  5. DDx: Stroke and cord compression have been ruled out by MRI. At risk for radiculopathy given lumbar degenerative changes, but no actual nerve root compression seen. Bilateral subacute inflammatory or neoplastic plexopathy is on the DDx - she does have a history of uterine cancer. An asymmetric manifestation of a polyradiculoneuropathy such as AIDP or CIDP is possible. Myopathy (including statin-induced) or a myositis is possible. NMJ defect unlikely as this would also be expected to involve the upper extremities if LEMS and/or the face and bulbar muscles if MG. Bilateral diabetic amyotrophy is a consideration given her DM.   Impression: - Overall, based on the findings so far, the most likely components of the DDx are felt to be diabetic amyotrophy, CIDP and a paraneoplastic syndrome.  - Diabetic amyotrophy:  -Diabetic amyotrophy is of intermediate likelihood as she has no associated pain, and significant BLE pain is typically present in diabetic amyotrophy. Her upper extremities are also weak to an extent, which would be less typical as well. Per the literature, Diabetic Amyotrophy (aka Diabetic Lumbosacral Radiculoplexus Neuropathy, or DLRPN) is an episodic, monophasic, asymmetrical neuropathy, with acute to subacute onset. The belief is that it is due to an immune-mediated, inflammatory state, resulting in vasculitis with ischemic nerve injury. This disorder may last  from a few months, and up to 2 years. Risk factors include rapid glycemic management, tight glycemic management, recently started hyperglycemic treatment, immunizations, trauma and infections.  - Multiple small studies have correlated improvement of patient outcomes regarding symptomatic improvement with the use of immunosuppressant agents: steroids, immunoglobulin, and plasma exchange, but clear evidence is still necessary due to conflicting outcomes. A double blind study of weekly IV methylprednisolone x 12 weeks, however, failed to show a benefit in terms of long term outcome relative to placebo.  - The treatment itself is centered mainly on symptomatic management of pain, management of hyperglycemia, and improvement in mobility. Pain management with paracetamol and NSAIDS is an option. Amitriptyline at night (particularly in accompanying insomnia), selective serotonin receptor inhibitors (SSRIs) for depression/anxiety and anticonvulsant agents are also possibilities - CIDP/AIDP: Also of intermediate likelihood given preserved reflexes - Paraneoplastic syndrome: Possible but lower likelihood epidemiologically, although she does have a history of uterine cancer  Recommendations: 1. MRI of pelvis without contrast (cannot use contrast due to her CKD) to rule out infiltrative/compressive pelvic malignancy.  2. CT of chest/abdomen and pelvis to evaluate for possible malignancy 3. Stop pravastatin.  4. CK level 5. Expedited follow up for EMG/NCS with [facility]. 6. ESR, CRP, HgbA1c, LFTs.  7. MRI of  cervical and thoracic spine 8. If imaging above is negative, will likely need LP. Expected result for CIDP/AIDP would be albuminocytologic dissociation, while for DLRPN the expected result would be elevated protein with pleocytosis.     Electronically signed: Dr. Kerney Elbe 04/11/2021, 3:22 AM

## 2021-04-12 ENCOUNTER — Inpatient Hospital Stay (HOSPITAL_COMMUNITY): Payer: Medicare Other

## 2021-04-12 DIAGNOSIS — N1832 Chronic kidney disease, stage 3b: Secondary | ICD-10-CM | POA: Diagnosis not present

## 2021-04-12 DIAGNOSIS — R29898 Other symptoms and signs involving the musculoskeletal system: Secondary | ICD-10-CM | POA: Diagnosis not present

## 2021-04-12 DIAGNOSIS — E119 Type 2 diabetes mellitus without complications: Secondary | ICD-10-CM | POA: Diagnosis not present

## 2021-04-12 LAB — BASIC METABOLIC PANEL
Anion gap: 9 (ref 5–15)
BUN: 27 mg/dL — ABNORMAL HIGH (ref 8–23)
CO2: 20 mmol/L — ABNORMAL LOW (ref 22–32)
Calcium: 8.9 mg/dL (ref 8.9–10.3)
Chloride: 108 mmol/L (ref 98–111)
Creatinine, Ser: 1.66 mg/dL — ABNORMAL HIGH (ref 0.44–1.00)
GFR, Estimated: 29 mL/min — ABNORMAL LOW (ref >60)
Glucose, Bld: 218 mg/dL — ABNORMAL HIGH (ref 70–99)
Potassium: 4.9 mmol/L (ref 3.5–5.1)
Sodium: 137 mmol/L (ref 135–145)

## 2021-04-12 LAB — GLUCOSE, CAPILLARY
Glucose-Capillary: 159 mg/dL — ABNORMAL HIGH (ref 70–99)
Glucose-Capillary: 229 mg/dL — ABNORMAL HIGH (ref 70–99)
Glucose-Capillary: 259 mg/dL — ABNORMAL HIGH (ref 70–99)
Glucose-Capillary: 287 mg/dL — ABNORMAL HIGH (ref 70–99)

## 2021-04-12 LAB — SEDIMENTATION RATE: Sed Rate: 39 mm/hr — ABNORMAL HIGH (ref 0–22)

## 2021-04-12 IMAGING — CT CT CHEST-ABD-PELV W/O CM
2 of 4 series · 14 of 46 positions shown, 16 images · non-contrast
Comparison: [DATE]

CLINICAL DATA: Cranial neuropathy.  Weakness.

EXAM:
CT CHEST, ABDOMEN AND PELVIS WITHOUT CONTRAST
TECHNIQUE: Multidetector CT imaging of the chest, abdomen and pelvis was
performed following the standard protocol without IV contrast.

[Series 3: cap without · axial · non-contrast · 0.79mm/px · z∈[-577,-37]mm · 11 of 126 slices shown, 13 images]
[im 9/126  soft-tissue]
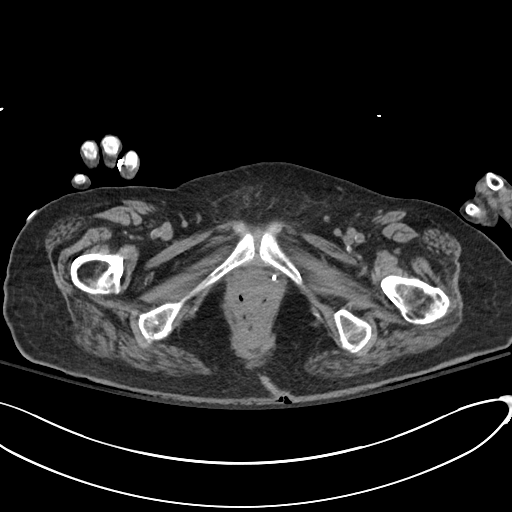
[im 9/126  bone]
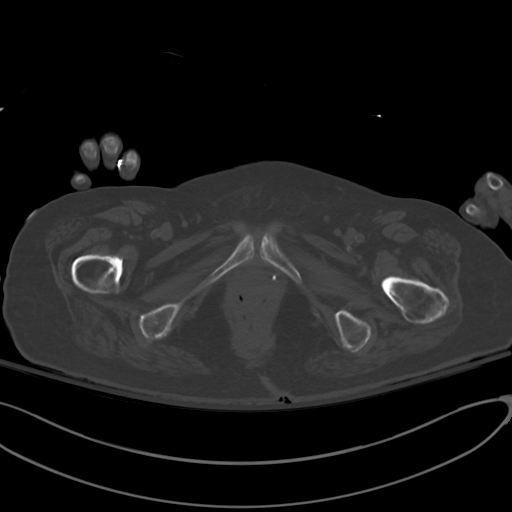
[im 18/126  soft-tissue]
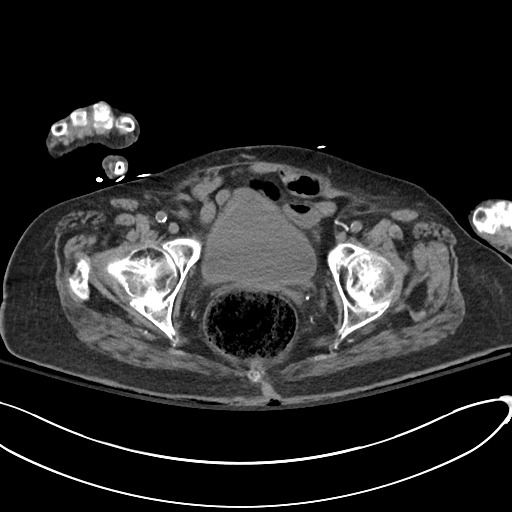
[im 27/126  soft-tissue]
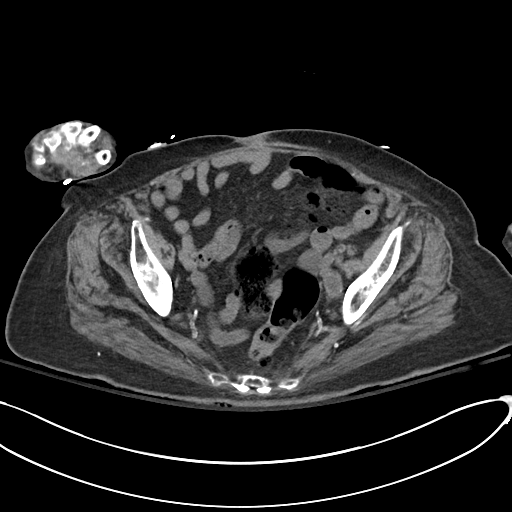
[im 45/126  soft-tissue]
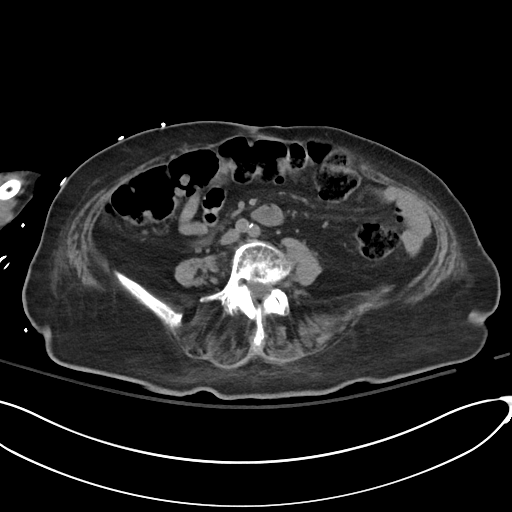
[im 54/126  soft-tissue]
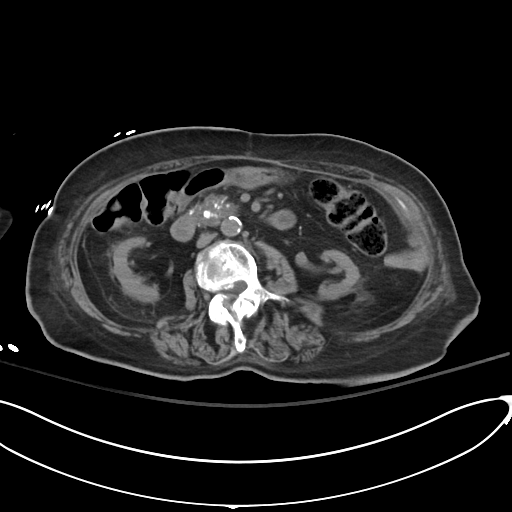
[im 63/126  soft-tissue]
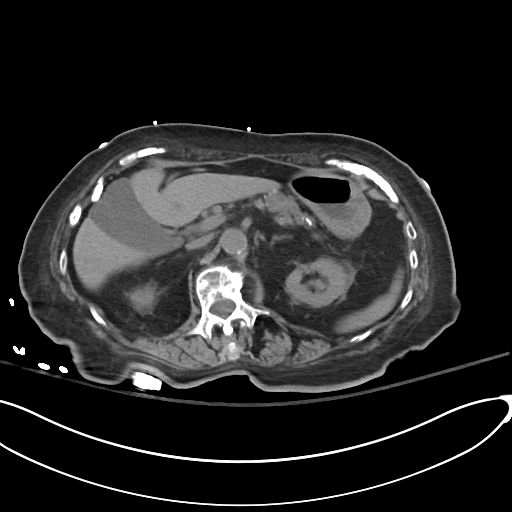
[im 72/126  soft-tissue]
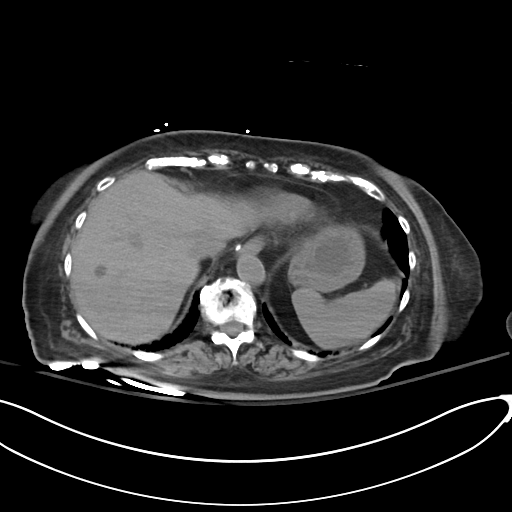
[im 81/126  soft-tissue]
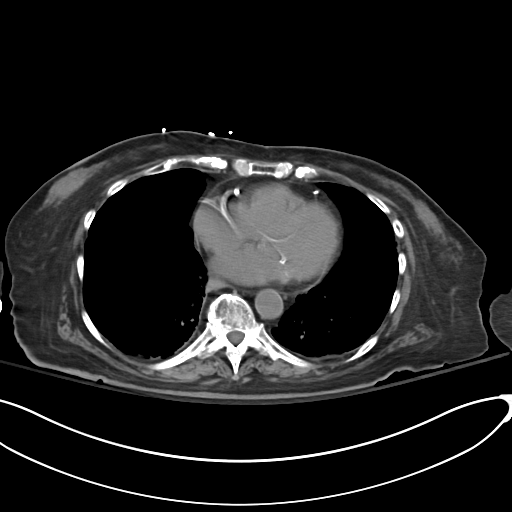
[im 99/126  soft-tissue]
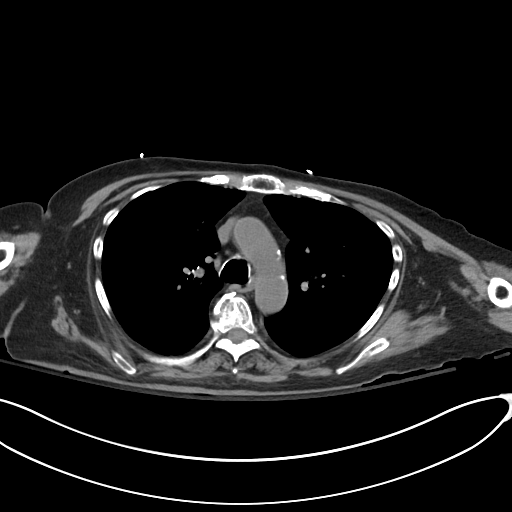
[im 99/126  bone]
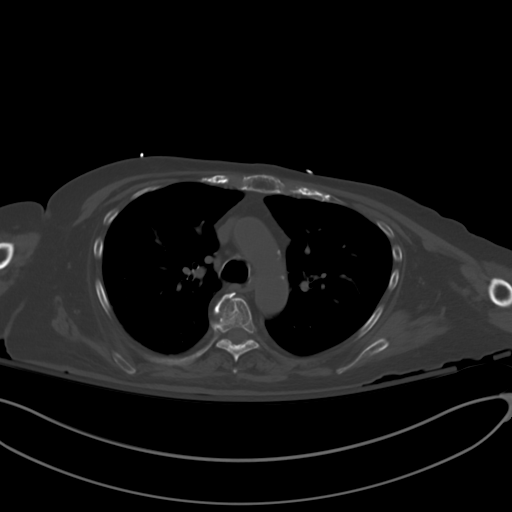
[im 108/126  soft-tissue]
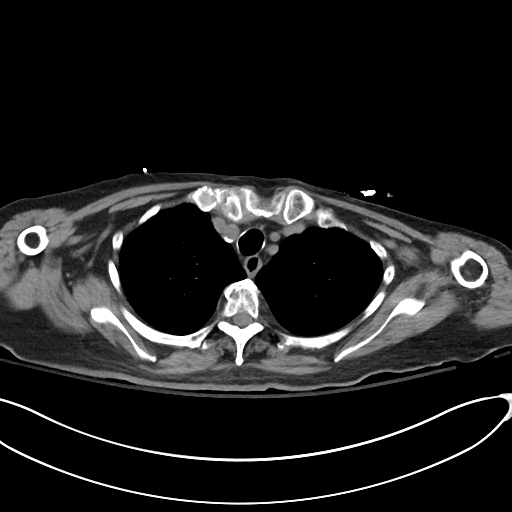
[im 117/126  soft-tissue]
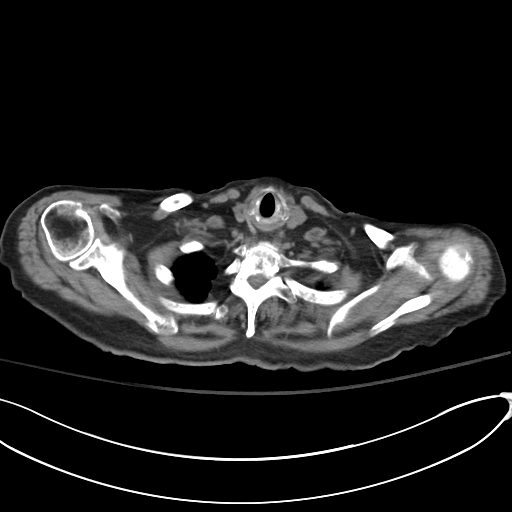

[Series 6: cor · coronal · 0.79mm/px · 3 of 78 slices shown]
[im 26/78  soft-tissue]
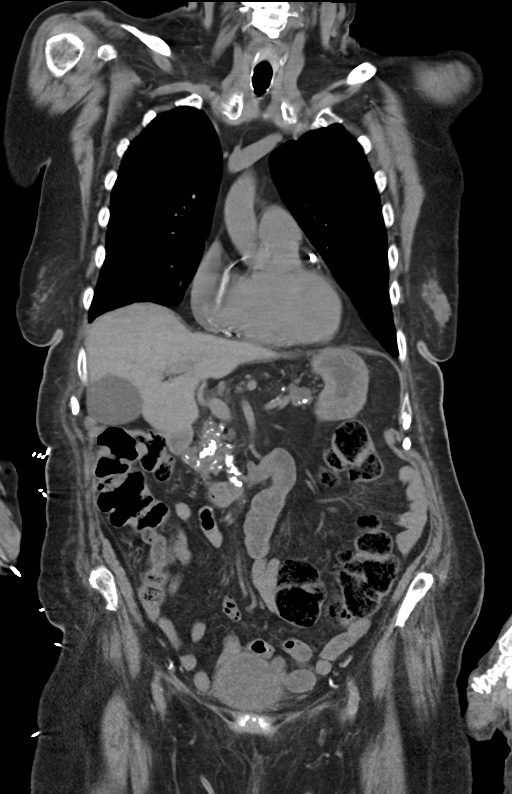
[im 35/78  soft-tissue]
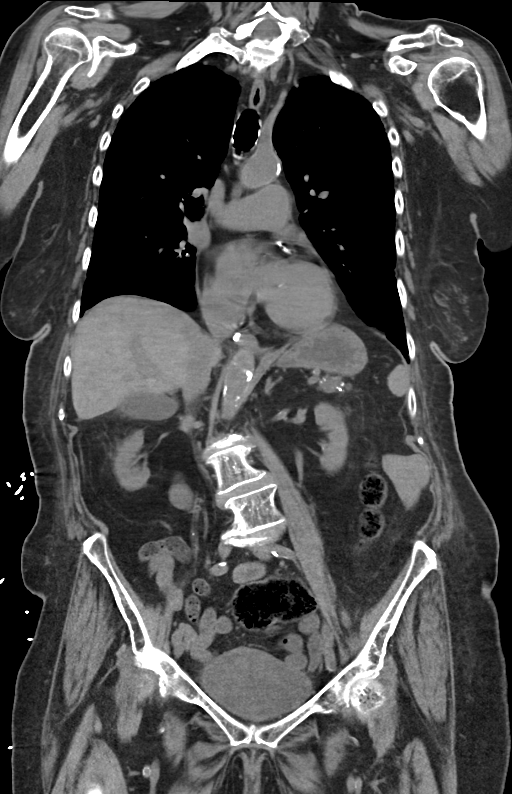
[im 43/78  soft-tissue]
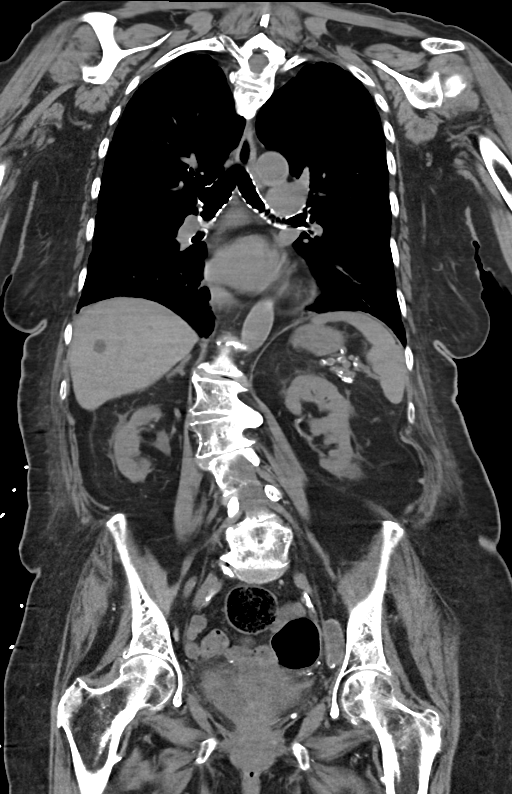

[14 of 46 positions shown; findings below may reference images not displayed]

FINDINGS: CT CHEST FINDINGS

Cardiovascular: Normal heart size. No pericardial effusions.
Coronary artery calcifications. Probable coronary stent. Normal
caliber thoracic aorta with scattered calcification.

Mediastinum/Nodes: Esophagus is decompressed. No significant
lymphadenopathy. Thyroid gland is unremarkable.

Lungs/Pleura: Slight fibrosis in the lung periphery and base,
suggesting early usual interstitial pneumonitis. Mild traction
bronchiectasis in the bases. No focal consolidation. No pleural
effusions. No pneumothorax.

Musculoskeletal: Prominent degenerative changes throughout the
thoracic spine with bridging osteophyte formation. Mild
thoracolumbar scoliosis convex towards the right.

CT ABDOMEN PELVIS FINDINGS

Hepatobiliary: Small low-attenuation lesion in the right lobe of the
liver probably represents a cyst. Gallbladder is moderately
distended but without wall thickening or stone. No bile duct
dilatation.

Pancreas: Pancreas is atrophic. Scattered calcifications throughout
the pancreas consistent with sequela of chronic pancreatitis. No
acute inflammatory changes.

Spleen: Normal in size without focal abnormality.

Adrenals/Urinary Tract: No adrenal gland nodules. Bilateral renal
parenchymal atrophy. No hydronephrosis or hydroureter. No renal or
ureteral stones. Bladder wall is mildly thickened suggesting
possible cystitis. Correlate with urinalysis.

Stomach/Bowel: Stomach, small bowel, and colon are not abnormally
distended. There is a broad-based anterior abdominal wall hernia
containing small bowel and transverse colon. No proximal obstruction
or wall thickening is appreciated. Prominent stool in the
rectosigmoid colon. Appendix is not identified.

Vascular/Lymphatic: Aortic atherosclerosis. No enlarged abdominal or
pelvic lymph nodes.

Reproductive: Status post hysterectomy. No adnexal masses.

Other: No free air or free fluid in the abdomen.

Musculoskeletal: Degenerative changes in the lumbar spine. Mild
thoracolumbar scoliosis convex towards the right. Sclerosis and
lucency in the superior surface of the femoral heads suggest
possible avascular necrosis.
IMPRESSION: 1. Slight fibrosis in the lung periphery and bases. Mild traction
bronchiectasis. No focal consolidation.
2. Diffuse pancreatic calcification consistent with chronic
pancreatitis. No acute changes.
3. Bilateral renal atrophy.
4. Broad-based anterior abdominal wall hernia containing bowel and
transverse colon but without proximal obstruction.
5. Aortic atherosclerosis.

## 2021-04-12 MED ORDER — INSULIN ASPART 100 UNIT/ML IJ SOLN
2.0000 [IU] | Freq: Three times a day (TID) | INTRAMUSCULAR | Status: DC
  Administered 2021-04-12 – 2021-04-20 (×23): 2 [IU] via SUBCUTANEOUS

## 2021-04-12 MED ORDER — SODIUM CHLORIDE 0.45 % IV SOLN: INTRAVENOUS | Status: AC

## 2021-04-12 MED ORDER — FOOD THICKENER (SIMPLYTHICK)
1.0000 | ORAL | Status: DC | PRN
  Administered 2021-04-18: 1 via ORAL
  Filled 2021-04-12 (×2): qty 1

## 2021-04-12 MED ORDER — INSULIN ASPART 100 UNIT/ML IJ SOLN
0.0000 [IU] | Freq: Three times a day (TID) | INTRAMUSCULAR | Status: DC
  Administered 2021-04-12: 2 [IU] via SUBCUTANEOUS
  Administered 2021-04-12 – 2021-04-13 (×2): 3 [IU] via SUBCUTANEOUS
  Administered 2021-04-13 (×2): 2 [IU] via SUBCUTANEOUS
  Administered 2021-04-14: 3 [IU] via SUBCUTANEOUS
  Administered 2021-04-14 (×2): 1 [IU] via SUBCUTANEOUS
  Administered 2021-04-15: 2 [IU] via SUBCUTANEOUS
  Administered 2021-04-15: 1 [IU] via SUBCUTANEOUS
  Administered 2021-04-15: 2 [IU] via SUBCUTANEOUS
  Administered 2021-04-16 (×2): 1 [IU] via SUBCUTANEOUS
  Administered 2021-04-16 – 2021-04-17 (×2): 3 [IU] via SUBCUTANEOUS
  Administered 2021-04-17 – 2021-04-18 (×3): 2 [IU] via SUBCUTANEOUS
  Administered 2021-04-18 – 2021-04-20 (×5): 1 [IU] via SUBCUTANEOUS

## 2021-04-12 MED ORDER — INSULIN ASPART 100 UNIT/ML IJ SOLN
0.0000 [IU] | Freq: Every day | INTRAMUSCULAR | Status: DC
  Administered 2021-04-12 – 2021-04-13 (×2): 3 [IU] via SUBCUTANEOUS
  Administered 2021-04-14: 5 [IU] via SUBCUTANEOUS
  Administered 2021-04-15: 2 [IU] via SUBCUTANEOUS
  Administered 2021-04-16: 4 [IU] via SUBCUTANEOUS

## 2021-04-12 NOTE — Progress Notes (Addendum)
[facility] PROGRESS NOTE    Progress Note  Catherine Mason  DGL:[id number] DOB: 06-Oct-1927 DOA: 04/10/2021 PCP: Reynold Bowen, MD     Brief Narrative:   Catherine Mason is an 87 y.o. female past medical history significant for diabetes mellitus type 2 chronic kidney disease stage IIIb creatinine 1.6-1.8, essential hypertension chronic diastolic heart failure admitted to [facility] on 04/10/2021 for bilateral lower extremity weakness and relates that Friday she was ambulatory but the next day she could not get up, she relates she has been feeling weak for several weeks prior to admission.  MRI of the whole spine showed no acute findings neurology was consulted.  Significant studies: MRI of the brain showed no stroke. MRI of the lumbar spine shows severe osteoarthritis.  With mild neuroforaminal stenosis at L1-L2 L2-L3 and L4-L5 MRI of the cervical C-spine showed multiple spinal level canal stenosis most notably and C5-C6 MRI of the thoracic spine showed no myelopathy or spinal cord dysfunction no evidence of impingement. Antibiotics: None  Microbiology data: Blood culture:  Procedures: None  Assessment/Plan:   Subacute lower extremity weakness: Differential is broad including diabetic mild trophy, paraneoplastic syndrome and CIDP. She denies any pain which makes diabetic angiography unlikely. Neurology was consulted recommended an MRI of the pelvis without contrast to rule out infiltrative process. CT of the chest and abdomen that showed fibrosis of lung no consolidation pancreatic calcification bilateral renal atrophy abdominal wall hernia without obstruction and aortic atherosclerosis. Statins were stopped, B6 CRP was 1.0 sed rate of 39. If work-up will probably need an LP. Question a lot of this is likely due to dehydration she relates her legs were swollen before and all the wrinkled, she has had decreasing oral intake her furosemide has not been changed.  Non-anion  gap metabolic acidosis: Associated with hyperchloremia.  Start her on half-normal saline recheck basic metabolic panel in the morning. She denies any diarrhea. Urinalysis shows specific gravity of 1013 rare bacteria UA shows specific gravity of 1013 rare bacteria more than 50 white blood cells.  Chronic kidney disease stage IIIb: Unknown baseline on admission 1.8. She was given a liter bolus in the ED now creatinine is 1.5 will repeat tomorrow morning. She only has 100 of protein in her urine.  Diabetes mellitus type II uncontrolled: With an A1c of 9.0 she has required minimal insulin, will change to very sensitive sliding scale insulin.  Essential  HTN (hypertension) Continue current home regimen of Norvasc lisinopril and Toprol.  Her blood pressure seems to be well controlled.  Acquired hypothyroidism: Continue current home dose of Synthroid.  Chronic diastolic heart failure: Monitor strict I's and O's continue current regimen, except for diuretics which we will hold.  Monitor electrolytes.   DVT prophylaxis: lovenox Family Communication: Son Status is: Inpatient  Remains inpatient appropriate because:Hemodynamically unstable  Dispo: The patient is from: Home              Anticipated d/c is to: SNF              Patient currently is not medically stable to d/c.   Difficult to place patient No     Code Status:     Code Status Orders  (From admission, onward)           Start     Ordered   04/11/21 0147  Full code  Continuous        04/11/21 0147           Code  Status History     This patient has a current code status but no historical code status.      Advance Directive Documentation    [place] Most Recent Value  Type of Advance Directive Living will  Pre-existing out of facility DNR order (yellow form or pink MOST form) --  "MOST" Form in Place? --         IV Access:   Peripheral IV   Procedures and diagnostic studies:   MR BRAIN  W WO CONTRAST  Result Date: 04/11/2021 CLINICAL DATA:  Acute neurologic deficit EXAM: MRI HEAD WITHOUT AND WITH CONTRAST TECHNIQUE: Multiplanar, multiecho pulse sequences of the brain and surrounding structures were obtained without and with intravenous contrast. CONTRAST:  81mL GADAVIST GADOBUTROL 1 MMOL/ML IV SOLN, 4mL GADAVIST GADOBUTROL 1 MMOL/ML IV SOLN COMPARISON:  None. FINDINGS: Brain: No acute infarct, mass effect or extra-axial collection. No acute or chronic hemorrhage. There is multifocal hyperintense T2-weighted signal within the white matter. Generalized volume loss without a clear lobar predilection. The midline structures are normal. Vascular: Major flow voids are preserved. Skull and upper cervical spine: Normal calvarium and skull base. Visualized upper cervical spine and soft tissues are normal. Sinuses/Orbits:No paranasal sinus fluid levels or advanced mucosal thickening. No mastoid or middle ear effusion. Normal orbits. IMPRESSION: 1. No acute intracranial abnormality. 2. Findings of chronic microvascular disease and mild volume loss. Electronically Signed   By: Ulyses Jarred M.D.   On: 04/11/2021 00:44   MR CERVICAL SPINE W WO CONTRAST  Result Date: 04/11/2021 CLINICAL DATA:  Myelopathy, acute or progressive. EXAM: MRI CERVICAL SPINE WITHOUT AND WITH CONTRAST TECHNIQUE: Multiplanar and multiecho pulse sequences of the cervical spine, to include the craniocervical junction and cervicothoracic junction, were obtained without and with intravenous contrast. CONTRAST:  64mL GADAVIST GADOBUTROL 1 MMOL/ML IV SOLN COMPARISON:  None FINDINGS: Mildly motion degraded exam. Alignment: Straightening of the expected cervical lordosis. No significant spondylolisthesis. Vertebrae: Vertebral body height is maintained. Multilevel degenerative endplate irregularity. No significant marrow edema or focal suspicious osseous lesion. Cord: No signal abnormality identified within the cervical spinal cord. Posterior  Fossa, vertebral arteries, paraspinal tissues: Cerebellar atrophy. Flow voids preserved within the imaged cervical vertebral arteries. Paraspinal soft tissues unremarkable. Bilateral mastoid effusions. Disc levels: Multilevel disc degeneration. Most notably, there is advanced disc degeneration at C4-C5, C5-C6, C6-C7 and T1-T2. C2-C3: Shallow disc bulge with bilateral uncovertebral hypertrophy. No significant spinal canal or foraminal stenosis. C3-C4: Shallow disc bulge with bilateral uncovertebral hypertrophy. Facet arthrosis and ligamentum flavum hypertrophy. Mild relative spinal canal narrowing (without spinal cord mass effect). Bilateral neural foraminal narrowing (mild right, moderate left). C4-C5: Posterior disc osteophyte complex with bilateral disc osteophyte ridge/uncinate hypertrophy. Facet arthrosis and ligamentum flavum hypertrophy. Mild relative spinal canal narrowing (without spinal cord mass effect). Bilateral neural foraminal narrowing (moderate/severe right, mild left). C5-C6: Posterior disc osteophyte complex with bilateral disc osteophyte ridge/uncinate hypertrophy. Facet arthrosis and ligamentum flavum hypertrophy. Mild/moderate spinal canal stenosis with contact upon the dorsal and ventral spinal cord. Bilateral neural foraminal narrowing (moderate/severe right, mild left). C6-C7: Disc bulge with bilateral disc osteophyte ridge/uncinate hypertrophy. Minimal partial effacement of the ventral thecal sac (without spinal cord mass effect). Mild/moderate right neural foraminal narrowing. C7-T1: Shallow disc bulge. Minimal facet arthrosis. No significant spinal canal or foraminal stenosis. IMPRESSION: Mildly motion degraded exam. Within the limitations of motion degradation, no spinal cord signal abnormality is identified. Cervical spondylosis, as outlined. Multilevel spinal canal stenosis. Most notably, there is multifactorial mild/moderate spinal canal stenosis  at C5-C6. Multilevel foraminal  stenosis, as detailed and greatest on the left at C3-C4 (moderate), on the right at C4-C5 (moderate/severe), and on the right at C5-C6 (moderate/severe). Disc degeneration is greatest (severe) at C4-C5, C5-C6, C6-C7 and T1-T2. Electronically Signed   By: Kellie Simmering D.O.   On: 04/11/2021 15:43   MR THORACIC SPINE W WO CONTRAST  Result Date: 04/11/2021 CLINICAL DATA:  Progressive myelopathy. EXAM: MRI THORACIC WITHOUT AND WITH CONTRAST TECHNIQUE: Multiplanar and multiecho pulse sequences of the thoracic spine were obtained without and with intravenous contrast. CONTRAST:  71mL GADAVIST GADOBUTROL 1 MMOL/ML IV SOLN COMPARISON:  None. FINDINGS: Alignment:  2 mm retrolisthesis of L1 on L2 and L2 on L3. Vertebrae: No fracture, evidence of discitis, or bone lesion. Cord:  Normal signal and morphology. Paraspinal and other soft tissues: Negative. Disc levels: Disc spaces: Degenerative disease with disc height loss at T1-2. Disc desiccation throughout the remainder of the thoracic spine. T1-T2: Mild broad-based disc bulge. No foraminal or central canal stenosis. T2-T3: No disc protrusion, foraminal stenosis or central canal stenosis. T3-T4: No disc protrusion, foraminal stenosis or central canal stenosis. T4-T5: No disc protrusion, foraminal stenosis or central canal stenosis. T5-T6: Minimal broad-based disc bulge. No foraminal or central canal stenosis. T6-T7: No disc protrusion, foraminal stenosis or central canal stenosis. T7-T8: No disc protrusion, foraminal stenosis or central canal stenosis. T8-T9: No disc protrusion, foraminal stenosis or central canal stenosis. T9-T10: No disc protrusion, foraminal stenosis or central canal stenosis. T10-T11: Minimal broad-based disc bulge. Mild bilateral facet arthropathy. No foraminal or central canal stenosis. T11-T12: Minimal broad-based disc bulge. No foraminal or central canal stenosis. T12-L1: Mild broad-based disc bulge. No foraminal or central canal stenosis. L1-2: Mild  broad-based disc bulge. Moderate bilateral facet arthropathy. Mild left foraminal stenosis. Mild spinal stenosis. L2-3: Broad-based disc osteophyte complex. Moderate bilateral facet arthropathy. IMPRESSION: 1.  No acute osseous injury of the thoracic spine. 2. No myelopathy of the thoracic spinal cord. 3. No evidence of nerve root impingement of the thoracic spine. Electronically Signed   By: Kathreen Devoid M.D.   On: 04/11/2021 15:47   MR Lumbar Spine W Wo Contrast  Result Date: 04/11/2021 CLINICAL DATA:  Motor neuron disease EXAM: MRI LUMBAR SPINE WITHOUT AND WITH CONTRAST TECHNIQUE: Multiplanar and multiecho pulse sequences of the lumbar spine were obtained without and with intravenous contrast. CONTRAST:  84mL GADAVIST GADOBUTROL 1 MMOL/ML IV SOLN COMPARISON:  None. FINDINGS: Segmentation:  Standard Alignment:  Dextroscoliosis with apex at L3 Vertebrae:  No acute abnormality. Conus medullaris and cauda equina: Conus extends to the L2 level. Conus and cauda equina appear normal. No abnormal contrast enhancement. Paraspinal and other soft tissues: Fatty atrophy of the paraspinous musculature. Disc levels: T12-L1: Left asymmetric disc bulge.  No stenosis. L1-L2: Left asymmetric disc bulge and mild facet hypertrophy. No spinal canal stenosis. Mild left neural foraminal stenosis. L2-L3: Moderate facet hypertrophy with small disc bulge. No spinal canal stenosis. Mild left neural foraminal stenosis. L3-L4: Small disc bulge with mild facet hypertrophy. No spinal canal stenosis. No neural foraminal stenosis. L4-L5: Small disc bulge with severe right and mild left facet hypertrophy. No spinal canal stenosis. Mild right neural foraminal stenosis. L5-S1: Severe right and mild left facet hypertrophy. Small right asymmetric disc bulge. No spinal canal stenosis. Mild right neural foraminal stenosis. Visualized sacrum: Normal. IMPRESSION: 1. No acute abnormality of the lumbar spine. 2. Dextroscoliosis with apex at L3. 3.  Multilevel moderate to severe facet arthrosis, worst at right L4-5 and  L5-S1. 4. Mild neural foraminal stenosis at left L1-L2, left L2-L3, right L4-L5 and right L5-S1. Electronically Signed   By: Ulyses Jarred M.D.   On: 04/11/2021 00:51   CT CHEST ABDOMEN PELVIS WO CONTRAST  Result Date: 04/12/2021 CLINICAL DATA:  Cranial neuropathy.  Weakness. EXAM: CT CHEST, ABDOMEN AND PELVIS WITHOUT CONTRAST TECHNIQUE: Multidetector CT imaging of the chest, abdomen and pelvis was performed following the standard protocol without IV contrast. COMPARISON:  11/05/2009 FINDINGS: CT CHEST FINDINGS Cardiovascular: Normal heart size. No pericardial effusions. Coronary artery calcifications. Probable coronary stent. Normal caliber thoracic aorta with scattered calcification. Mediastinum/Nodes: Esophagus is decompressed. No significant lymphadenopathy. Thyroid gland is unremarkable. Lungs/Pleura: Slight fibrosis in the lung periphery and base, suggesting early usual interstitial pneumonitis. Mild traction bronchiectasis in the bases. No focal consolidation. No pleural effusions. No pneumothorax. Musculoskeletal: Prominent degenerative changes throughout the thoracic spine with bridging osteophyte formation. Mild thoracolumbar scoliosis convex towards the right. CT ABDOMEN PELVIS FINDINGS Hepatobiliary: Small low-attenuation lesion in the right lobe of the liver probably represents a cyst. Gallbladder is moderately distended but without wall thickening or stone. No bile duct dilatation. Pancreas: Pancreas is atrophic. Scattered calcifications throughout the pancreas consistent with sequela of chronic pancreatitis. No acute inflammatory changes. Spleen: Normal in size without focal abnormality. Adrenals/Urinary Tract: No adrenal gland nodules. Bilateral renal parenchymal atrophy. No hydronephrosis or hydroureter. No renal or ureteral stones. Bladder wall is mildly thickened suggesting possible cystitis. Correlate with urinalysis.  Stomach/Bowel: Stomach, small bowel, and colon are not abnormally distended. There is a broad-based anterior abdominal wall hernia containing small bowel and transverse colon. No proximal obstruction or wall thickening is appreciated. Prominent stool in the rectosigmoid colon. Appendix is not identified. Vascular/Lymphatic: Aortic atherosclerosis. No enlarged abdominal or pelvic lymph nodes. Reproductive: Status post hysterectomy. No adnexal masses. Other: No free air or free fluid in the abdomen. Musculoskeletal: Degenerative changes in the lumbar spine. Mild thoracolumbar scoliosis convex towards the right. Sclerosis and lucency in the superior surface of the femoral heads suggest possible avascular necrosis. IMPRESSION: 1. Slight fibrosis in the lung periphery and bases. Mild traction bronchiectasis. No focal consolidation. 2. Diffuse pancreatic calcification consistent with chronic pancreatitis. No acute changes. 3. Bilateral renal atrophy. 4. Broad-based anterior abdominal wall hernia containing bowel and transverse colon but without proximal obstruction. 5. Aortic atherosclerosis. Electronically Signed   By: Lucienne Capers M.D.   On: 04/12/2021 01:50     Medical Consultants:   None.   Subjective:    Catherine Mason has no new complains  Objective:    Vitals:   04/11/21 2346 04/12/21 0341 04/12/21 0343 04/12/21 0756  BP:  133/61  127/66  Pulse:  78  70  Resp: 16 16  12   Temp: 97.7 F (36.5 C) 98 F (36.7 C)  98.5 F (36.9 C)  TempSrc: Oral Oral  Axillary  SpO2: 97%   99%  Weight:   66.3 kg   Height:       SpO2: 99 %   Intake/Output Summary (Last 24 hours) at 04/12/2021 0837 Last data filed at 04/11/2021 2338 Gross per 24 hour  Intake --  Output 375 ml  Net -375 ml   Filed Weights   04/11/21 1700 04/12/21 0343  Weight: 63.5 kg 66.3 kg    Exam: General exam: In no acute distress. Respiratory system: Good air movement and clear to auscultation. Cardiovascular  system: S1 & S2 heard, RRR. No JVD, murmurs, rubs, gallops or clicks.  Gastrointestinal system: Abdomen is nondistended,  soft and nontender.  Extremities: No pedal edema. Skin: No rashes, lesions or ulcers Psychiatry: Judgement and insight appear normal. Mood & affect appropriate.    Data Reviewed:    Labs: Basic Metabolic Panel: Recent Labs  Lab 04/10/21 1457 04/10/21 1513 04/11/21 0507  NA 138 142 139  K 5.0 4.8 4.9  CL 112* 113* 113*  CO2 18*  --  19*  GLUCOSE 159* 158* 206*  BUN 33* 31* 27*  CREATININE 1.80* 1.80* 1.57*  CALCIUM 8.7*  --  8.8*  MG  --   --  2.1   GFR Estimated Creatinine Clearance: 21.8 mL/min (A) (by C-G formula based on SCr of 1.57 mg/dL (H)). Liver Function Tests: Recent Labs  Lab 04/10/21 1457 04/11/21 0507  AST 25 18  ALT 23 17  ALKPHOS 156* 128*  BILITOT 0.9 0.8  PROT 7.4 6.2*  ALBUMIN 3.8 2.9*   No results for input(s): LIPASE, AMYLASE in the last 168 hours. No results for input(s): AMMONIA in the last 168 hours. Coagulation profile Recent Labs  Lab 04/10/21 1457  INR 1.0   COVID-19 Labs  Recent Labs    04/11/21 1651  CRP 1.0*    Lab Results  Component Value Date   SARSCOV2NAA NEGATIVE 04/10/2021    CBC: Recent Labs  Lab 04/10/21 1513 04/10/21 1558 04/11/21 0507  WBC  --  6.8 6.4  NEUTROABS  --  3.6  --   HGB 13.6 12.3 12.3  HCT 40.0 40.0 38.3  MCV  --  98.8 94.8  PLT  --  153 160   Cardiac Enzymes: Recent Labs  Lab 04/11/21 1651  CKTOTAL 36*   BNP (last 3 results) No results for input(s): PROBNP in the last 8760 hours. CBG: Recent Labs  Lab 04/11/21 0737 04/11/21 1210 04/11/21 1657 04/11/21 2132 04/12/21 0625  GLUCAP 175* 151* 129* 241* 159*   D-Dimer: No results for input(s): DDIMER in the last 72 hours. Hgb A1c: Recent Labs    04/11/21 1651  HGBA1C 9.0*   Lipid Profile: No results for input(s): CHOL, HDL, LDLCALC, TRIG, CHOLHDL, LDLDIRECT in the last 72 hours. Thyroid function  studies: No results for input(s): TSH, T4TOTAL, T3FREE, THYROIDAB in the last 72 hours.  Invalid input(s): FREET3 Anemia work up: No results for input(s): VITAMINB12, FOLATE, FERRITIN, TIBC, IRON, RETICCTPCT in the last 72 hours. Sepsis Labs: Recent Labs  Lab 04/10/21 1558 04/11/21 0507  WBC 6.8 6.4   Microbiology Recent Results (from the past 240 hour(s))  Resp Panel by RT-PCR (Flu A&B, Covid) Nasopharyngeal Swab     Status: None   Collection Time: 04/10/21  2:54 PM   Specimen: Nasopharyngeal Swab; Nasopharyngeal(NP) swabs in vial transport medium  Result Value Ref Range Status   SARS Coronavirus 2 by RT PCR NEGATIVE NEGATIVE Final    Comment: (NOTE) SARS-CoV-2 target nucleic acids are NOT DETECTED.  The SARS-CoV-2 RNA is generally detectable in upper respiratory specimens during the acute phase of infection. The lowest concentration of SARS-CoV-2 viral copies this assay can detect is 138 copies/mL. A negative result does not preclude SARS-Cov-2 infection and should not be used as the sole basis for treatment or other patient management decisions. A negative result may occur with  improper specimen collection/handling, submission of specimen other than nasopharyngeal swab, presence of viral mutation(s) within the areas targeted by this assay, and inadequate number of viral copies(<138 copies/mL). A negative result must be combined with clinical observations, patient history, and epidemiological information. The expected result is  Negative.  Fact Sheet for Patients:  EntrepreneurPulse.com.au  Fact Sheet for Healthcare Providers:  IncredibleEmployment.be  This test is no t yet approved or cleared by the [place] FDA and  has been authorized for detection and/or diagnosis of SARS-CoV-2 by FDA under an Emergency Use Authorization (EUA). This EUA will remain  in effect (meaning this test can be used) for the duration of the COVID-19  declaration under Section 564(b)(1) of the Act, 21 U.S.C.section 360bbb-3(b)(1), unless the authorization is terminated  or revoked sooner.       Influenza A by PCR NEGATIVE NEGATIVE Final   Influenza B by PCR NEGATIVE NEGATIVE Final    Comment: (NOTE) The Xpert Xpress SARS-CoV-2/FLU/RSV plus assay is intended as an aid in the diagnosis of influenza from Nasopharyngeal swab specimens and should not be used as a sole basis for treatment. Nasal washings and aspirates are unacceptable for Xpert Xpress SARS-CoV-2/FLU/RSV testing.  Fact Sheet for Patients: EntrepreneurPulse.com.au  Fact Sheet for Healthcare Providers: IncredibleEmployment.be  This test is not yet approved or cleared by the [place] FDA and has been authorized for detection and/or diagnosis of SARS-CoV-2 by FDA under an Emergency Use Authorization (EUA). This EUA will remain in effect (meaning this test can be used) for the duration of the COVID-19 declaration under Section 564(b)(1) of the Act, 21 U.S.C. section 360bbb-3(b)(1), unless the authorization is terminated or revoked.  Performed at [facility], [address] [place], [place] [address]      Medications:    amLODipine  5 mg Oral Daily   furosemide  40 mg Oral Q48H   insulin aspart  0-6 Units Subcutaneous TID WC   levothyroxine  50 mcg Oral QAC breakfast   lisinopril  5 mg Oral Daily   metoprolol succinate  50 mg Oral BID   Continuous Infusions:    LOS: 1 day   Charlynne Cousins  [facility]  04/12/2021, 8:37 AM

## 2021-04-12 NOTE — Evaluation (Signed)
Physical Therapy Evaluation Patient Details Name: Catherine Mason MRN: [id number] DOB: 01-Mar-1928 Today's Date: 04/12/2021  History of Present Illness  Pt is a 87 y/o female who presents with bilateral lower extremity weakness after presenting to [facility] ED via ED-to-ED transfer from [facility]. Imaging revealed no abnormalities in the brain but did reveal degenerative changes to the cervical and thoracic spine. PMH significant for type 2 diabetes mellitus, stage IIIb chronic kidney disease with baseline creatinine 1.6-1.8, HTN, chronic diastolic heart failure, dysphagia.   Clinical Impression  Pt admitted with above diagnosis. Pt currently with functional limitations due to the deficits listed below (see PT Problem List). At the time of PT eval pt was able to perform transfers with up to max assist for balance support and safety. The Stedy would be appropriate for nursing to mobilize pt to/from the recliner or BSC. Pt and family report that pt was fairly independent PTA, utilizing the rollator and getting help for meals/grocery shopping, however was independent with ADL's. Due to the sudden onset of weakness, would like have a rehab admissions coordinator screen the pt for potential admission. Pt's family reports they could be available at d/c to help as needed. Acutely, pt will benefit from skilled PT to increase their independence and safety with mobility to allow discharge to the venue listed below.          Recommendations for follow up therapy are one component of a multi-disciplinary discharge planning process, led by the attending physician.  Recommendations may be updated based on patient status, additional functional criteria and insurance authorization.  Follow Up Recommendations CIR    Equipment Recommendations  Other (comment) (TBD by next venue of care)    Recommendations for Other Services Rehab consult     Precautions / Restrictions Precautions Precautions: Fall Restrictions Weight  Bearing Restrictions: No      Mobility  Bed Mobility Overal bed mobility: Needs Assistance Bed Mobility: Rolling;Sidelying to Sit Rolling: Max assist Sidelying to sit: Mod assist;HOB elevated       General bed mobility comments: VC's to reach for railing, and bed pad utilized for complete roll to the L. Assist for trunk elevation to full sitting position.    Transfers Overall transfer level: Needs assistance Equipment used: 1 person hand held assist Transfers: Sit to/from [employer] Sit to Stand: Max assist;From elevated surface   Squat pivot transfers: Max assist;From elevated surface     General transfer comment: VC's for sequencing. Therapist facilitated attempt at standing and then squat pivot transfer to the recliner. Pt not able to accept much weight through her LE's due to weakness and gross max assist required.  Ambulation/Gait             General Gait Details: Unable to assess this session  Stairs            Wheelchair Mobility    Modified Rankin (Stroke Patients Only)       Balance Overall balance assessment: Needs assistance Sitting-balance support: Feet supported;Bilateral upper extremity supported Sitting balance-Leahy Scale: Poor Sitting balance - Comments: Reliant on at least 1 UE support. Postural control: Posterior lean;Left lateral lean                                   Pertinent Vitals/Pain Pain Assessment: No/denies pain Faces Pain Scale: No hurt    Home Living Family/patient expects to be discharged to:: Private residence Living Arrangements:  Alone Available Help at Discharge: Family;Available 24 hours/day Type of Home: House Home Access: Ramped entrance     Home Layout: One level Home Equipment: Shower seat - built in;Walker - 4 wheels;Cane - single point;Grab bars - tub/shower      Prior Function Level of Independence: Independent with assistive device(s)         Comments: Uses the  rollator. Family brings in meals - she does microwavce cooking, and helps with groceries. Does not drive. Independent with ADL's.     Hand Dominance        Extremity/Trunk Assessment   Upper Extremity Assessment Upper Extremity Assessment: Defer to OT evaluation    Lower Extremity Assessment Lower Extremity Assessment: Generalized weakness;RLE deficits/detail;LLE deficits/detail RLE Deficits / Details: 2/5 strength in quads/hamstrings/hip flexors. Pt reports pain in ankle with passive range of motion. LLE Deficits / Details: 2/5 strength in quads/hamstrings/hip flexors. Pt reports pain in ankle with passive range of motion.    Cervical / Trunk Assessment Cervical / Trunk Assessment: Kyphotic Cervical / Trunk Exceptions: With forward head posture with rounded shoulders  Communication   Communication: HOH  Cognition Arousal/Alertness: Awake/alert Behavior During Therapy: WFL for tasks assessed/performed Overall Cognitive Status: Within Functional Limits for tasks assessed                                        General Comments      Exercises     Assessment/Plan    PT Assessment Patient needs continued PT services  PT Problem List Decreased strength;Decreased range of motion;Decreased balance;Decreased mobility;Decreased coordination;Decreased knowledge of use of DME;Decreased safety awareness;Decreased knowledge of precautions;Decreased activity tolerance       PT Treatment Interventions DME instruction;Gait training;Functional mobility training;Therapeutic exercise;Therapeutic activities;Neuromuscular re-education;Patient/family education    PT Goals (Current goals can be found in the Care Plan section)  Acute Rehab PT Goals Patient Stated Goal: Be able to walk again PT Goal Formulation: With patient/family Time For Goal Achievement: 04/19/21 Potential to Achieve Goals: Good    Frequency Min 3X/week   Barriers to discharge        Co-evaluation                AM-PAC PT "6 Clicks" Mobility  Outcome Measure Help needed turning from your back to your side while in a flat bed without using bedrails?: A Lot Help needed moving from lying on your back to sitting on the side of a flat bed without using bedrails?: A Lot Help needed moving to and from a bed to a chair (including a wheelchair)?: A Lot Help needed standing up from a chair using your arms (e.g., wheelchair or bedside chair)?: A Lot Help needed to walk in hospital room?: Total Help needed climbing 3-5 steps with a railing? : Total 6 Click Score: 10    End of Session Equipment Utilized During Treatment: Gait belt Activity Tolerance: Patient limited by fatigue (weakness) Patient left: in chair;with call bell/phone within reach;with chair alarm set;with family/visitor present Nurse Communication: Mobility status PT Visit Diagnosis: Unsteadiness on feet (R26.81);Muscle weakness (generalized) (M62.81);Difficulty in walking, not elsewhere classified (R26.2)    Time: 3149-7026 PT Time Calculation (min) (ACUTE ONLY): 52 min   Charges:   PT Evaluation $PT Eval Moderate Complexity: 1 Mod PT Treatments $Therapeutic Activity: 23-37 mins        Rolinda Roan, PT, DPT Acute Rehabilitation Services Pager: [phone] Office: [phone]  Thelma Comp 04/12/2021, 2:05 PM

## 2021-04-12 NOTE — Evaluation (Signed)
Clinical/Bedside Swallow Evaluation Patient Details  Name: Catherine Mason MRN: [id number] Date of Birth: January 13, 1928  Today's Date: 04/12/2021 Time: SLP Start Time (ACUTE ONLY): 8502 SLP Stop Time (ACUTE ONLY): 7741 SLP Time Calculation (min) (ACUTE ONLY): 22 min  Past Medical History:  Past Medical History:  Diagnosis Date   Abdominal wall hernia at previous stoma site    Anemia    CAD (coronary artery disease)    PCI 2003 2.75x13, 2.75x28 Zeta stents mid RCA   CKD (chronic kidney disease), stage III ([place])    Diabetes mellitus    Diverticular disease    HTN (hypertension)    Hyperlipidemia    Hypothyroid    NSTEMI (non-ST elevated myocardial infarction) ([place]) 7/14   DES LAD   SVT (supraventricular tachycardia) ([place])    Thyroid disease    Uterine cancer ([place])    Ventral hernia    Past Surgical History:  Past Surgical History:  Procedure Laterality Date   ABDOMINAL HYSTERECTOMY     COLON SURGERY     colectomy   colostomy reversal.     coronary stents     LEFT HEART CATHETERIZATION WITH CORONARY ANGIOGRAM N/A 01/14/2013   Procedure: LEFT HEART CATHETERIZATION WITH CORONARY ANGIOGRAM;  Surgeon: Sherren Mocha, MD;  Location: [facility] CATH LAB;  Service: Cardiovascular;  Laterality: N/A;   PERCUTANEOUS CORONARY STENT INTERVENTION (PCI-S)  01/14/2013   Procedure: PERCUTANEOUS CORONARY STENT INTERVENTION (PCI-S);  Surgeon: Sherren Mocha, MD;  Location: [facility] CATH LAB;  Service: Cardiovascular;;   HPI:  87 y.o. female with medical history significant for type 2 diabetes mellitus, stage IIIb chronic kidney disease with baseline creatinine 1.6-1.8, hypertension, chronic diastolic heart failure, who is admitted to [facility] on 04/10/2021 with bilateral lower extremity weakness after presenting to [facility] ED via ED to ED transfer from [place] ED, have presenting to the latter facility complaining of bilateral lower extremity weakness. Imaging revealed no abnormalities in the brain but did reveal  degenerative changes to the cervical and thoracic spine. Pt reports history of dysphagia.    Assessment / Plan / Recommendation  Clinical Impression  Pt was seen for a bedside swallow evaluation. Pt and grandson report episodes of coughing during meals across all consistencies intermittently, and pt made NPO during hospital stay after similar event. SLP trialed small and large/consecutive sips of thin liquid from cup and straw, puree, and regular solids. Pt exhibited no oral phase impairments across consistencies but exhbited immediate throat clear with large sips of  thin liquid from straw and delayed throat clear with thin liquid and puree. Pt exhibited no overt s/s of penetration or aspiration with regular solids. SLP will follow up with MBS d/t concerns for penetration/aspiration with thin liquids and pt reports of coughing during mealtimes. Recommend NPO until completion of instrumental study, administering medications whole in puree.  SLP Visit Diagnosis: Dysphagia, unspecified (R13.10)    Aspiration Risk  Moderate aspiration risk    Diet Recommendation NPO   Medication Administration: Whole meds with liquid    Other  Recommendations Oral Care Recommendations: Oral care QID    Recommendations for follow up therapy are one component of a multi-disciplinary discharge planning process, led by the attending physician.  Recommendations may be updated based on patient status, additional functional criteria and insurance authorization.  Follow up Recommendations Other (comment) (tbd)      Frequency and Duration min 1 x/week  1 week       Prognosis Prognosis for Safe Diet Advancement: Good  Swallow Study   General Date of Onset: 04/10/21 HPI: 87 y.o. female with medical history significant for type 2 diabetes mellitus, stage IIIb chronic kidney disease with baseline creatinine 1.6-1.8, hypertension, chronic diastolic heart failure, who is admitted to [facility] on  04/10/2021 with bilateral lower extremity weakness after presenting to [facility] ED via ED to ED transfer from [place] ED, have presenting to the latter facility complaining of bilateral lower extremity weakness. Imaging revealed no abnormalities in the brain but did reveal degenerative changes to the cervical and thoracic spine. Pt reports history of dysphagia. Type of Study: Bedside Swallow Evaluation Previous Swallow Assessment: n/a Diet Prior to this Study: NPO Temperature Spikes Noted: No Respiratory Status: Room air History of Recent Intubation: No Behavior/Cognition: Alert;Cooperative;Pleasant mood Oral Cavity Assessment: Within Functional Limits Oral Care Completed by SLP: No Oral Cavity - Dentition: Adequate natural dentition Vision: Functional for self-feeding Self-Feeding Abilities: Able to feed self Patient Positioning: Upright in bed Baseline Vocal Quality: Low vocal intensity (presbyphonia) Volitional Cough: Weak Volitional Swallow: Able to elicit    Oral/Motor/Sensory Function Overall Oral Motor/Sensory Function: Mild impairment Facial ROM: Within Functional Limits Facial Symmetry: Within Functional Limits Facial Strength: Reduced right;Reduced left Facial Sensation: Within Functional Limits Lingual ROM: Within Functional Limits Lingual Symmetry: Within Functional Limits Lingual Strength: Reduced Lingual Sensation: Within Functional Limits Velum: Within Functional Limits Mandible: Impaired   Ice Chips Ice chips: Not tested   Thin Liquid Thin Liquid: Impaired Presentation: Cup;Spoon Pharyngeal  Phase Impairments: Throat Clearing - Immediate;Throat Clearing - Delayed    Nectar Thick Nectar Thick Liquid: Not tested   Honey Thick Honey Thick Liquid: Not tested   Puree Puree: Impaired Pharyngeal Phase Impairments: Throat Clearing - Delayed   Solid     Solid: Within functional limits     Dewitt Rota, SLP-Student  Eternity Dexter 04/12/2021,10:54 AM

## 2021-04-12 NOTE — Progress Notes (Signed)
Modified Barium Swallow Progress Note  Patient Details  Name: Catherine Mason MRN: [id number] Date of Birth: 1927/10/12  Today's Date: 04/12/2021  Modified Barium Swallow completed.  Full report located under Chart Review in the Imaging Section.  Brief recommendations include the following:  Clinical Impression  Pt was seen for an MBS. Therapist suspects an acute on chronic pharyngeal dysphagia with current illness. Pt's spine suspected to have anatomical differences (osteophytes, ? fusion). Pt diagnosed with degenerative cervical and thoracic spine changes.Overall, pt presents with moderate pharyngeal dysphagia c/b penetration/aspiration with small and large sips of thin liquid, penetration with nectar thick, moderate vallecular residue across consistencies, and mild residue in pyriform sinuses with thin liquid d/t inadequate timing of epiglottal inversion. Penetration also occured after the swallow with thin liquids from spillage from vestibule. Oral phase largely intact across consistencies, however pt reports needing her meat chopped at home. Pharyngeally, thin liquids from cup resulted in penetration and aspiration that did not clear after weak cough/throat clear (trial 1: PAS 4, trial 2: PAS 7). SLP trialed chin tuck technique resulting in a reduction but not elimination of amount penetrated. Large sips of nectar thick liquid penetrated but remained above the level of the vocal folds (PAS 3); pharyngeal phase was largely intact with small sips of nectar thick from straw and solid consistencies. When provided with large pill whole in puree, pill lodged in vallecula and required multiple swallows and initiation of chin tuck to dislodge. SLP provided great granddaughter and pt education re: thickened liquids, aspiration precautions, and potential compensatory strategies. Recommend Dys3/Nectar thick liquid diet d/t pt reports of needing her meat chopped at baseline and safety concerns with thin liquid.  SLP will follow acutely for therapy targeting compensatory swallow strategies and management of diet toleration.   Swallow Evaluation Recommendations       SLP Diet Recommendations: Dysphagia 3 (Mech soft) solids;Nectar thick liquid   Liquid Administration via: Cup;Straw   Medication Administration: Whole meds with puree   Supervision: Full supervision/cueing for compensatory strategies   Compensations: Slow rate;Small sips/bites   Postural Changes: Remain semi-upright after after feeds/meals (Comment);Seated upright at 90 degrees   Oral Care Recommendations: Oral care BID   Other Recommendations: Order thickener from pharmacy    [place] Siren 04/12/2021,1:20 PM

## 2021-04-13 DIAGNOSIS — N1832 Chronic kidney disease, stage 3b: Secondary | ICD-10-CM | POA: Diagnosis not present

## 2021-04-13 DIAGNOSIS — I10 Essential (primary) hypertension: Secondary | ICD-10-CM | POA: Diagnosis not present

## 2021-04-13 DIAGNOSIS — E119 Type 2 diabetes mellitus without complications: Secondary | ICD-10-CM | POA: Diagnosis not present

## 2021-04-13 DIAGNOSIS — E1140 Type 2 diabetes mellitus with diabetic neuropathy, unspecified: Secondary | ICD-10-CM

## 2021-04-13 DIAGNOSIS — R29898 Other symptoms and signs involving the musculoskeletal system: Secondary | ICD-10-CM | POA: Diagnosis not present

## 2021-04-13 LAB — CSF CELL COUNT WITH DIFFERENTIAL
Eosinophils, CSF: 2 % — ABNORMAL HIGH (ref 0–1)
Lymphs, CSF: 28 % — ABNORMAL LOW (ref 40–80)
Monocyte-Macrophage-Spinal Fluid: 0 % — ABNORMAL LOW (ref 15–45)
RBC Count, CSF: 2780 /mm3 — ABNORMAL HIGH
Segmented Neutrophils-CSF: 70 % — ABNORMAL HIGH (ref 0–6)
Tube #: 1
Tube #: 4
WBC, CSF: 4 /mm3 (ref 0–5)

## 2021-04-13 LAB — BASIC METABOLIC PANEL
Anion gap: 7 (ref 5–15)
BUN: 30 mg/dL — ABNORMAL HIGH (ref 8–23)
CO2: 18 mmol/L — ABNORMAL LOW (ref 22–32)
Calcium: 8.3 mg/dL — ABNORMAL LOW (ref 8.9–10.3)
Chloride: 110 mmol/L (ref 98–111)
Creatinine, Ser: 1.79 mg/dL — ABNORMAL HIGH (ref 0.44–1.00)
GFR, Estimated: 26 mL/min — ABNORMAL LOW (ref >60)
Glucose, Bld: 175 mg/dL — ABNORMAL HIGH (ref 70–99)
Potassium: 4.4 mmol/L (ref 3.5–5.1)
Sodium: 135 mmol/L (ref 135–145)

## 2021-04-13 LAB — GLUCOSE, CAPILLARY
Glucose-Capillary: 142 mg/dL — ABNORMAL HIGH (ref 70–99)
Glucose-Capillary: 207 mg/dL — ABNORMAL HIGH (ref 70–99)
Glucose-Capillary: 209 mg/dL — ABNORMAL HIGH (ref 70–99)
Glucose-Capillary: 225 mg/dL — ABNORMAL HIGH (ref 70–99)
Glucose-Capillary: 259 mg/dL — ABNORMAL HIGH (ref 70–99)
Glucose-Capillary: 297 mg/dL — ABNORMAL HIGH (ref 70–99)

## 2021-04-13 LAB — PROTEIN AND GLUCOSE, CSF
Glucose, CSF: 117 mg/dL — ABNORMAL HIGH (ref 40–70)
Total  Protein, CSF: 44 mg/dL (ref 15–45)

## 2021-04-13 MED ORDER — INSULIN DETEMIR 100 UNIT/ML ~~LOC~~ SOLN
4.0000 [IU] | Freq: Two times a day (BID) | SUBCUTANEOUS | Status: DC
  Administered 2021-04-13 – 2021-04-16 (×8): 4 [IU] via SUBCUTANEOUS
  Filled 2021-04-13 (×10): qty 0.04

## 2021-04-13 NOTE — Progress Notes (Signed)
Inpatient Rehabilitation Admissions Coordinator   Inpatient rehab consult received. I met with patient at bedside for assessment. She is aware that a rehab venue needs to be pursued prior to her returning home . I will contact her Grandson to further assess caregiver supports .  Danne Baxter, RN, MSN Rehab Admissions Coordinator [phone] 04/13/2021 3:45 PM

## 2021-04-13 NOTE — Procedures (Signed)
LUMBAR PUNCTURE (SPINAL TAP) PROCEDURE NOTE  Indication: CIDP/AIDP vs. Diabetic amyotrophy.    Proceduralists: Lynnae Sandhoff, MD.                           Clance Boll, NP   Risks of the procedure were dicussed with the patient including post-LP headache, bleeding, infection, weakness/numbness of legs(radiculopathy), death.    Consent obtained from: patient.    Procedure Note The patient was prepped and draped, and using sterile technique a 20 gauge quinke spinal needle was inserted in the L3-4 space.    Approximately 8cc of CSF were obtained and sent for analysis.  Patient tolerated the procedure well and blood loss was minimal.  Clance Boll, MSN, APN-BC Neurology Nurse Practitioner Pager [phone]

## 2021-04-13 NOTE — Progress Notes (Signed)
Physical Therapy Treatment Patient Details Name: Catherine Mason MRN: [id number] DOB: [birth year] Today's Date: 04/13/2021   History of Present Illness Pt is a 87 y/o female who presents with bilateral lower extremity weakness after presenting to [facility] ED via ED-to-ED transfer from [facility]. Imaging revealed no abnormalities in the brain but did reveal degenerative changes to the cervical and thoracic spine. PMH significant for type 2 diabetes mellitus, stage IIIb chronic kidney disease with baseline creatinine 1.6-1.8, HTN, chronic diastolic heart failure, dysphagia.    PT Comments    Pt reports increased pain in her R ankle today, and noted it is swollen and tender to palpation and PROM. Ice applied at end of session and RN notified. Pt limited by ankle pain during functional mobility, and continues to feel generally weak. +2 required throughout session for safety and completion of OOB to [facility] and then recliner. Recommend bari stedy as it was difficult for pt to extend hips fully to clear the flaps of the standard stedy. If pt does not show improved tolerance for functional activity in next session, may want to consider SNF level rehab instead of CIR. Will continue to follow.    Recommendations for follow up therapy are one component of a multi-disciplinary discharge planning process, led by the attending physician.  Recommendations may be updated based on patient status, additional functional criteria and insurance authorization.  Follow Up Recommendations  CIR     Equipment Recommendations  Other (comment) (TBD by next venue of care)    Recommendations for Other Services Rehab consult     Precautions / Restrictions Precautions Precautions: Fall Precaution Comments: Painful and swollen R ankle Restrictions Weight Bearing Restrictions: No     Mobility  Bed Mobility Overal bed mobility: Needs Assistance Bed Mobility: Rolling;Sidelying to Sit Rolling: Max assist Sidelying to sit: Mod  assist;HOB elevated       General bed mobility comments: VC's to reach for railing, and bed pad utilized for complete roll to the L. Assist for trunk elevation to full sitting position.    Transfers Overall transfer level: Needs assistance Equipment used: 2 person hand held assist Transfers: Sit to/from [employer] Sit to Stand: Max assist;From elevated surface;+2 physical assistance;+2 safety/equipment   Squat pivot transfers: Max assist;From elevated surface;+2 physical assistance;+2 safety/equipment     General transfer comment: +2 assist required for transition from bed to [facility]. Stedy utilized for standing to perform peri-care (total assist required), and transition to recliner. Pt reporting difficulty accepting weight onto the RLE due to pain in her ankle.  Ambulation/Gait             General Gait Details: Unable to assess this session   Stairs             Wheelchair Mobility    Modified Rankin (Stroke Patients Only)       Balance Overall balance assessment: Needs assistance Sitting-balance support: Feet supported;Bilateral upper extremity supported Sitting balance-Leahy Scale: Poor Sitting balance - Comments: Reliant on at least 1 UE support. Postural control: Posterior lean;Left lateral lean Standing balance support: Bilateral upper extremity supported;During functional activity Standing balance-Leahy Scale: Zero Standing balance comment: +2 assist required.                            Cognition Arousal/Alertness: Awake/alert Behavior During Therapy: Flat affect Overall Cognitive Status: Within Functional Limits for tasks assessed  Exercises      General Comments        Pertinent Vitals/Pain Pain Assessment: Faces Faces Pain Scale: Hurts little more Pain Location: R ankle Pain Descriptors / Indicators: Sore;Sharp Pain Intervention(s): Limited activity within  patient's tolerance;Monitored during session;Repositioned;Ice applied    Home Living                      Prior Function            PT Goals (current goals can now be found in the care plan section) Acute Rehab PT Goals Patient Stated Goal: Be able to walk again PT Goal Formulation: With patient/family Time For Goal Achievement: 04/19/21 Potential to Achieve Goals: Good Progress towards PT goals: Progressing toward goals    Frequency    Min 3X/week      PT Plan Current plan remains appropriate    Co-evaluation              AM-PAC PT "6 Clicks" Mobility   Outcome Measure  Help needed turning from your back to your side while in a flat bed without using bedrails?: A Lot Help needed moving from lying on your back to sitting on the side of a flat bed without using bedrails?: A Lot Help needed moving to and from a bed to a chair (including a wheelchair)?: Total Help needed standing up from a chair using your arms (e.g., wheelchair or bedside chair)?: Total Help needed to walk in hospital room?: Total Help needed climbing 3-5 steps with a railing? : Total 6 Click Score: 8    End of Session Equipment Utilized During Treatment: Gait belt Activity Tolerance: Patient limited by fatigue (weakness) Patient left: in chair;with call bell/phone within reach;with chair alarm set Nurse Communication: Mobility status;Need for lift equipment;Other (comment) Judie Petit Stedy better than regular Stedy; R ankle swelling and ice applied) PT Visit Diagnosis: Unsteadiness on feet (R26.81);Muscle weakness (generalized) (M62.81);Difficulty in walking, not elsewhere classified (R26.2)     Time: 1610-9604 PT Time Calculation (min) (ACUTE ONLY): 40 min  Charges:  $Gait Training: 23-37 mins                     Rolinda Roan, PT, DPT Acute Rehabilitation Services Pager: [phone] Office: [phone]    Thelma Comp 04/13/2021, 3:50 PM

## 2021-04-13 NOTE — Progress Notes (Signed)
Inpatient Rehab Admissions Coordinator Note:   Per PT recommendations patient was screened for CIR candidacy by Michel Santee, PT. At this time, pt appears to be a potential candidate for CIR. I will place an order for rehab consult for full assessment, per our protocol.  Please contact me any with questions.Shann Medal, PT, DPT [phone] 04/13/21 10:03 AM 3

## 2021-04-13 NOTE — Progress Notes (Addendum)
[facility] PROGRESS NOTE    Progress Note  Catherine RUPPERT  ZWC:[id number] DOB: Apr 03, 1928 DOA: 04/10/2021 PCP: Catherine Bowen, MD     Brief Narrative:   Catherine Mason is an 87 y.o. female past medical history significant for diabetes mellitus type 2 chronic kidney disease stage IIIb creatinine 1.6-1.8, essential hypertension chronic diastolic heart failure admitted to [facility] on 04/10/2021 for bilateral lower extremity weakness and relates that Friday she was ambulatory but the next day she could not get up, she relates she has been feeling weak for several weeks prior to admission.  MRI of the whole spine showed no acute findings neurology was consulted.  Significant studies: MRI of the brain showed no stroke. MRI of the lumbar spine shows severe osteoarthritis.  With mild neuroforaminal stenosis at L1-L2 L2-L3 and L4-L5 MRI of the cervical C-spine showed multiple spinal level canal stenosis most notably and C5-C6 MRI of the thoracic spine showed no myelopathy or spinal cord dysfunction no evidence of impingement. Antibiotics: None  Microbiology data: Blood culture:  Procedures: None  Assessment/Plan:   Subacute lower extremity weakness: Differential is broad including diabetic mild trophy, paraneoplastic syndrome and CIDP. She denies any pain which makes diabetic angiography unlikely. Neurology was consulted recommended an MRI of the pelvis without contrast to rule out infiltrative process, which were negative.  Except for osteoarthritis CT of the chest and abdomen that showed fibrosis of lung no consolidation pancreatic calcification bilateral renal atrophy abdominal wall hernia without obstruction and aortic atherosclerosis negative for malignancy. Statins were stopped, CRP was 1.0 sed rate of 39. Neurology recommended an LP which is scheduled for today.  Non-anion gap metabolic acidosis: Associated with hyperchloremia.  Start her on half-normal saline  hyperchloremia has resolved her bicarb this morning is 18.  Questionable RTA type IV and poorly controlled diabetic  Chronic kidney disease stage IIIb: Unknown baseline on admission 1.8. She started on IV fluids and her creatinine improved to 1.5, she is ranging around 1.6-1.8.  Diabetes mellitus type II uncontrolled: With an A1c of 9.0 she has required minimal insulin, will change to very sensitive sliding scale insulin.  Essential  HTN (hypertension) Continue current home regimen of Norvasc lisinopril and Toprol.  Her blood pressure seems to be well controlled.  Acquired hypothyroidism: Continue current home dose of Synthroid.  Chronic diastolic heart failure: Monitor strict I's and O's continue current regimen, except for diuretics which we will hold.  Monitor electrolytes.  Diabetes mellitus type 2 with hyperglycemia: A1C of 9 she was started on low-dose long-acting insulin continue sliding scale.   DVT prophylaxis: lovenox Family Communication: Son Status is: Inpatient  Remains inpatient appropriate because:Hemodynamically unstable  Dispo: The patient is from: Home              Anticipated d/c is to: SNF              Patient currently is not medically stable to d/c.   Difficult to place patient No     Code Status:     Code Status Orders  (From admission, onward)           Start     Ordered   04/11/21 0147  Full code  Continuous        04/11/21 0147           Code Status History     This patient has a current code status but no historical code status.      Advance Directive Documentation  Flowsheet Row Most Recent Value  Type of Advance Directive Living will  Pre-existing out of facility DNR order (yellow form or pink MOST form) --  "MOST" Form in Place? --         IV Access:   Peripheral IV   Procedures and diagnostic studies:   MR CERVICAL SPINE W WO CONTRAST  Result Date: 04/11/2021 CLINICAL DATA:  Myelopathy, acute or  progressive. EXAM: MRI CERVICAL SPINE WITHOUT AND WITH CONTRAST TECHNIQUE: Multiplanar and multiecho pulse sequences of the cervical spine, to include the craniocervical junction and cervicothoracic junction, were obtained without and with intravenous contrast. CONTRAST:  31mL GADAVIST GADOBUTROL 1 MMOL/ML IV SOLN COMPARISON:  None FINDINGS: Mildly motion degraded exam. Alignment: Straightening of the expected cervical lordosis. No significant spondylolisthesis. Vertebrae: Vertebral body height is maintained. Multilevel degenerative endplate irregularity. No significant marrow edema or focal suspicious osseous lesion. Cord: No signal abnormality identified within the cervical spinal cord. Posterior Fossa, vertebral arteries, paraspinal tissues: Cerebellar atrophy. Flow voids preserved within the imaged cervical vertebral arteries. Paraspinal soft tissues unremarkable. Bilateral mastoid effusions. Disc levels: Multilevel disc degeneration. Most notably, there is advanced disc degeneration at C4-C5, C5-C6, C6-C7 and T1-T2. C2-C3: Shallow disc bulge with bilateral uncovertebral hypertrophy. No significant spinal canal or foraminal stenosis. C3-C4: Shallow disc bulge with bilateral uncovertebral hypertrophy. Facet arthrosis and ligamentum flavum hypertrophy. Mild relative spinal canal narrowing (without spinal cord mass effect). Bilateral neural foraminal narrowing (mild right, moderate left). C4-C5: Posterior disc osteophyte complex with bilateral disc osteophyte ridge/uncinate hypertrophy. Facet arthrosis and ligamentum flavum hypertrophy. Mild relative spinal canal narrowing (without spinal cord mass effect). Bilateral neural foraminal narrowing (moderate/severe right, mild left). C5-C6: Posterior disc osteophyte complex with bilateral disc osteophyte ridge/uncinate hypertrophy. Facet arthrosis and ligamentum flavum hypertrophy. Mild/moderate spinal canal stenosis with contact upon the dorsal and ventral spinal cord.  Bilateral neural foraminal narrowing (moderate/severe right, mild left). C6-C7: Disc bulge with bilateral disc osteophyte ridge/uncinate hypertrophy. Minimal partial effacement of the ventral thecal sac (without spinal cord mass effect). Mild/moderate right neural foraminal narrowing. C7-T1: Shallow disc bulge. Minimal facet arthrosis. No significant spinal canal or foraminal stenosis. IMPRESSION: Mildly motion degraded exam. Within the limitations of motion degradation, no spinal cord signal abnormality is identified. Cervical spondylosis, as outlined. Multilevel spinal canal stenosis. Most notably, there is multifactorial mild/moderate spinal canal stenosis at C5-C6. Multilevel foraminal stenosis, as detailed and greatest on the left at C3-C4 (moderate), on the right at C4-C5 (moderate/severe), and on the right at C5-C6 (moderate/severe). Disc degeneration is greatest (severe) at C4-C5, C5-C6, C6-C7 and T1-T2. Electronically Signed   By: Kellie Simmering D.O.   On: 04/11/2021 15:43   MR THORACIC SPINE W WO CONTRAST  Result Date: 04/11/2021 CLINICAL DATA:  Progressive myelopathy. EXAM: MRI THORACIC WITHOUT AND WITH CONTRAST TECHNIQUE: Multiplanar and multiecho pulse sequences of the thoracic spine were obtained without and with intravenous contrast. CONTRAST:  54mL GADAVIST GADOBUTROL 1 MMOL/ML IV SOLN COMPARISON:  None. FINDINGS: Alignment:  2 mm retrolisthesis of L1 on L2 and L2 on L3. Vertebrae: No fracture, evidence of discitis, or bone lesion. Cord:  Normal signal and morphology. Paraspinal and other soft tissues: Negative. Disc levels: Disc spaces: Degenerative disease with disc height loss at T1-2. Disc desiccation throughout the remainder of the thoracic spine. T1-T2: Mild broad-based disc bulge. No foraminal or central canal stenosis. T2-T3: No disc protrusion, foraminal stenosis or central canal stenosis. T3-T4: No disc protrusion, foraminal stenosis or central canal stenosis. T4-T5: No disc protrusion,  foraminal stenosis  or central canal stenosis. T5-T6: Minimal broad-based disc bulge. No foraminal or central canal stenosis. T6-T7: No disc protrusion, foraminal stenosis or central canal stenosis. T7-T8: No disc protrusion, foraminal stenosis or central canal stenosis. T8-T9: No disc protrusion, foraminal stenosis or central canal stenosis. T9-T10: No disc protrusion, foraminal stenosis or central canal stenosis. T10-T11: Minimal broad-based disc bulge. Mild bilateral facet arthropathy. No foraminal or central canal stenosis. T11-T12: Minimal broad-based disc bulge. No foraminal or central canal stenosis. T12-L1: Mild broad-based disc bulge. No foraminal or central canal stenosis. L1-2: Mild broad-based disc bulge. Moderate bilateral facet arthropathy. Mild left foraminal stenosis. Mild spinal stenosis. L2-3: Broad-based disc osteophyte complex. Moderate bilateral facet arthropathy. IMPRESSION: 1.  No acute osseous injury of the thoracic spine. 2. No myelopathy of the thoracic spinal cord. 3. No evidence of nerve root impingement of the thoracic spine. Electronically Signed   By: Kathreen Devoid M.D.   On: 04/11/2021 15:47   DG Swallowing Func-Speech Pathology  Result Date: 04/12/2021 Table formatting from the original result was not included. Objective Swallowing Evaluation: Type of Study: MBS-Modified Barium Swallow Study  Patient Details Name: CANDYCE GAMBINO MRN: [id number] Date of Birth: 06/20/1928 Today's Date: 04/12/2021 Time: SLP Start Time (ACUTE ONLY): 1000 -SLP Stop Time (ACUTE ONLY): 2725 SLP Time Calculation (min) (ACUTE ONLY): 29 min Past Medical History: Past Medical History: Diagnosis Date  Abdominal wall hernia at previous stoma site   Anemia   CAD (coronary artery disease)   PCI 2003 2.75x13, 2.75x28 Zeta stents mid RCA  CKD (chronic kidney disease), stage III ([place])   Diabetes mellitus   Diverticular disease   HTN (hypertension)   Hyperlipidemia   Hypothyroid   NSTEMI (non-ST elevated myocardial  infarction) ([place]) 7/14  DES LAD  SVT (supraventricular tachycardia) ([place])   Thyroid disease   Uterine cancer ([place])   Ventral hernia  Past Surgical History: Past Surgical History: Procedure Laterality Date  ABDOMINAL HYSTERECTOMY    COLON SURGERY    colectomy  colostomy reversal.    coronary stents    LEFT HEART CATHETERIZATION WITH CORONARY ANGIOGRAM N/A 01/14/2013  Procedure: LEFT HEART CATHETERIZATION WITH CORONARY ANGIOGRAM;  Surgeon: Sherren Mocha, MD;  Location: [facility] CATH LAB;  Service: Cardiovascular;  Laterality: N/A;  PERCUTANEOUS CORONARY STENT INTERVENTION (PCI-S)  01/14/2013  Procedure: PERCUTANEOUS CORONARY STENT INTERVENTION (PCI-S);  Surgeon: Sherren Mocha, MD;  Location: [facility] CATH LAB;  Service: Cardiovascular;; HPI: 87 y.o. female with medical history significant for type 2 diabetes mellitus, stage IIIb chronic kidney disease with baseline creatinine 1.6-1.8, hypertension, chronic diastolic heart failure, who is admitted to [facility] on 04/10/2021 with bilateral lower extremity weakness after presenting to [facility] ED via ED to ED transfer from [place] ED, have presenting to the latter facility complaining of bilateral lower extremity weakness. Imaging revealed no abnormalities in the brain but did reveal degenerative changes to the cervical and thoracic spine. Pt reports history of dysphagia.  Subjective: Pt awake and alert with great granddaughter present for testing. Assessment / Plan / Recommendation CHL IP CLINICAL IMPRESSIONS 04/12/2021 Clinical Impression Pt was seen for an MBS. Therapist suspects a chronic on acute pharyngeal dysphagia with current illness. Pt's spine suspected to have anatomical differences (osteophytes, ? fusion). Pt diagnosed with degenerative cervical and thoracic spine changes.Overall, pt presents with moderate pharyngeal dysphagia c/b penetration/aspiration with small and large sips of thin liquid, penetration with nectar thick, moderate vallecular residue across  consistencies, and mild residue in pyriform sinuses with thin liquid d/t inadequate timing of  epiglottal inversion. Penetration also occured after the swallow with thin liquids from spillage from vestibule. Oral phase largely intact across consistencies, however pt reports needing her meat chopped at home. Pharyngeally, thin liquids from cup resulted in penetration and aspiration that did not clear after weak cough/throat clear (trial 1: PAS 4, trial 2: PAS 7). SLP trialed chin tuck technique resulting in a reduction but not elimination of amount penetrated. Large sips of nectar thick liquid penetrated but remained above the level of the vocal folds (PAS 3); pharyngeal phase was largely intact with small sips of nectar thick from straw and solid consistencies. When provided with large pill whole in puree, pill lodged in vallecula and required multiple swallows and initiation of chin tuck to dislodge. SLP provided great granddaughter and pt education re: thickened liquids, aspiration precautions, and potential compensatory strategies. Recommend Dys3/Nectar thick liquid diet d/t pt reports of needing her meat chopped at baseline and safety concerns with thin liquid. SLP will follow acutely for therapy targeting compensatory swallow strategies and management of diet toleration. SLP Visit Diagnosis Dysphagia, pharyngeal phase (R13.13) Attention and concentration deficit following -- Frontal lobe and executive function deficit following -- Impact on safety and function Severe aspiration risk   CHL IP TREATMENT RECOMMENDATION 04/12/2021 Treatment Recommendations Therapy as outlined in treatment plan below   Prognosis 04/12/2021 Prognosis for Safe Diet Advancement Good Barriers to Reach Goals Time post onset Barriers/Prognosis Comment -- CHL IP DIET RECOMMENDATION 04/12/2021 SLP Diet Recommendations Dysphagia 3 (Mech soft) solids;Nectar thick liquid Liquid Administration via Cup;Straw Medication Administration Whole meds  with puree Compensations Slow rate;Small sips/bites Postural Changes Remain semi-upright after after feeds/meals (Comment);Seated upright at 90 degrees   CHL IP OTHER RECOMMENDATIONS 04/12/2021 Recommended Consults -- Oral Care Recommendations Oral care BID Other Recommendations Order thickener from pharmacy   CHL IP FOLLOW UP RECOMMENDATIONS 04/12/2021 Follow up Recommendations Other (comment)   CHL IP FREQUENCY AND DURATION 04/12/2021 Speech Therapy Frequency (ACUTE ONLY) min 2x/week Treatment Duration 2 weeks      CHL IP ORAL PHASE 04/12/2021 Oral Phase WFL Oral - Pudding Teaspoon -- Oral - Pudding Cup -- Oral - Honey Teaspoon -- Oral - Honey Cup -- Oral - Nectar Teaspoon -- Oral - Nectar Cup -- Oral - Nectar Straw -- Oral - Thin Teaspoon -- Oral - Thin Cup -- Oral - Thin Straw -- Oral - Puree -- Oral - Mech Soft -- Oral - Regular -- Oral - Multi-Consistency -- Oral - Pill -- Oral Phase - Comment --  CHL IP PHARYNGEAL PHASE 04/12/2021 Pharyngeal Phase Impaired Pharyngeal- Pudding Teaspoon -- Pharyngeal -- Pharyngeal- Pudding Cup -- Pharyngeal -- Pharyngeal- Honey Teaspoon -- Pharyngeal -- Pharyngeal- Honey Cup -- Pharyngeal -- Pharyngeal- Nectar Teaspoon -- Pharyngeal -- Pharyngeal- Nectar Cup Reduced epiglottic inversion;Reduced anterior laryngeal mobility;Pharyngeal residue - valleculae Pharyngeal Material does not enter airway Pharyngeal- Nectar Straw Penetration/Aspiration during swallow;Reduced anterior laryngeal mobility;Reduced epiglottic inversion;Pharyngeal residue - valleculae Pharyngeal Material enters airway, remains ABOVE vocal cords then ejected out Pharyngeal- Thin Teaspoon -- Pharyngeal -- Pharyngeal- Thin Cup Penetration/Aspiration during swallow;Pharyngeal residue - valleculae;Reduced epiglottic inversion;Trace aspiration;Compensatory strategies attempted (with notebox);Penetration/Apiration after swallow;Pharyngeal residue - pyriform Pharyngeal Material enters airway, CONTACTS cords and not  ejected out;Material enters airway, passes BELOW cords and not ejected out despite cough attempt by patient Pharyngeal- Thin Straw NT Pharyngeal -- Pharyngeal- Puree Pharyngeal residue - valleculae Pharyngeal Material does not enter airway Pharyngeal- Mechanical Soft -- Pharyngeal -- Pharyngeal- Regular Pharyngeal residue - valleculae Pharyngeal Material does not enter airway  Pharyngeal- Multi-consistency -- Pharyngeal -- Pharyngeal- Pill Pharyngeal residue - valleculae Pharyngeal Material does not enter airway Pharyngeal Comment --  CHL IP CERVICAL ESOPHAGEAL PHASE 04/12/2021 Cervical Esophageal Phase WFL Pudding Teaspoon -- Pudding Cup -- Honey Teaspoon -- Honey Cup -- Nectar Teaspoon -- Nectar Cup -- Nectar Straw -- Thin Teaspoon -- Thin Cup -- Thin Straw -- Puree -- Mechanical Soft -- Regular -- Multi-consistency -- Pill -- Cervical Esophageal Comment -- [place] Siren 04/12/2021, 1:04 PM Orbie Pyo Colvin Caroli.Ed Actor Pager [phone] Office [phone] `              CT CHEST ABDOMEN PELVIS WO CONTRAST  Result Date: 04/12/2021 CLINICAL DATA:  Cranial neuropathy.  Weakness. EXAM: CT CHEST, ABDOMEN AND PELVIS WITHOUT CONTRAST TECHNIQUE: Multidetector CT imaging of the chest, abdomen and pelvis was performed following the standard protocol without IV contrast. COMPARISON:  11/05/2009 FINDINGS: CT CHEST FINDINGS Cardiovascular: Normal heart size. No pericardial effusions. Coronary artery calcifications. Probable coronary stent. Normal caliber thoracic aorta with scattered calcification. Mediastinum/Nodes: Esophagus is decompressed. No significant lymphadenopathy. Thyroid gland is unremarkable. Lungs/Pleura: Slight fibrosis in the lung periphery and base, suggesting early usual interstitial pneumonitis. Mild traction bronchiectasis in the bases. No focal consolidation. No pleural effusions. No pneumothorax. Musculoskeletal: Prominent degenerative changes throughout the thoracic  spine with bridging osteophyte formation. Mild thoracolumbar scoliosis convex towards the right. CT ABDOMEN PELVIS FINDINGS Hepatobiliary: Small low-attenuation lesion in the right lobe of the liver probably represents a cyst. Gallbladder is moderately distended but without wall thickening or stone. No bile duct dilatation. Pancreas: Pancreas is atrophic. Scattered calcifications throughout the pancreas consistent with sequela of chronic pancreatitis. No acute inflammatory changes. Spleen: Normal in size without focal abnormality. Adrenals/Urinary Tract: No adrenal gland nodules. Bilateral renal parenchymal atrophy. No hydronephrosis or hydroureter. No renal or ureteral stones. Bladder wall is mildly thickened suggesting possible cystitis. Correlate with urinalysis. Stomach/Bowel: Stomach, small bowel, and colon are not abnormally distended. There is a broad-based anterior abdominal wall hernia containing small bowel and transverse colon. No proximal obstruction or wall thickening is appreciated. Prominent stool in the rectosigmoid colon. Appendix is not identified. Vascular/Lymphatic: Aortic atherosclerosis. No enlarged abdominal or pelvic lymph nodes. Reproductive: Status post hysterectomy. No adnexal masses. Other: No free air or free fluid in the abdomen. Musculoskeletal: Degenerative changes in the lumbar spine. Mild thoracolumbar scoliosis convex towards the right. Sclerosis and lucency in the superior surface of the femoral heads suggest possible avascular necrosis. IMPRESSION: 1. Slight fibrosis in the lung periphery and bases. Mild traction bronchiectasis. No focal consolidation. 2. Diffuse pancreatic calcification consistent with chronic pancreatitis. No acute changes. 3. Bilateral renal atrophy. 4. Broad-based anterior abdominal wall hernia containing bowel and transverse colon but without proximal obstruction. 5. Aortic atherosclerosis. Electronically Signed   By: Lucienne Capers M.D.   On: 04/12/2021  01:50     Medical Consultants:   None.   Subjective:    [name] tolerating her diet has no new complaints.  Objective:    Vitals:   04/12/21 2023 04/13/21 0014 04/13/21 0415 04/13/21 0745  BP: (!) 137/58 120/63 (!) 117/46 124/60  Pulse: 84 87 80 80  Resp: 20 16 18 17   Temp: 98.5 F (36.9 C) 97.7 F (36.5 C) 97.8 F (36.6 C) 98.3 F (36.8 C)  TempSrc: Oral Oral Oral   SpO2: 100% 97% 96% 96%  Weight:   66.8 kg   Height:       SpO2: 96 %   Intake/Output  Summary (Last 24 hours) at 04/13/2021 0830 Last data filed at 04/13/2021 0300 Gross per 24 hour  Intake 1556.88 ml  Output 900 ml  Net 656.88 ml    Filed Weights   04/11/21 1700 04/12/21 0343 04/13/21 0415  Weight: 63.5 kg 66.3 kg 66.8 kg    Exam: General exam: In no acute distress. Respiratory system: Good air movement and clear to auscultation. Cardiovascular system: S1 & S2 heard, RRR. No JVD. Gastrointestinal system: Abdomen is nondistended, soft and nontender.  Extremities: No pedal edema. Skin: No rashes, lesions or ulcers Psychiatry: Judgement and insight appear normal. Mood & affect appropriate.   Data Reviewed:    Labs: Basic Metabolic Panel: Recent Labs  Lab 04/10/21 1457 04/10/21 1513 04/11/21 0507 04/12/21 1040 04/13/21 0305  NA 138 142 139 137 135  K 5.0 4.8 4.9 4.9 4.4  CL 112* 113* 113* 108 110  CO2 18*  --  19* 20* 18*  GLUCOSE 159* 158* 206* 218* 175*  BUN 33* 31* 27* 27* 30*  CREATININE 1.80* 1.80* 1.57* 1.66* 1.79*  CALCIUM 8.7*  --  8.8* 8.9 8.3*  MG  --   --  2.1  --   --     GFR Estimated Creatinine Clearance: 19.2 mL/min (A) (by C-G formula based on SCr of 1.79 mg/dL (H)). Liver Function Tests: Recent Labs  Lab 04/10/21 1457 04/11/21 0507  AST 25 18  ALT 23 17  ALKPHOS 156* 128*  BILITOT 0.9 0.8  PROT 7.4 6.2*  ALBUMIN 3.8 2.9*    No results for input(s): LIPASE, AMYLASE in the last 168 hours. No results for input(s): AMMONIA in the last 168  hours. Coagulation profile Recent Labs  Lab 04/10/21 1457  INR 1.0    COVID-19 Labs  Recent Labs    04/11/21 1651  CRP 1.0*     Lab Results  Component Value Date   SARSCOV2NAA NEGATIVE 04/10/2021    CBC: Recent Labs  Lab 04/10/21 1513 04/10/21 1558 04/11/21 0507  WBC  --  6.8 6.4  NEUTROABS  --  3.6  --   HGB 13.6 12.3 12.3  HCT 40.0 40.0 38.3  MCV  --  98.8 94.8  PLT  --  153 160    Cardiac Enzymes: Recent Labs  Lab 04/11/21 1651  CKTOTAL 36*    BNP (last 3 results) No results for input(s): PROBNP in the last 8760 hours. CBG: Recent Labs  Lab 04/12/21 1157 04/12/21 1637 04/12/21 2231 04/13/21 0615 04/13/21 0809  GLUCAP 229* 259* 287* 142* 207*    D-Dimer: No results for input(s): DDIMER in the last 72 hours. Hgb A1c: Recent Labs    04/11/21 1651  HGBA1C 9.0*    Lipid Profile: No results for input(s): CHOL, HDL, LDLCALC, TRIG, CHOLHDL, LDLDIRECT in the last 72 hours. Thyroid function studies: No results for input(s): TSH, T4TOTAL, T3FREE, THYROIDAB in the last 72 hours.  Invalid input(s): FREET3 Anemia work up: No results for input(s): VITAMINB12, FOLATE, FERRITIN, TIBC, IRON, RETICCTPCT in the last 72 hours. Sepsis Labs: Recent Labs  Lab 04/10/21 1558 04/11/21 0507  WBC 6.8 6.4    Microbiology Recent Results (from the past 240 hour(s))  Resp Panel by RT-PCR (Flu A&B, Covid) Nasopharyngeal Swab     Status: None   Collection Time: 04/10/21  2:54 PM   Specimen: Nasopharyngeal Swab; Nasopharyngeal(NP) swabs in vial transport medium  Result Value Ref Range Status   SARS Coronavirus 2 by RT PCR NEGATIVE NEGATIVE Final  Comment: (NOTE) SARS-CoV-2 target nucleic acids are NOT DETECTED.  The SARS-CoV-2 RNA is generally detectable in upper respiratory specimens during the acute phase of infection. The lowest concentration of SARS-CoV-2 viral copies this assay can detect is 138 copies/mL. A negative result does not preclude  SARS-Cov-2 infection and should not be used as the sole basis for treatment or other patient management decisions. A negative result may occur with  improper specimen collection/handling, submission of specimen other than nasopharyngeal swab, presence of viral mutation(s) within the areas targeted by this assay, and inadequate number of viral copies(<138 copies/mL). A negative result must be combined with clinical observations, patient history, and epidemiological information. The expected result is Negative.  Fact Sheet for Patients:  EntrepreneurPulse.com.au  Fact Sheet for Healthcare Providers:  IncredibleEmployment.be  This test is no t yet approved or cleared by the [place] FDA and  has been authorized for detection and/or diagnosis of SARS-CoV-2 by FDA under an Emergency Use Authorization (EUA). This EUA will remain  in effect (meaning this test can be used) for the duration of the COVID-19 declaration under Section 564(b)(1) of the Act, 21 U.S.C.section 360bbb-3(b)(1), unless the authorization is terminated  or revoked sooner.       Influenza A by PCR NEGATIVE NEGATIVE Final   Influenza B by PCR NEGATIVE NEGATIVE Final    Comment: (NOTE) The Xpert Xpress SARS-CoV-2/FLU/RSV plus assay is intended as an aid in the diagnosis of influenza from Nasopharyngeal swab specimens and should not be used as a sole basis for treatment. Nasal washings and aspirates are unacceptable for Xpert Xpress SARS-CoV-2/FLU/RSV testing.  Fact Sheet for Patients: EntrepreneurPulse.com.au  Fact Sheet for Healthcare Providers: IncredibleEmployment.be  This test is not yet approved or cleared by the [place] FDA and has been authorized for detection and/or diagnosis of SARS-CoV-2 by FDA under an Emergency Use Authorization (EUA). This EUA will remain in effect (meaning this test can be used) for the duration of  the COVID-19 declaration under Section 564(b)(1) of the Act, 21 U.S.C. section 360bbb-3(b)(1), unless the authorization is terminated or revoked.  Performed at [facility], [address], [place], [place] [address]      Medications:    amLODipine  5 mg Oral Daily   insulin aspart  0-5 Units Subcutaneous QHS   insulin aspart  0-6 Units Subcutaneous TID WC   insulin aspart  2 Units Subcutaneous TID WC   levothyroxine  50 mcg Oral QAC breakfast   metoprolol succinate  50 mg Oral BID   Continuous Infusions:  sodium chloride 75 mL/hr at 04/12/21 2253      LOS: 2 days   Charlynne Cousins  [facility]  04/13/2021, 8:30 AM

## 2021-04-13 NOTE — Progress Notes (Signed)
Speech Language Pathology Treatment: Dysphagia  Patient Details Name: NEETU CARROZZA MRN: [id number] DOB: 1928/02/17 Today's Date: 04/13/2021 Time: 0086-7619 SLP Time Calculation (min) (ACUTE ONLY): 24 min  Assessment / Plan / Recommendation Clinical Impression  Pt was seen for therapy targeting swallowing goals. Pt and grandson report intermittent throat clearing during breakfast. SLP observed pt with nectar thick and regular solids. Pt exhibited delayed throat clear x1. Pt required one verbal cue to activate small sips strategy; suspect pt did not use strategy during breakfast tray. D/t weak cough, SLP initiated RMST (respiratory muscle strength training) regimen. Pt stimulable for device with therapist modifying resistance based on pt's accuracy of exhalations and reported exertion level. Device set at 17 cm H20 resulted in excessive resistance; 12 cm H20 was an adequate setting. Total she completed ~30 repetitions  SLP prescribed pt to complete 10 repetitions with device 3x/day set at 12 cm H2O. SLP educated pt and grandson re: risk of aspiration/pneumonia, thickened liquids, use of RMST, and possibility of future diet upgrade. SLP will continue to follow for management of diet toleration, upgraded PO trials, and implementation of compensatory strategies. Recommend continuing Dys3/Nectar thick liquid diet, administering medications whole in puree.    HPI HPI: 87 y.o. female with medical history significant for type 2 diabetes mellitus, stage IIIb chronic kidney disease with baseline creatinine 1.6-1.8, hypertension, chronic diastolic heart failure, who is admitted to [facility] on 04/10/2021 with bilateral lower extremity weakness after presenting to [facility] ED via ED to ED transfer from [place] ED, have presenting to the latter facility complaining of bilateral lower extremity weakness. Imaging revealed no abnormalities in the brain but did reveal degenerative changes to the cervical and thoracic  spine. Pt reports history of dysphagia.      SLP Plan  Continue with current plan of care      Recommendations for follow up therapy are one component of a multi-disciplinary discharge planning process, led by the attending physician.  Recommendations may be updated based on patient status, additional functional criteria and insurance authorization.    Recommendations  Diet recommendations: Dysphagia 3 (mechanical soft);Nectar-thick liquid Liquids provided via: Straw Medication Administration: Whole meds with liquid Supervision: Intermittent supervision to cue for compensatory strategies Compensations: Slow rate;Small sips/bites Postural Changes and/or Swallow Maneuvers: Seated upright 90 degrees;Upright 30-60 min after meal                Oral Care Recommendations: Oral care BID Follow up Recommendations: Inpatient Rehab SLP Visit Diagnosis: Dysphagia, pharyngeal phase (R13.13) Plan: Continue with current plan of care       GO               Dewitt Rota, SLP-Student  Dewitt Rota  04/13/2021, 10:47 AM

## 2021-04-13 NOTE — Evaluation (Signed)
Occupational Therapy Evaluation Patient Details Name: Catherine Mason MRN: [id number] DOB: [birth year] Today's Date: 04/13/2021   History of Present Illness Pt is a 87 y/o female who presents with bilateral lower extremity weakness after presenting to [facility] ED via ED-to-ED transfer from [facility]. Imaging revealed no abnormalities in the brain but did reveal degenerative changes to the cervical and thoracic spine. PMH significant for type 2 diabetes mellitus, stage IIIb chronic kidney disease with baseline creatinine 1.6-1.8, HTN, chronic diastolic heart failure, dysphagia.   Clinical Impression   Catherine Mason was mod I prior to the above admission. Evaluation completed in conjunction with PT for safety and to address activity tolerance, ADLs and mobility. Pt lives alone in a 1 level home with a ramped entrance, she does not drive and family brings her meals. Pt was max A-total +2 for all mobility and ADLs this session including BSC transfer for BM, and hygiene. Pt is limited by global weakness, poor activity tolerance, BLE pain, and  poor shoulder ROM. Steady used for [employer] transfer. Pt continues to benefit from OT acutely. Recommend d/c to CIR to progress towards baseline prior to going home.      Recommendations for follow up therapy are one component of a multi-disciplinary discharge planning process, led by the attending physician.  Recommendations may be updated based on patient status, additional functional criteria and insurance authorization.   Follow Up Recommendations  CIR    Equipment Recommendations  Other (comment) (defer to next venue)       Precautions / Restrictions Precautions Precautions: Fall Precaution Comments: Painful and swollen R ankle Restrictions Weight Bearing Restrictions: No      Mobility Bed Mobility Overal bed mobility: Needs Assistance Bed Mobility: Rolling;Sidelying to Sit Rolling: Max assist Sidelying to sit: Mod assist;HOB elevated       General bed  mobility comments: VC's to reach for railing, and bed pad utilized for complete roll to the L. Assist for trunk elevation to full sitting position.    Transfers Overall transfer level: Needs assistance Equipment used: 2 person hand held assist Transfers: Sit to/from [employer] Sit to Stand: Max assist;From elevated surface;+2 physical assistance;+2 safety/equipment   Squat pivot transfers: Max assist;From elevated surface;+2 physical assistance;+2 safety/equipment     General transfer comment: +2 assist required for transition from bed to [facility]. Stedy utilized for standing to perform peri-care (total assist required), and transition to recliner. Pt reporting difficulty accepting weight onto the RLE due to pain in her ankle.    Balance Overall balance assessment: Needs assistance Sitting-balance support: Feet supported;Bilateral upper extremity supported Sitting balance-Leahy Scale: Poor Sitting balance - Comments: Reliant on at least 1 UE support. Postural control: Posterior lean;Left lateral lean Standing balance support: Bilateral upper extremity supported;During functional activity Standing balance-Leahy Scale: Zero Standing balance comment: +2 assist required.                           ADL either performed or assessed with clinical judgement   ADL Overall ADL's : Needs assistance/impaired Eating/Feeding: Set up;Sitting   Grooming: Set up;Sitting   Upper Body Bathing: Moderate assistance;Sitting   Lower Body Bathing: Maximal assistance;+2 for physical assistance;+2 for safety/equipment;Sit to/from stand   Upper Body Dressing : Minimal assistance;Sitting   Lower Body Dressing: Maximal assistance;+2 for physical assistance;+2 for safety/equipment;Sit to/from stand   Toilet Transfer: Maximal assistance;+2 for physical assistance;+2 for safety/equipment;BSC;Stand-pivot   Toileting- Clothing Manipulation and Hygiene: +2 for physical assistance;+2 for  safety/equipment;Total assistance;Sit to/from stand Toileting - Clothing Manipulation Details (indicate cue type and reason): hygiene after BM in standing total A     Functional mobility during ADLs: Maximal assistance;+2 for physical assistance;+2 for safety/equipment General ADL Comments: max A +2 for all mobility including BSC transfer for BM. total A for hygiene in standing. use of steady frame. total A transfer with steady from [facility] to recliner     Vision Baseline Vision/History: 0 No visual deficits Ability to See in Adequate Light: 0 Adequate Vision Assessment?: No apparent visual deficits     Perception     Praxis      Pertinent Vitals/Pain Pain Assessment: Faces Faces Pain Scale: Hurts little more Pain Location: R ankle Pain Descriptors / Indicators: Sore;Sharp Pain Intervention(s): Limited activity within patient's tolerance     Hand Dominance     Extremity/Trunk Assessment Upper Extremity Assessment Upper Extremity Assessment: RUE deficits/detail;LUE deficits/detail RUE Deficits / Details: limited overhead shoulder ROM, generally weak RUE Coordination: decreased fine motor LUE Deficits / Details: limited overhead ROM, generally weak LUE Sensation: WNL LUE Coordination: decreased fine motor   Lower Extremity Assessment Lower Extremity Assessment: Defer to PT evaluation   Cervical / Trunk Assessment Cervical / Trunk Assessment: Kyphotic Cervical / Trunk Exceptions: With forward head posture with rounded shoulders   Communication Communication Communication: HOH   Cognition Arousal/Alertness: Awake/alert Behavior During Therapy: Flat affect Overall Cognitive Status: Within Functional Limits for tasks assessed                                     General Comments  VSS on RA, pt's granddaughter present and supportive    Exercises     Shoulder Instructions      Home Living Family/patient expects to be discharged to:: Private  residence Living Arrangements: Alone Available Help at Discharge: Family;Available 24 hours/day Type of Home: House Home Access: Ramped entrance     Home Layout: One level     Bathroom Shower/Tub: Occupational psychologist: Standard     Home Equipment: Shower seat - built in;Walker - 4 wheels;Cane - single point;Grab bars - tub/shower          Prior Functioning/Environment Level of Independence: Independent with assistive device(s)        Comments: Uses the rollator. Family brings in meals - she does microwavce cooking, and helps with groceries. Does not drive. Independent with ADL's.        OT Problem List: Decreased strength;Decreased activity tolerance;Decreased range of motion;Impaired balance (sitting and/or standing);Decreased safety awareness;Decreased knowledge of use of DME or AE;Decreased knowledge of precautions;Pain      OT Treatment/Interventions: Therapeutic exercise;Self-care/ADL training;DME and/or AE instruction;Therapeutic activities;Balance training;Patient/family education    OT Goals(Current goals can be found in the care plan section) Acute Rehab OT Goals Patient Stated Goal: Be able to walk again OT Goal Formulation: With patient Time For Goal Achievement: 04/27/21 Potential to Achieve Goals: Fair  OT Frequency: Min 2X/week   Barriers to D/C: Decreased caregiver support  pt lives alone       Co-evaluation              AM-PAC OT "6 Clicks" Daily Activity     Outcome Measure Help from another person eating meals?: A Little Help from another person taking care of personal grooming?: A Little Help from another person toileting, which includes using toliet, bedpan, or urinal?: A Lot  Help from another person bathing (including washing, rinsing, drying)?: A Lot Help from another person to put on and taking off regular upper body clothing?: A Little Help from another person to put on and taking off regular lower body clothing?: A  Lot 6 Click Score: 15   End of Session Equipment Utilized During Treatment: Gait belt (steady) Nurse Communication: Mobility status  Activity Tolerance: Patient tolerated treatment well Patient left: in chair;with call bell/phone within reach;with chair alarm set  OT Visit Diagnosis: Unsteadiness on feet (R26.81);Other abnormalities of gait and mobility (R26.89);Repeated falls (R29.6);Muscle weakness (generalized) (M62.81);Pain                Time: 0964-3838 OT Time Calculation (min): 38 min Charges:  OT General Charges $OT Visit: 1 Visit OT Evaluation $OT Eval Moderate Complexity: 1 Mod OT Treatments $Self Care/Home Management : 8-22 mins   Jaine Estabrooks A Zandria Woldt 04/13/2021, 4:27 PM

## 2021-04-14 DIAGNOSIS — E1140 Type 2 diabetes mellitus with diabetic neuropathy, unspecified: Secondary | ICD-10-CM | POA: Diagnosis not present

## 2021-04-14 DIAGNOSIS — R29898 Other symptoms and signs involving the musculoskeletal system: Secondary | ICD-10-CM | POA: Diagnosis not present

## 2021-04-14 DIAGNOSIS — I5032 Chronic diastolic (congestive) heart failure: Secondary | ICD-10-CM | POA: Diagnosis not present

## 2021-04-14 DIAGNOSIS — I10 Essential (primary) hypertension: Secondary | ICD-10-CM | POA: Diagnosis not present

## 2021-04-14 DIAGNOSIS — E119 Type 2 diabetes mellitus without complications: Secondary | ICD-10-CM | POA: Diagnosis not present

## 2021-04-14 LAB — GLUCOSE, CAPILLARY
Glucose-Capillary: 172 mg/dL — ABNORMAL HIGH (ref 70–99)
Glucose-Capillary: 191 mg/dL — ABNORMAL HIGH (ref 70–99)
Glucose-Capillary: 253 mg/dL — ABNORMAL HIGH (ref 70–99)
Glucose-Capillary: 353 mg/dL — ABNORMAL HIGH (ref 70–99)

## 2021-04-14 LAB — TSH: TSH: 25.709 u[IU]/mL — ABNORMAL HIGH (ref 0.350–4.500)

## 2021-04-14 LAB — VITAMIN B12: Vitamin B-12: 400 pg/mL (ref 180–914)

## 2021-04-14 LAB — T4, FREE: Free T4: 1.06 ng/dL (ref 0.61–1.12)

## 2021-04-14 MED ORDER — LEVOTHYROXINE SODIUM 100 MCG PO TABS
100.0000 ug | ORAL_TABLET | Freq: Every day | ORAL | Status: DC
  Administered 2021-04-15 – 2021-04-20 (×6): 100 ug via ORAL
  Filled 2021-04-14 (×6): qty 1

## 2021-04-14 MED ORDER — CAPSAICIN 0.025 % EX CREA
TOPICAL_CREAM | Freq: Two times a day (BID) | CUTANEOUS | Status: DC
  Filled 2021-04-14: qty 60

## 2021-04-14 MED ORDER — GABAPENTIN 100 MG PO CAPS
200.0000 mg | ORAL_CAPSULE | Freq: Every day | ORAL | Status: DC
  Administered 2021-04-14 – 2021-04-15 (×2): 200 mg via ORAL
  Filled 2021-04-14 (×2): qty 2

## 2021-04-14 MED ORDER — CAPSAICIN 0.075 % EX CREA
TOPICAL_CREAM | Freq: Two times a day (BID) | CUTANEOUS | Status: DC
  Filled 2021-04-14: qty 60

## 2021-04-14 NOTE — Progress Notes (Addendum)
Physical Therapy Treatment Patient Details Name: Catherine Mason MRN: [id number] DOB: 16-Dec-1927 Today's Date: 04/14/2021   History of Present Illness Pt is a 87 y/o female who presents with bilateral lower extremity weakness after presenting to [facility] ED via ED-to-ED transfer from [facility]. Imaging revealed no abnormalities in the brain but did reveal degenerative changes to the cervical and thoracic spine. PMH significant for type 2 diabetes mellitus, stage IIIb chronic kidney disease with baseline creatinine 1.6-1.8, HTN, chronic diastolic heart failure, dysphagia.    PT Comments    Pt progressing slowly towards physical therapy goals. Continue to recommend CIR level therapies as pt was independent PTA, and was able to tolerate a 51 minute session this date between mobility and ADL's. Encouraged pt and family to discuss plan for eventual return home. Will continue to follow.     Recommendations for follow up therapy are one component of a multi-disciplinary discharge planning process, led by the attending physician.  Recommendations may be updated based on patient status, additional functional criteria and insurance authorization.  Follow Up Recommendations  CIR     Equipment Recommendations  Other (comment) (TBD by next venue of care)    Recommendations for Other Services Rehab consult     Precautions / Restrictions Precautions Precautions: Fall Precaution Comments: Painful B feet - R worse than L Restrictions Weight Bearing Restrictions: No     Mobility  Bed Mobility Overal bed mobility: Needs Assistance Bed Mobility: Rolling;Sidelying to Sit Rolling: Mod assist Sidelying to sit: Mod assist;HOB elevated       General bed mobility comments: VC's to reach for railing, and bed pad utilized for complete roll to the L. Assist for trunk elevation to full sitting position.    Transfers Overall transfer level: Needs assistance   Transfers: Sit to/from Stand Sit to Stand: Max  assist;From elevated surface;+2 physical assistance;+2 safety/equipment         General transfer comment: +2 assist for power-up to full stand and to achieve full hip extension for placement of Stedy flaps under hips. Stood x2 during session.  Ambulation/Gait             General Gait Details: Unable to assess this session   Stairs             Wheelchair Mobility    Modified Rankin (Stroke Patients Only)       Balance Overall balance assessment: Needs assistance Sitting-balance support: Feet supported;Bilateral upper extremity supported Sitting balance-Leahy Scale: Poor Sitting balance - Comments: Reliant on at least 1 UE support. Postural control: Posterior lean;Left lateral lean Standing balance support: Bilateral upper extremity supported;During functional activity Standing balance-Leahy Scale: Zero Standing balance comment: +2 assist required.                            Cognition Arousal/Alertness: Awake/alert Behavior During Therapy: Flat affect Overall Cognitive Status: Within Functional Limits for tasks assessed                                        Exercises      General Comments        Pertinent Vitals/Pain Pain Assessment: Faces Faces Pain Scale: Hurts even more Pain Location: R ankle Pain Descriptors / Indicators: Sore;Sharp Pain Intervention(s): Limited activity within patient's tolerance;Monitored during session;Repositioned    Home Living  Prior Function            PT Goals (current goals can now be found in the care plan section) Acute Rehab PT Goals Patient Stated Goal: Be able to walk again PT Goal Formulation: With patient/family Time For Goal Achievement: 04/19/21 Potential to Achieve Goals: Good Progress towards PT goals: Progressing toward goals    Frequency    Min 3X/week      PT Plan Current plan remains appropriate    Co-evaluation               AM-PAC PT "6 Clicks" Mobility   Outcome Measure  Help needed turning from your back to your side while in a flat bed without using bedrails?: A Lot Help needed moving from lying on your back to sitting on the side of a flat bed without using bedrails?: A Lot Help needed moving to and from a bed to a chair (including a wheelchair)?: Total Help needed standing up from a chair using your arms (e.g., wheelchair or bedside chair)?: Total Help needed to walk in hospital room?: Total Help needed climbing 3-5 steps with a railing? : Total 6 Click Score: 8    End of Session Equipment Utilized During Treatment: Gait belt Activity Tolerance: Patient limited by fatigue (weakness) Patient left: in chair;with call bell/phone within reach;with chair alarm set;with family/visitor present Nurse Communication: Mobility status;Need for lift equipment PT Visit Diagnosis: Unsteadiness on feet (R26.81);Muscle weakness (generalized) (M62.81);Difficulty in walking, not elsewhere classified (R26.2)     Time: 0932-6712 PT Time Calculation (min) (ACUTE ONLY): 51 min  Charges:  $Therapeutic Activity: 8-22 mins                     Catherine Mason, PT, DPT Acute Rehabilitation Services Pager: [phone] Office: [phone]    Catherine Mason 04/14/2021, 4:11 PM

## 2021-04-14 NOTE — Progress Notes (Signed)
Speech Language Pathology Treatment: Dysphagia  Patient Details Name: Catherine Mason MRN: [id number] DOB: April 20, 1928 Today's Date: 04/14/2021 Time: 5697-9480 SLP Time Calculation (min) (ACUTE ONLY): 17 min  Assessment / Plan / Recommendation Clinical Impression  Pt was seen for therapy targeting swallowing goals. Pt finished breakfast tray prior to arrival with aid of NT who reported using small sips/bites technique and no coughing/throat clearing with meal. SLP and pt completed RMST (respiratory muscle strength training) regimen. Pt stimulable for device after 2 verbal reminders regarding air flow and lip placement. Therapist modified resistance level from 10 cm H2O up to 16 cm H2O with pt completing 5 repetitions at each level. Total, she completed 30 repetitions. SLP kept device at 16 cm H2O for further pt use (2-3x/day, 10 repetitions per use) d/t pt report that it required moderate effort. Recommend continuing with Dys3/nectar thick liquid diet administering medications whole with liquid d/t positive reports from pt and NT regarding mealtime. SLP will continue to follow acutely.   HPI HPI: 87 y.o. female with medical history significant for type 2 diabetes mellitus, stage IIIb chronic kidney disease with baseline creatinine 1.6-1.8, hypertension, chronic diastolic heart failure, who is admitted to [facility] on 04/10/2021 with bilateral lower extremity weakness after presenting to [facility] ED via ED to ED transfer from [place] ED, have presenting to the latter facility complaining of bilateral lower extremity weakness. Imaging revealed no abnormalities in the brain but did reveal degenerative changes to the cervical and thoracic spine. Pt reports history of dysphagia.      SLP Plan  Continue with current plan of care      Recommendations for follow up therapy are one component of a multi-disciplinary discharge planning process, led by the attending physician.  Recommendations may be updated  based on patient status, additional functional criteria and insurance authorization.    Recommendations  Diet recommendations: Dysphagia 3 (mechanical soft);Nectar-thick liquid Liquids provided via: Straw Medication Administration: Whole meds with liquid Supervision: Intermittent supervision to cue for compensatory strategies Compensations: Slow rate;Small sips/bites Postural Changes and/or Swallow Maneuvers: Seated upright 90 degrees;Upright 30-60 min after meal                General recommendations: Rehab consult Oral Care Recommendations: Oral care BID Follow up Recommendations: Inpatient Rehab SLP Visit Diagnosis: Dysphagia, pharyngeal phase (R13.13) Plan: Continue with current plan of care       GO              Dewitt Rota, SLP-Student   Dewitt Rota  04/14/2021, 12:08 PM

## 2021-04-14 NOTE — Progress Notes (Signed)
Occupational Therapy Treatment Patient Details Name: Catherine Mason MRN: [id number] DOB: 08-Oct-1927 Today's Date: 04/14/2021   History of present illness Pt is a 87 y/o female who presents with bilateral lower extremity weakness after presenting to [facility] ED via ED-to-ED transfer from [facility]. Imaging revealed no abnormalities in the brain but did reveal degenerative changes to the cervical and thoracic spine. PMH significant for type 2 diabetes mellitus, stage IIIb chronic kidney disease with baseline creatinine 1.6-1.8, HTN, chronic diastolic heart failure, dysphagia.   OT comments  Catherine Mason is progressing incrementally towards her goals. She continues to be limited by BLE pain that is sensitive to the touch, and during PROM and AROM. Pt was slow and deliberate for all bed level grooming tasks and required set up assist and significantly increased time to complete. She continues to require max A +2 for bed mobility and sit<>stand. She fluctuated between min guard and min A for sitting EOB balance. Pt continues to benefit from OT acutely. D/c recommendation remains appropriate.    Recommendations for follow up therapy are one component of a multi-disciplinary discharge planning process, led by the attending physician.  Recommendations may be updated based on patient status, additional functional criteria and insurance authorization.    Follow Up Recommendations  CIR    Equipment Recommendations  Other (comment)       Precautions / Restrictions Precautions Precautions: Fall Precaution Comments: Painful B feet - R worse than L Restrictions Weight Bearing Restrictions: No       Mobility Bed Mobility Overal bed mobility: Needs Assistance Bed Mobility: Rolling;Sidelying to Sit Rolling: Mod assist Sidelying to sit: Mod assist;HOB elevated       General bed mobility comments: VC's to reach for railing, and bed pad utilized for complete roll to the L. Assist for trunk elevation to full sitting  position.    Transfers Overall transfer level: Needs assistance   Transfers: Sit to/from Stand Sit to Stand: Max assist;From elevated surface;+2 physical assistance;+2 safety/equipment         General transfer comment: +2 assist for power-up to full stand and to achieve full hip extension for placement of Stedy flaps under hips. Stood x2 during session.    Balance Overall balance assessment: Needs assistance Sitting-balance support: Feet supported;Bilateral upper extremity supported Sitting balance-Leahy Scale: Poor Sitting balance - Comments: Reliant on at least 1 UE support. Postural control: Posterior lean;Left lateral lean Standing balance support: Bilateral upper extremity supported;During functional activity Standing balance-Leahy Scale: Zero Standing balance comment: +2 assist required.                           ADL either performed or assessed with clinical judgement   ADL Overall ADL's : Needs assistance/impaired     Grooming: Set up;Sitting Grooming Details (indicate cue type and reason): pt able to open the toothpaste cap, unable to coordinate squeezing toothpaste onto brush - required set up. Extremely slow and deliberate with tooth brushing task                 Toilet Transfer: Maximal assistance;Total assistance;+2 for physical assistance;+2 for safety/equipment;BSC Toilet Transfer Details (indicate cue type and reason): simulated - light max A +2 for standing, and total A for transfer         Functional mobility during ADLs: Maximal assistance;+2 for physical assistance;+2 for safety/equipment General ADL Comments: pt continues to be limited by generalized weakness and BLE pain     Vision  Perception     Praxis      Cognition Arousal/Alertness: Awake/alert Behavior During Therapy: Flat affect Overall Cognitive Status: Within Functional Limits for tasks assessed                                           Exercises     Shoulder Instructions       General Comments VSS on RA - pt seemingly aspirated after rinsing with water after tooth burshing task. Productively coughed excess water out, pt talking without wet sounds at the end of the session, left sitting in an up right position. RN aware.    Pertinent Vitals/ Pain       Pain Assessment: Faces Faces Pain Scale: Hurts even more Pain Location: R ankle Pain Descriptors / Indicators: Sore;Sharp Pain Intervention(s): Limited activity within patient's tolerance  Home Living                                          Prior Functioning/Environment              Frequency  Min 2X/week        Progress Toward Goals  OT Goals(current goals can now be found in the care plan section)  Progress towards OT goals: Progressing toward goals  Acute Rehab OT Goals Patient Stated Goal: Be able to walk again OT Goal Formulation: With patient Time For Goal Achievement: 04/27/21 Potential to Achieve Goals: Fair ADL Goals Pt Will Perform Grooming: with min guard assist;standing Pt Will Perform Lower Body Bathing: with min assist;sit to/from stand Pt Will Perform Lower Body Dressing: with min assist;sit to/from stand Pt Will Transfer to Toilet: with min assist;ambulating;bedside commode Pt Will Perform Toileting - Clothing Manipulation and hygiene: with min guard assist;sitting/lateral leans Additional ADL Goal #1: Pt will tolerate OOB functional task for 5 minutes with min A  Plan Discharge plan remains appropriate    Co-evaluation                 AM-PAC OT "6 Clicks" Daily Activity     Outcome Measure   Help from another person eating meals?: A Little Help from another person taking care of personal grooming?: A Little Help from another person toileting, which includes using toliet, bedpan, or urinal?: A Lot Help from another person bathing (including washing, rinsing, drying)?: A Lot Help from another person  to put on and taking off regular upper body clothing?: A Little Help from another person to put on and taking off regular lower body clothing?: A Lot 6 Click Score: 15    End of Session Equipment Utilized During Treatment: Gait belt (steadt)  OT Visit Diagnosis: Unsteadiness on feet (R26.81);Other abnormalities of gait and mobility (R26.89);Repeated falls (R29.6);Muscle weakness (generalized) (M62.81);Pain   Activity Tolerance Patient tolerated treatment well   Patient Left in chair;with call bell/phone within reach;with chair alarm set   Nurse Communication Mobility status        Time: 1409-1500 OT Time Calculation (min): 51 min  Charges: OT General Charges $OT Visit: 1 Visit OT Treatments $Self Care/Home Management : 23-37 mins   Arwilda Georgia A Markiah Janeway 04/14/2021, 5:16 PM

## 2021-04-14 NOTE — Progress Notes (Signed)
Inpatient Rehabilitation Admissions Coordinator   I contacted pt's grandson, Lawerance Cruel per her request. I discussed goals and expectations of a possible Cir admit, if she has caregiver supports which would be needed after a CIR admit. She was at [place]. He will discuss with family and let me know if he would like to pursue CIR vs SNF.  Danne Baxter, RN, MSN Rehab Admissions Coordinator [phone] 04/14/2021 4:16 PM

## 2021-04-14 NOTE — Progress Notes (Signed)
Neurology Progress Note  Subjective: No acute overnight events  Patient family at bedside reporting that Catherine Mason has had a progressive decline in ambulation since June of this year. They also note that she has been slowly tapering off of her insulin as she only checked it once per day in the morning and noted that it was lower and has not checked it throughout the day. Patient complains of her feet burning and feels like she is "walking on cotton or rubber" for the past 3-4 months. She states that her mobility is largely limited due to her pain in her feet and toes and complains that she now feels burning in her fingertips.   Exam: Vitals:   04/14/21 0330 04/14/21 0827  BP: (!) 115/57 (!) 123/53  Pulse: 80 77  Resp: 17 16  Temp: 98.4 F (36.9 C) 98.4 F (36.9 C)  SpO2: 98% 98%   Gen: Sitting up comfortably in bed with family at bedside, in no acute distress Resp: non-labored breathing, no respiratory distress on room air Abd: soft, non-tender, non-distended  Neuro: Mental Status: Awake, alert, and oriented throughout She is able to provide a clear and coherent history of present illness with family providing some more specific details regarding her timeline.  Her speech is intact without dysarthria or aphasia.  There is no neglect present.  Cranial Nerves: PERRL, EOMI without ptosis, visual fields full, face is symmetric resting and smiling, hearing is intact to voice, shoulder shrug is symmetric, phonation is normal, tongue is midline.  Motor: 4/5 strength bilateral upper extremities without vertical drift,  Bilateral lower extremity weakness, she attempts to raise extremities against gravity without success 2/5 at the hip, 3/5 knees. Ankle strength significantly limited due to pain with touch and movement.  Sensory: Sensation decreased to light touch and temperature at the level of the feet to just below the knees bilaterally. Sensation to light touch is intact and symmetric to  bilateral upper extremities.  DTR: 3+ patellae, biceps, and pectoralis. Left ankle 1+ with right ankle diminished.  Plantars: Toes upgoing bilaterally Gait: Deferred for patient safety  Pertinent Labs: CBC    Component Value Date/Time   WBC 6.4 04/11/2021 0507   RBC 4.04 04/11/2021 0507   HGB 12.3 04/11/2021 0507   HCT 38.3 04/11/2021 0507   PLT 160 04/11/2021 0507   MCV 94.8 04/11/2021 0507   MCH 30.4 04/11/2021 0507   MCHC 32.1 04/11/2021 0507   RDW 13.4 04/11/2021 0507   LYMPHSABS 2.4 04/10/2021 1558   MONOABS 0.5 04/10/2021 1558   EOSABS 0.3 04/10/2021 1558   BASOSABS 0.1 04/10/2021 1558   CMP     Component Value Date/Time   NA 135 04/13/2021 0305   K 4.4 04/13/2021 0305   CL 110 04/13/2021 0305   CO2 18 (L) 04/13/2021 0305   GLUCOSE 175 (H) 04/13/2021 0305   BUN 30 (H) 04/13/2021 0305   CREATININE 1.79 (H) 04/13/2021 0305   CREATININE 1.43 (H) 02/03/2015 1410   CALCIUM 8.3 (L) 04/13/2021 0305   CALCIUM 8.6 07/30/2010 1419   PROT 6.2 (L) 04/11/2021 0507   ALBUMIN 2.9 (L) 04/11/2021 0507   AST 18 04/11/2021 0507   ALT 17 04/11/2021 0507   ALKPHOS 128 (H) 04/11/2021 0507   BILITOT 0.8 04/11/2021 0507   GFRNONAA 26 (L) 04/13/2021 0305   GFRAA 30 (L) 01/15/2013 0530   Lab Results  Component Value Date   ESRSEDRATE 39 (H) 04/12/2021   Lab Results  Component Value Date  CKTOTAL 36 (L) 04/11/2021   CKMB 4.0 11/08/2009   TROPONINI 16.87 (HH) 01/13/2013   Lab Results  Component Value Date   CRP 1.0 (H) 04/11/2021   Lab Results  Component Value Date   TSH 25.709 (H) 04/14/2021   Lab Results  Component Value Date   VITAMINB12 400 04/14/2021    Ref. Range 04/13/2021 10:15 04/13/2021 10:15  Appearance, CSF Latest Ref Range: CLEAR  HAZY (A) TURBID (A)  Glucose, CSF Latest Ref Range: 40 - 70 mg/dL 117 (H)   RBC Count, CSF Latest Ref Range: 0 /cu mm 2,780 (H) SPECIMEN CLOTTED  WBC, CSF Latest Ref Range: 0 - 5 /cu mm 4 SPECIMEN CLOTTED  Segmented  Neutrophils-CSF Latest Ref Range: 0 - 6 %  70 (H)  Lymphs, CSF Latest Ref Range: 40 - 80 %  28 (L)  Monocyte-Macrophage-Spinal Fluid Latest Ref Range: 15 - 45 %  0 (L)  Eosinophils, CSF Latest Ref Range: 0 - 1 %  2 (H)  Other Cells, CSF Unknown TOO FEW TO COUNT, SMEAR AVAILABLE FOR REVIEW   Color, CSF Latest Ref Range: COLORLESS  PINK (A) RED (A)  Supernatant Unknown COLORLESS COLORLESS  Total  Protein, CSF Latest Ref Range: 15 - 45 mg/dL 44   Tube # Unknown 1 4   Imaging Reviewed: MRI cervical spine 10/09: - Mildly motion degraded exam. - Within the limitations of motion degradation, no spinal cord signal abnormality is identified. - Cervical spondylosis, as outlined. Multilevel spinal canal stenosis. - Most notably, there is multifactorial mild/moderate spinal canal stenosis at C5-C6. Multilevel foraminal stenosis, as detailed and greatest on the left at C3-C4 (moderate), on the right at C4-C5 (moderate/severe), and on the right at C5-C6 (moderate/severe). - Disc degeneration is greatest (severe) at C4-C5, C5-C6, C6-C7 and T1-T2.  MRI thoracic spine 10/09: 1.  No acute osseous injury of the thoracic spine. 2. No myelopathy of the thoracic spinal cord. 3. No evidence of nerve root impingement of the thoracic spine.  MRI lumbar spine 10/09: 1. No acute abnormality of the lumbar spine. 2. Dextroscoliosis with apex at L3. 3. Multilevel moderate to severe facet arthrosis, worst at right L4-5 and L5-S1. 4. Mild neural foraminal stenosis at left L1-L2, left L2-L3, right L4-L5 and right L5-S1.  MRI brain wwo contrast 10/09: 1. No acute intracranial abnormality. 2. Findings of chronic microvascular disease and mild volume loss.  Assessment: 87 year old female with BLE weakness and progressively worsening gait x 4-6 weeks, with more rapid worsening over the past week.  - Examination reveals patient with pain in bilateral lower extremities described as burning, bilateral lower extremity  weakness- thought to be 2/2 pain, and decreased temperature and light touch sensation to bilateral lower extremities distal to the knee. - Presentation is felt to be most consistent with length dependent neuropathy with weakness secondary to pain with elevated A1c, progressive decline, and sensory changes to BLE and finger tips, though a paraneoplastic syndrome cannot be ruled out at this time. Family wishes to progress with PT/OT and gauge patient's strength response to controlling her neuropathic pain prior to pursuing further invasive diagnostic testing at this time.  - Patient does have some severe disc generation at C4-7 and T1-T2 contributing to patient's neck pain.   Recommendations: - TSH elevated at 25.709, t4 added on  - Capsicum cream BID  - Agree with gabapentin - Recommend close follow up with neurology for EMG/NCS studies - Continue PT/OT as you are - Management of diabetes and  other comorbid conditions per primary team - Will reassess patient's strength once her pain is more adequately controlled  Catherine Mason, AGACNP-BC [facility] [phone]  Attending attestation: Patient seen, examined, labs,vitals and notes reviewed. Discussed plan with Catherine Henderson, Pa and agree with assessment and plan as documented above. I have independently reviewed the chart, obtained history, review of systems and examined the patient.  Electronically signed by:  Lynnae Sandhoff, MD Page: [phone] 04/14/2021, 7:01 PM

## 2021-04-14 NOTE — Progress Notes (Signed)
[facility] PROGRESS NOTE    Progress Note  Catherine Mason  EKC:[id number] DOB: 1928-02-25 DOA: 04/10/2021 PCP: Reynold Bowen, MD     Brief Narrative:   Catherine Mason is an 87 y.o. female past medical history significant for diabetes mellitus type 2 chronic kidney disease stage IIIb creatinine 1.6-1.8, CAD, Uterine cancer, essential hypertension chronic diastolic heart failure was transferred to [facility] from [facility] ED . -She presented with acute onset bilateral leg weakness X 1 day, she was ambulatory without issues the day prior to her symptoms  -MRI brain and entire spine was negative for acute findings  Significant studies: MRI of the brain showed no stroke. MRI of the lumbar spine shows severe osteoarthritis.  With mild neuroforaminal stenosis at L1-L2 L2-L3 and L4-L5 MRI of the cervical C-spine showed multiple spinal level canal stenosis most notably and C5-C6 MRI of the thoracic spine showed no myelopathy or spinal cord dysfunction no evidence of impingement.  LP 10/11  Assessment/Plan:   Subacute lower extremity weakness, pain -? Diabetic neuropathy -MRI brain, cervical thoracic and lumbar spine were unrevealing, degenerative changes noted -CT abdomen pelvis without evidence of malignancy,  -LP was completed yesterday, this was turbid with increase RBCs,?  Bloody tap, protein is normal -CK was 36, electrolytes were unremarkable CK was 36, electrolytes were unremarkable -Await neurology input, PT OT, SLP following, ? CIR recommended -will add low dose gabapentin, check B12  Chronic kidney disease stage IIIb: Unknown baseline on admission 1.8. -Creatinine stable around baseline  Diabetes mellitus type II uncontrolled: -CBGs relatively stable, continue Levemir and sliding scale  Essential  HTN (hypertension) -Continue amlodipine and Toprol  Hypothyroidism -TSH is high, increase Synthroid dose  Chronic diastolic heart failure: -Clinically appears  euvolemic  Diabetes mellitus type 2 with hyperglycemia: A1C of 9 she was started on low-dose long-acting insulin continue sliding scale.   DVT prophylaxis: lovenox  Code status: Full Code Family Communication: Son and step daughter at bedside Status is: Inpatient  Remains inpatient appropriate because:ongoing workup  Dispo: The patient is from: Home              Anticipated d/c is to: SNF              Patient currently is not medically stable to d/c.   Difficult to place patient No     Procedures and diagnostic studies:  LP 10/11   Medical Consultants:   Neurology   Subjective:    [name] complains of pain and weakness in lower legs  Objective:    Vitals:   04/14/21 0016 04/14/21 0330 04/14/21 0827 04/14/21 1152  BP: 133/65 (!) 115/57 (!) 123/53 (!) 117/55  Pulse: 81 80 77 75  Resp: 15 17 16 20   Temp: 98.5 F (36.9 C) 98.4 F (36.9 C) 98.4 F (36.9 C) 97.9 F (36.6 C)  TempSrc: Oral Oral Oral Oral  SpO2: 97% 98% 98% 98%  Weight:      Height:       SpO2: 98 %  No intake or output data in the 24 hours ending 04/14/21 1529 Filed Weights   04/11/21 1700 04/12/21 0343 04/13/21 0415  Weight: 63.5 kg 66.3 kg 66.8 kg    Exam: Gen: Awake, Alert, Oriented X 3,  HEENT: no JVD Lungs: Good air movement bilaterally, CTAB CVS: S1S2/RRR Abd: soft, Non tender, non distended, BS present Extremities: No edema Skin: no new rashes on exposed skin  Neuro: mild lower leg weakness, decreased pin prick and paresthesias  Data Reviewed:    Labs: Basic Metabolic Panel: Recent Labs  Lab 04/10/21 1457 04/10/21 1513 04/11/21 0507 04/12/21 1040 04/13/21 0305  NA 138 142 139 137 135  K 5.0 4.8 4.9 4.9 4.4  CL 112* 113* 113* 108 110  CO2 18*  --  19* 20* 18*  GLUCOSE 159* 158* 206* 218* 175*  BUN 33* 31* 27* 27* 30*  CREATININE 1.80* 1.80* 1.57* 1.66* 1.79*  CALCIUM 8.7*  --  8.8* 8.9 8.3*  MG  --   --  2.1  --   --    GFR Estimated Creatinine  Clearance: 19.2 mL/min (A) (by C-G formula based on SCr of 1.79 mg/dL (H)). Liver Function Tests: Recent Labs  Lab 04/10/21 1457 04/11/21 0507  AST 25 18  ALT 23 17  ALKPHOS 156* 128*  BILITOT 0.9 0.8  PROT 7.4 6.2*  ALBUMIN 3.8 2.9*   No results for input(s): LIPASE, AMYLASE in the last 168 hours. No results for input(s): AMMONIA in the last 168 hours. Coagulation profile Recent Labs  Lab 04/10/21 1457  INR 1.0   COVID-19 Labs  Recent Labs    04/11/21 1651  CRP 1.0*    Lab Results  Component Value Date   SARSCOV2NAA NEGATIVE 04/10/2021    CBC: Recent Labs  Lab 04/10/21 1513 04/10/21 1558 04/11/21 0507  WBC  --  6.8 6.4  NEUTROABS  --  3.6  --   HGB 13.6 12.3 12.3  HCT 40.0 40.0 38.3  MCV  --  98.8 94.8  PLT  --  153 160   Cardiac Enzymes: Recent Labs  Lab 04/11/21 1651  CKTOTAL 36*   BNP (last 3 results) No results for input(s): PROBNP in the last 8760 hours. CBG: Recent Labs  Lab 04/13/21 1733 04/13/21 1930 04/13/21 2104 04/14/21 0637 04/14/21 1244  GLUCAP 225* 259* 297* 172* 191*   D-Dimer: No results for input(s): DDIMER in the last 72 hours. Hgb A1c: Recent Labs    04/11/21 1651  HGBA1C 9.0*   Lipid Profile: No results for input(s): CHOL, HDL, LDLCALC, TRIG, CHOLHDL, LDLDIRECT in the last 72 hours. Thyroid function studies: Recent Labs    04/14/21 0818  TSH 25.709*   Anemia work up: Recent Labs    04/14/21 0818  VITAMINB12 400   Sepsis Labs: Recent Labs  Lab 04/10/21 1558 04/11/21 0507  WBC 6.8 6.4   Microbiology Recent Results (from the past 240 hour(s))  Resp Panel by RT-PCR (Flu A&B, Covid) Nasopharyngeal Swab     Status: None   Collection Time: 04/10/21  2:54 PM   Specimen: Nasopharyngeal Swab; Nasopharyngeal(NP) swabs in vial transport medium  Result Value Ref Range Status   SARS Coronavirus 2 by RT PCR NEGATIVE NEGATIVE Final    Comment: (NOTE) SARS-CoV-2 target nucleic acids are NOT DETECTED.  The  SARS-CoV-2 RNA is generally detectable in upper respiratory specimens during the acute phase of infection. The lowest concentration of SARS-CoV-2 viral copies this assay can detect is 138 copies/mL. A negative result does not preclude SARS-Cov-2 infection and should not be used as the sole basis for treatment or other patient management decisions. A negative result may occur with  improper specimen collection/handling, submission of specimen other than nasopharyngeal swab, presence of viral mutation(s) within the areas targeted by this assay, and inadequate number of viral copies(<138 copies/mL). A negative result must be combined with clinical observations, patient history, and epidemiological information. The expected result is Negative.  Fact Sheet for Patients:  EntrepreneurPulse.com.au  Fact Sheet for Healthcare Providers:  IncredibleEmployment.be  This test is no t yet approved or cleared by the [place] FDA and  has been authorized for detection and/or diagnosis of SARS-CoV-2 by FDA under an Emergency Use Authorization (EUA). This EUA will remain  in effect (meaning this test can be used) for the duration of the COVID-19 declaration under Section 564(b)(1) of the Act, 21 U.S.C.section 360bbb-3(b)(1), unless the authorization is terminated  or revoked sooner.       Influenza A by PCR NEGATIVE NEGATIVE Final   Influenza B by PCR NEGATIVE NEGATIVE Final    Comment: (NOTE) The Xpert Xpress SARS-CoV-2/FLU/RSV plus assay is intended as an aid in the diagnosis of influenza from Nasopharyngeal swab specimens and should not be used as a sole basis for treatment. Nasal washings and aspirates are unacceptable for Xpert Xpress SARS-CoV-2/FLU/RSV testing.  Fact Sheet for Patients: EntrepreneurPulse.com.au  Fact Sheet for Healthcare Providers: IncredibleEmployment.be  This test is not yet approved or  cleared by the [place] FDA and has been authorized for detection and/or diagnosis of SARS-CoV-2 by FDA under an Emergency Use Authorization (EUA). This EUA will remain in effect (meaning this test can be used) for the duration of the COVID-19 declaration under Section 564(b)(1) of the Act, 21 U.S.C. section 360bbb-3(b)(1), unless the authorization is terminated or revoked.  Performed at [facility], [address], [place], [place] [address]   CSF culture w Stat Gram Stain     Status: None (Preliminary result)   Collection Time: 04/13/21 10:15 AM   Specimen: CSF; Cerebrospinal Fluid  Result Value Ref Range Status   Specimen Description CSF  Final   Special Requests NONE  Final   Gram Stain   Final    CYTOSPIN SMEAR WBC PRESENT,BOTH PMN AND MONONUCLEAR NO ORGANISMS SEEN    Culture   Final    NO GROWTH < 24 HOURS Performed at [facility], [place] [address], [place],  [address]    Report Status PENDING  Incomplete     Medications:    amLODipine  5 mg Oral Daily   insulin aspart  0-5 Units Subcutaneous QHS   insulin aspart  0-6 Units Subcutaneous TID WC   insulin aspart  2 Units Subcutaneous TID WC   insulin detemir  4 Units Subcutaneous BID   [START ON 04/15/2021] levothyroxine  100 mcg Oral QAC breakfast   metoprolol succinate  50 mg Oral BID   Continuous Infusions:     LOS: 3 days   Domenic Polite  [facility]  04/14/2021, 3:29 PM

## 2021-04-15 DIAGNOSIS — N1832 Chronic kidney disease, stage 3b: Secondary | ICD-10-CM | POA: Diagnosis not present

## 2021-04-15 DIAGNOSIS — I5032 Chronic diastolic (congestive) heart failure: Secondary | ICD-10-CM | POA: Diagnosis not present

## 2021-04-15 DIAGNOSIS — R29898 Other symptoms and signs involving the musculoskeletal system: Secondary | ICD-10-CM | POA: Diagnosis not present

## 2021-04-15 LAB — CBC
HCT: 35.1 % — ABNORMAL LOW (ref 36.0–46.0)
Hemoglobin: 11.2 g/dL — ABNORMAL LOW (ref 12.0–15.0)
MCH: 30.1 pg (ref 26.0–34.0)
MCHC: 31.9 g/dL (ref 30.0–36.0)
MCV: 94.4 fL (ref 80.0–100.0)
Platelets: 154 10*3/uL (ref 150–400)
RBC: 3.72 MIL/uL — ABNORMAL LOW (ref 3.87–5.11)
RDW: 13.2 % (ref 11.5–15.5)
WBC: 6.9 10*3/uL (ref 4.0–10.5)
nRBC: 0 % (ref 0.0–0.2)

## 2021-04-15 LAB — GLUCOSE, CAPILLARY
Glucose-Capillary: 169 mg/dL — ABNORMAL HIGH (ref 70–99)
Glucose-Capillary: 216 mg/dL — ABNORMAL HIGH (ref 70–99)
Glucose-Capillary: 236 mg/dL — ABNORMAL HIGH (ref 70–99)
Glucose-Capillary: 246 mg/dL — ABNORMAL HIGH (ref 70–99)

## 2021-04-15 LAB — PATHOLOGIST SMEAR REVIEW

## 2021-04-15 LAB — BASIC METABOLIC PANEL
Anion gap: 7 (ref 5–15)
BUN: 42 mg/dL — ABNORMAL HIGH (ref 8–23)
CO2: 19 mmol/L — ABNORMAL LOW (ref 22–32)
Calcium: 8.6 mg/dL — ABNORMAL LOW (ref 8.9–10.3)
Chloride: 109 mmol/L (ref 98–111)
Creatinine, Ser: 1.93 mg/dL — ABNORMAL HIGH (ref 0.44–1.00)
GFR, Estimated: 24 mL/min — ABNORMAL LOW (ref >60)
Glucose, Bld: 212 mg/dL — ABNORMAL HIGH (ref 70–99)
Potassium: 5.1 mmol/L (ref 3.5–5.1)
Sodium: 135 mmol/L (ref 135–145)

## 2021-04-15 LAB — VITAMIN B12: Vitamin B-12: 333 pg/mL (ref 180–914)

## 2021-04-15 MED ORDER — GABAPENTIN 100 MG PO CAPS: 100.0000 mg | ORAL_CAPSULE | Freq: Every day | ORAL | Status: DC

## 2021-04-15 MED ORDER — SODIUM CHLORIDE 0.9 % IV SOLN: INTRAVENOUS | Status: AC

## 2021-04-15 NOTE — Plan of Care (Signed)
  Problem: Education: Goal: Knowledge of General Education information will improve Description: Including pain rating scale, medication(s)/side effects and non-pharmacologic comfort measures Outcome: Progressing   Problem: Health Behavior/Discharge Planning: Goal: Ability to manage health-related needs will improve Outcome: Progressing   Problem: Clinical Measurements: Goal: Ability to maintain clinical measurements within normal limits will improve Outcome: Progressing Goal: Will remain free from infection Outcome: Progressing Goal: Diagnostic test results will improve Outcome: Progressing Goal: Respiratory complications will improve Outcome: Progressing Goal: Cardiovascular complication will be avoided Outcome: Progressing   Problem: Nutrition: Goal: Adequate nutrition will be maintained Outcome: Progressing   Problem: Coping: Goal: Level of anxiety will decrease Outcome: Progressing   Problem: Elimination: Goal: Will not experience complications related to bowel motility Outcome: Progressing Goal: Will not experience complications related to urinary retention Outcome: Progressing   Problem: Pain Managment: Goal: General experience of comfort will improve Outcome: Progressing   Problem: Safety: Goal: Ability to remain free from injury will improve Outcome: Progressing   Problem: Education: Goal: Knowledge of disease or condition will improve Outcome: Progressing Goal: Knowledge of secondary prevention will improve Outcome: Progressing Goal: Knowledge of patient specific risk factors addressed and post discharge goals established will improve Outcome: Progressing Goal: Individualized Educational Video(s) Outcome: Progressing   Problem: Nutrition: Goal: Risk of aspiration will decrease Outcome: Progressing

## 2021-04-15 NOTE — Progress Notes (Signed)
[facility] PROGRESS NOTE    Progress Note  Catherine Mason  MBT:[id number] DOB: 25-Aug-1927 DOA: 04/10/2021 PCP: Reynold Bowen, MD     Brief Narrative:   Catherine Mason is an 87 y.o. female past medical history significant for diabetes mellitus type 2 chronic kidney disease stage IIIb creatinine 1.6-1.8, CAD, Uterine cancer, essential hypertension chronic diastolic heart failure was transferred to [facility] from [facility] ED . -She presented with acute onset bilateral leg weakness X 1 day, she was ambulatory without issues the day prior to her symptoms  -MRI brain and entire spine was negative for acute findings  Significant studies: MRI of the brain showed no stroke. MRI of the lumbar spine shows severe osteoarthritis.  With mild neuroforaminal stenosis at L1-L2 L2-L3 and L4-L5 MRI of the cervical C-spine showed multiple spinal level canal stenosis most notably and C5-C6 MRI of the thoracic spine showed no myelopathy or spinal cord dysfunction no evidence of impingement.  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 -MRI brain, cervical thoracic and lumbar spine were unrevealing, degenerative changes noted -CT abdomen pelvis without evidence of malignancy,  -LP was completed 10/11, CSF with increase RBCs,?  Bloody tap, protein is normal -CK was 36, electrolytes were unremarkable  -Started on low-dose gabapentin yesterday, reluctant to increase dose significantly due to advanced age, frailty and risk of oversedation, also started on capsaicin cream -Follow-up B12 -PT OT following, CIR recommended for rehab  AKI on chronic kidney disease stage IIIb: -baseline on admission 1.8. -Creatinine trended up today, blood pressures are soft, add gentle IV fluids -Check BMP in a.m., patient denies urinary retention  Diabetes mellitus type II uncontrolled: -CBGs relatively stable, continue Levemir and sliding scale  Essential  HTN  (hypertension) -Continue amlodipine and Toprol  Hypothyroidism -TSH is high, increased Synthroid dose  Chronic diastolic heart failure: -Clinically appears euvolemic  Diabetes mellitus type 2 with hyperglycemia: A1C of 9 she was started on low-dose long-acting insulin continue sliding scale.   DVT prophylaxis: lovenox  Code status: Full Code Family Communication: Son and step daughter at bedside yesterday Status is: Inpatient  Remains inpatient appropriate because:ongoing workup  Dispo: The patient is from: Home              Anticipated d/c is to: SNF versus CIR              Patient currently is not medically stable to d/c.   Difficult to place patient No     Procedures and diagnostic studies:  LP 10/11   Medical Consultants:   Neurology   Subjective:    Catherine Mason feels a little better this morning, still has pain and weakness in her legs  Objective:    Vitals:   04/15/21 0314 04/15/21 0734 04/15/21 0929 04/15/21 1139  BP: (!) 98/42 (!) 116/56 (!) 110/50 (!) 109/48  Pulse: 70 72 73 71  Resp: 17 18  16   Temp: 98 F (36.7 C) 97.9 F (36.6 C)  98.6 F (37 C)  TempSrc: Oral Oral  Oral  SpO2: 97% 100%  100%  Weight:      Height:       SpO2: 100 %   Intake/Output Summary (Last 24 hours) at 04/15/2021 1408 Last data filed at 04/15/2021 1247 Gross per 24 hour  Intake --  Output 450 ml  Net -450 ml   Filed Weights   04/11/21 1700 04/12/21 0343 04/13/21 0415  Weight: 63.5 kg 66.3 kg  66.8 kg    Exam: Gen: Pleasant elderly female laying in bed, little drowsy this morning, but easily arousable, oriented x2 HEENT: No JVD CVS: S1-S2, regular rate rhythm Lungs: Clear anteriorly Abdomen: Soft, nontender, bowel sounds present Extremities: No edema  Skin: no new rashes on exposed skin  Neuro: mild lower leg weakness, decreased pin prick and paresthesias  Data Reviewed:    Labs: Basic Metabolic Panel: Recent Labs  Lab 04/10/21 1457  04/10/21 1513 04/11/21 0507 04/12/21 1040 04/13/21 0305 04/15/21 0353  NA 138 142 139 137 135 135  K 5.0 4.8 4.9 4.9 4.4 5.1  CL 112* 113* 113* 108 110 109  CO2 18*  --  19* 20* 18* 19*  GLUCOSE 159* 158* 206* 218* 175* 212*  BUN 33* 31* 27* 27* 30* 42*  CREATININE 1.80* 1.80* 1.57* 1.66* 1.79* 1.93*  CALCIUM 8.7*  --  8.8* 8.9 8.3* 8.6*  MG  --   --  2.1  --   --   --    GFR Estimated Creatinine Clearance: 17.8 mL/min (A) (by C-G formula based on SCr of 1.93 mg/dL (H)). Liver Function Tests: Recent Labs  Lab 04/10/21 1457 04/11/21 0507  AST 25 18  ALT 23 17  ALKPHOS 156* 128*  BILITOT 0.9 0.8  PROT 7.4 6.2*  ALBUMIN 3.8 2.9*   No results for input(s): LIPASE, AMYLASE in the last 168 hours. No results for input(s): AMMONIA in the last 168 hours. Coagulation profile Recent Labs  Lab 04/10/21 1457  INR 1.0   COVID-19 Labs  No results for input(s): DDIMER, FERRITIN, LDH, CRP in the last 72 hours.   Lab Results  Component Value Date   [place] NEGATIVE 04/10/2021    CBC: Recent Labs  Lab 04/10/21 1513 04/10/21 1558 04/11/21 0507 04/15/21 0353  WBC  --  6.8 6.4 6.9  NEUTROABS  --  3.6  --   --   HGB 13.6 12.3 12.3 11.2*  HCT 40.0 40.0 38.3 35.1*  MCV  --  98.8 94.8 94.4  PLT  --  153 160 154   Cardiac Enzymes: Recent Labs  Lab 04/11/21 1651  CKTOTAL 36*   BNP (last 3 results) No results for input(s): PROBNP in the last 8760 hours. CBG: Recent Labs  Lab 04/14/21 1244 04/14/21 1621 04/14/21 2145 04/15/21 0605 04/15/21 1134  GLUCAP 191* 253* 353* 169* 216*   D-Dimer: No results for input(s): DDIMER in the last 72 hours. Hgb A1c: No results for input(s): HGBA1C in the last 72 hours.  Lipid Profile: No results for input(s): CHOL, HDL, LDLCALC, TRIG, CHOLHDL, LDLDIRECT in the last 72 hours. Thyroid function studies: Recent Labs    04/14/21 0818  TSH 25.709*   Anemia work up: Recent Labs    04/14/21 0818 04/15/21 0353   VITAMINB12 400 333   Sepsis Labs: Recent Labs  Lab 04/10/21 1558 04/11/21 0507 04/15/21 0353  WBC 6.8 6.4 6.9   Microbiology Recent Results (from the past 240 hour(s))  Resp Panel by RT-PCR (Flu A&B, Covid) Nasopharyngeal Swab     Status: None   Collection Time: 04/10/21  2:54 PM   Specimen: Nasopharyngeal Swab; Nasopharyngeal(NP) swabs in vial transport medium  Result Value Ref Range Status   SARS Coronavirus 2 by RT PCR NEGATIVE NEGATIVE Final    Comment: (NOTE) SARS-CoV-2 target nucleic acids are NOT DETECTED.  The SARS-CoV-2 RNA is generally detectable in upper respiratory specimens during the acute phase of infection. The lowest concentration of SARS-CoV-2 viral copies  this assay can detect is 138 copies/mL. A negative result does not preclude SARS-Cov-2 infection and should not be used as the sole basis for treatment or other patient management decisions. A negative result may occur with  improper specimen collection/handling, submission of specimen other than nasopharyngeal swab, presence of viral mutation(s) within the areas targeted by this assay, and inadequate number of viral copies(<138 copies/mL). A negative result must be combined with clinical observations, patient history, and epidemiological information. The expected result is Negative.  Fact Sheet for Patients:  EntrepreneurPulse.com.au  Fact Sheet for Healthcare Providers:  IncredibleEmployment.be  This test is no t yet approved or cleared by the [place] FDA and  has been authorized for detection and/or diagnosis of SARS-CoV-2 by FDA under an Emergency Use Authorization (EUA). This EUA will remain  in effect (meaning this test can be used) for the duration of the COVID-19 declaration under Section 564(b)(1) of the Act, 21 U.S.C.section 360bbb-3(b)(1), unless the authorization is terminated  or revoked sooner.       Influenza A by PCR NEGATIVE NEGATIVE Final    Influenza B by PCR NEGATIVE NEGATIVE Final    Comment: (NOTE) The Xpert Xpress SARS-CoV-2/FLU/RSV plus assay is intended as an aid in the diagnosis of influenza from Nasopharyngeal swab specimens and should not be used as a sole basis for treatment. Nasal washings and aspirates are unacceptable for Xpert Xpress SARS-CoV-2/FLU/RSV testing.  Fact Sheet for Patients: EntrepreneurPulse.com.au  Fact Sheet for Healthcare Providers: IncredibleEmployment.be  This test is not yet approved or cleared by the [place] FDA and has been authorized for detection and/or diagnosis of SARS-CoV-2 by FDA under an Emergency Use Authorization (EUA). This EUA will remain in effect (meaning this test can be used) for the duration of the COVID-19 declaration under Section 564(b)(1) of the Act, 21 U.S.C. section 360bbb-3(b)(1), unless the authorization is terminated or revoked.  Performed at [facility], [address], [place], [place] [address]   CSF culture w Stat Gram Stain     Status: None (Preliminary result)   Collection Time: 04/13/21 10:15 AM   Specimen: CSF; Cerebrospinal Fluid  Result Value Ref Range Status   Specimen Description CSF  Final   Special Requests NONE  Final   Gram Stain   Final    CYTOSPIN SMEAR WBC PRESENT,BOTH PMN AND MONONUCLEAR NO ORGANISMS SEEN    Culture   Final    NO GROWTH 2 DAYS Performed at [facility], [place] [address] [place], [place] [address]    Report Status PENDING  Incomplete     Medications:    amLODipine  5 mg Oral Daily   capsaicin   Topical BID   gabapentin  200 mg Oral QHS   insulin aspart  0-5 Units Subcutaneous QHS   insulin aspart  0-6 Units Subcutaneous TID WC   insulin aspart  2 Units Subcutaneous TID WC   insulin detemir  4 Units Subcutaneous BID   levothyroxine  100 mcg Oral QAC breakfast   metoprolol succinate  50 mg Oral BID   Continuous Infusions:  sodium chloride 75 mL/hr at  04/15/21 1040       LOS: 4 days   [facility]  04/15/2021, 2:08 PM

## 2021-04-16 DIAGNOSIS — N1832 Chronic kidney disease, stage 3b: Secondary | ICD-10-CM | POA: Diagnosis not present

## 2021-04-16 DIAGNOSIS — R29898 Other symptoms and signs involving the musculoskeletal system: Secondary | ICD-10-CM | POA: Diagnosis not present

## 2021-04-16 DIAGNOSIS — I5032 Chronic diastolic (congestive) heart failure: Secondary | ICD-10-CM | POA: Diagnosis not present

## 2021-04-16 LAB — CSF CULTURE W GRAM STAIN: Culture: NO GROWTH

## 2021-04-16 LAB — BASIC METABOLIC PANEL
Anion gap: 7 (ref 5–15)
BUN: 52 mg/dL — ABNORMAL HIGH (ref 8–23)
CO2: 19 mmol/L — ABNORMAL LOW (ref 22–32)
Calcium: 8.5 mg/dL — ABNORMAL LOW (ref 8.9–10.3)
Chloride: 110 mmol/L (ref 98–111)
Creatinine, Ser: 2.22 mg/dL — ABNORMAL HIGH (ref 0.44–1.00)
GFR, Estimated: 20 mL/min — ABNORMAL LOW (ref >60)
Glucose, Bld: 202 mg/dL — ABNORMAL HIGH (ref 70–99)
Potassium: 5.3 mmol/L — ABNORMAL HIGH (ref 3.5–5.1)
Sodium: 136 mmol/L (ref 135–145)

## 2021-04-16 LAB — GLUCOSE, CAPILLARY
Glucose-Capillary: 154 mg/dL — ABNORMAL HIGH (ref 70–99)
Glucose-Capillary: 192 mg/dL — ABNORMAL HIGH (ref 70–99)
Glucose-Capillary: 273 mg/dL — ABNORMAL HIGH (ref 70–99)
Glucose-Capillary: 243 mg/dL — ABNORMAL HIGH (ref 70–99)
Glucose-Capillary: 323 mg/dL — ABNORMAL HIGH (ref 70–99)

## 2021-04-16 MED ORDER — SODIUM ZIRCONIUM CYCLOSILICATE 10 G PO PACK
10.0000 g | PACK | Freq: Once | ORAL | Status: AC
  Administered 2021-04-16: 10 g via ORAL
  Filled 2021-04-16: qty 1

## 2021-04-16 MED ORDER — DULOXETINE HCL 30 MG PO CPEP
30.0000 mg | ORAL_CAPSULE | Freq: Every day | ORAL | Status: AC
  Administered 2021-04-16 – 2021-04-18 (×3): 30 mg via ORAL
  Filled 2021-04-16 (×3): qty 1

## 2021-04-16 MED ORDER — GABAPENTIN 100 MG PO CAPS: 100.0000 mg | ORAL_CAPSULE | Freq: Every day | ORAL | Status: DC

## 2021-04-16 MED ORDER — ONDANSETRON HCL 4 MG/2ML IJ SOLN
4.0000 mg | Freq: Four times a day (QID) | INTRAMUSCULAR | Status: DC | PRN
  Administered 2021-04-16: 4 mg via INTRAVENOUS
  Filled 2021-04-16: qty 2

## 2021-04-16 MED ORDER — DULOXETINE HCL 60 MG PO CPEP
60.0000 mg | ORAL_CAPSULE | Freq: Every day | ORAL | Status: DC
  Filled 2021-04-16: qty 1

## 2021-04-16 MED ORDER — SODIUM CHLORIDE 0.9 % IV SOLN: INTRAVENOUS | Status: DC

## 2021-04-16 NOTE — Progress Notes (Signed)
[facility] PROGRESS NOTE    Progress Note  [name]  ZOX:[id number] DOB: February 18, 1928 DOA: 04/10/2021 PCP: Reynold Bowen, MD   Brief Narrative:   Catherine Mason is an 87 y.o. female past medical history significant for diabetes mellitus type 2 chronic kidney disease stage IIIb creatinine 1.6-1.8, CAD, Uterine cancer, essential hypertension chronic diastolic heart failure was transferred to [facility] from [facility] ED . -She presented with acute onset bilateral leg weakness X 1 day, she was ambulatory without issues the day prior to her symptoms  -MRI brain and entire spine was negative for acute findings -  Significant studies: MRI of the brain showed no stroke. MRI of the lumbar spine shows severe osteoarthritis.  With mild neuroforaminal stenosis at L1-L2 L2-L3 and L4-L5 MRI of the cervical C-spine showed multiple spinal level canal stenosis most notably and C5-C6 MRI of the thoracic spine showed no myelopathy or spinal cord dysfunction no evidence of impingement.  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 -MRI brain, cervical thoracic and lumbar spine were unrevealing, degenerative changes noted -CT abdomen pelvis without evidence of malignancy,  -LP was completed 10/11, CSF with increase RBCs, Bloody tap, protein is normal -CK was 36, electrolytes were unremarkable  -Started on low-dose gabapentin yesterday, however appears more drowsy, especially with worsening creatinine and decreased clearance, will decrease gabapentin dose to 100 mg nightly, also started on capsaicin cream, also started on Cymbalta by neurology -B12 within normal limits -PT OT following, now plan for SNF for rehab  AKI on chronic kidney disease stage IIIb: -baseline on admission 1.8. -Creatinine trended to 2.2, add gentle IV fluids, check bladder scan to rule out obstruction  Diabetes mellitus type II uncontrolled: -CBGs relatively stable,  continue Levemir and sliding scale  Essential  HTN (hypertension) -Continue amlodipine and Toprol  Hypothyroidism -TSH is high, increased Synthroid dose  Chronic diastolic heart failure: -Clinically appears euvolemic  Diabetes mellitus type 2 with hyperglycemia: A1C of 9 she was started on low-dose long-acting insulin continue sliding scale.   DVT prophylaxis: lovenox  Code status: Full Code Family Communication: No family at bedside, will: Updated son Status is: Inpatient  Remains inpatient appropriate due to severity of illness  Dispo: The patient is from: Home              Anticipated d/c is to: SNF in 48 hours if stable              Patient currently is not medically stable to d/c.   Difficult to place patient No     Procedures and diagnostic studies:  LP 10/11   Medical Consultants:   Neurology   Subjective:    Catherine Mason sleepy and tired this morning, leg pain is improving  Objective:    Vitals:   04/16/21 0355 04/16/21 0619 04/16/21 0733 04/16/21 1143  BP: (!) 115/51  (!) 114/45 (!) 109/47  Pulse: 77  75 75  Resp: 17  18 17   Temp: 98.2 F (36.8 C)  98 F (36.7 C) 97.8 F (36.6 C)  TempSrc: Oral  Oral Oral  SpO2: 97%  97% 98%  Weight:  68.6 kg    Height:       SpO2: 98 %   Intake/Output Summary (Last 24 hours) at 04/16/2021 1356 Last data filed at 04/16/2021 0745 Gross per 24 hour  Intake 100 ml  Output --  Net 100 ml   Filed Weights   04/12/21 0343 04/13/21  8938 04/16/21 0619  Weight: 66.3 kg 66.8 kg 68.6 kg    Exam: Gen: Pleasant elderly female sitting up in the recliner, drowsy but easily arousable, oriented x3 HEENT: No JVD CVS: S1-S2, regular rate rhythm Lungs: Clear bilaterally Abdomen: Soft, nontender, bowel sounds present Extremities: No edema Skin: no new rashes on exposed skin  Neuro: mild lower leg weakness, decreased pin prick and paresthesias  Data Reviewed:    Labs: Basic Metabolic Panel: Recent Labs   Lab 04/11/21 0507 04/12/21 1040 04/13/21 0305 04/15/21 0353 04/16/21 0918  NA 139 137 135 135 136  K 4.9 4.9 4.4 5.1 5.3*  CL 113* 108 110 109 110  CO2 19* 20* 18* 19* 19*  GLUCOSE 206* 218* 175* 212* 202*  BUN 27* 27* 30* 42* 52*  CREATININE 1.57* 1.66* 1.79* 1.93* 2.22*  CALCIUM 8.8* 8.9 8.3* 8.6* 8.5*  MG 2.1  --   --   --   --    GFR Estimated Creatinine Clearance: 15.7 mL/min (A) (by C-G formula based on SCr of 2.22 mg/dL (H)). Liver Function Tests: Recent Labs  Lab 04/10/21 1457 04/11/21 0507  AST 25 18  ALT 23 17  ALKPHOS 156* 128*  BILITOT 0.9 0.8  PROT 7.4 6.2*  ALBUMIN 3.8 2.9*   No results for input(s): LIPASE, AMYLASE in the last 168 hours. No results for input(s): AMMONIA in the last 168 hours. Coagulation profile Recent Labs  Lab 04/10/21 1457  INR 1.0   COVID-19 Labs  No results for input(s): DDIMER, FERRITIN, LDH, CRP in the last 72 hours.   Lab Results  Component Value Date   [place] NEGATIVE 04/10/2021    CBC: Recent Labs  Lab 04/10/21 1513 04/10/21 1558 04/11/21 0507 04/15/21 0353  WBC  --  6.8 6.4 6.9  NEUTROABS  --  3.6  --   --   HGB 13.6 12.3 12.3 11.2*  HCT 40.0 40.0 38.3 35.1*  MCV  --  98.8 94.8 94.4  PLT  --  153 160 154   Cardiac Enzymes: Recent Labs  Lab 04/11/21 1651  CKTOTAL 36*   BNP (last 3 results) No results for input(s): PROBNP in the last 8760 hours. CBG: Recent Labs  Lab 04/15/21 1134 04/15/21 1647 04/15/21 2110 04/16/21 0641 04/16/21 1219  GLUCAP 216* 236* 246* 154* 192*   D-Dimer: No results for input(s): DDIMER in the last 72 hours. Hgb A1c: No results for input(s): HGBA1C in the last 72 hours.  Lipid Profile: No results for input(s): CHOL, HDL, LDLCALC, TRIG, CHOLHDL, LDLDIRECT in the last 72 hours. Thyroid function studies: Recent Labs    04/14/21 0818  TSH 25.709*   Anemia work up: Recent Labs    04/14/21 0818 04/15/21 0353  VITAMINB12 400 333   Sepsis Labs: Recent  Labs  Lab 04/10/21 1558 04/11/21 0507 04/15/21 0353  WBC 6.8 6.4 6.9   Microbiology Recent Results (from the past 240 hour(s))  Resp Panel by RT-PCR (Flu A&B, Covid) Nasopharyngeal Swab     Status: None   Collection Time: 04/10/21  2:54 PM   Specimen: Nasopharyngeal Swab; Nasopharyngeal(NP) swabs in vial transport medium  Result Value Ref Range Status   SARS Coronavirus 2 by RT PCR NEGATIVE NEGATIVE Final    Comment: (NOTE) SARS-CoV-2 target nucleic acids are NOT DETECTED.  The SARS-CoV-2 RNA is generally detectable in upper respiratory specimens during the acute phase of infection. The lowest concentration of SARS-CoV-2 viral copies this assay can detect is 138 copies/mL. A negative result  does not preclude SARS-Cov-2 infection and should not be used as the sole basis for treatment or other patient management decisions. A negative result may occur with  improper specimen collection/handling, submission of specimen other than nasopharyngeal swab, presence of viral mutation(s) within the areas targeted by this assay, and inadequate number of viral copies(<138 copies/mL). A negative result must be combined with clinical observations, patient history, and epidemiological information. The expected result is Negative.  Fact Sheet for Patients:  EntrepreneurPulse.com.au  Fact Sheet for Healthcare Providers:  IncredibleEmployment.be  This test is no t yet approved or cleared by the [place] FDA and  has been authorized for detection and/or diagnosis of SARS-CoV-2 by FDA under an Emergency Use Authorization (EUA). This EUA will remain  in effect (meaning this test can be used) for the duration of the COVID-19 declaration under Section 564(b)(1) of the Act, 21 U.S.C.section 360bbb-3(b)(1), unless the authorization is terminated  or revoked sooner.       Influenza A by PCR NEGATIVE NEGATIVE Final   Influenza B by PCR NEGATIVE NEGATIVE Final     Comment: (NOTE) The Xpert Xpress SARS-CoV-2/FLU/RSV plus assay is intended as an aid in the diagnosis of influenza from Nasopharyngeal swab specimens and should not be used as a sole basis for treatment. Nasal washings and aspirates are unacceptable for Xpert Xpress SARS-CoV-2/FLU/RSV testing.  Fact Sheet for Patients: EntrepreneurPulse.com.au  Fact Sheet for Healthcare Providers: IncredibleEmployment.be  This test is not yet approved or cleared by the [place] FDA and has been authorized for detection and/or diagnosis of SARS-CoV-2 by FDA under an Emergency Use Authorization (EUA). This EUA will remain in effect (meaning this test can be used) for the duration of the COVID-19 declaration under Section 564(b)(1) of the Act, 21 U.S.C. section 360bbb-3(b)(1), unless the authorization is terminated or revoked.  Performed at [facility], [address], [place], [place] [address]   CSF culture w Stat Gram Stain     Status: None   Collection Time: 04/13/21 10:15 AM   Specimen: CSF; Cerebrospinal Fluid  Result Value Ref Range Status   Specimen Description CSF  Final   Special Requests NONE  Final   Gram Stain   Final    CYTOSPIN SMEAR WBC PRESENT,BOTH PMN AND MONONUCLEAR NO ORGANISMS SEEN    Culture   Final    NO GROWTH 3 DAYS Performed at [facility], [place] [address], [place], [place] [address]    Report Status 04/16/2021 FINAL  Final     Medications:    amLODipine  5 mg Oral Daily   capsaicin   Topical BID   DULoxetine  30 mg Oral Daily   [START ON 04/19/2021] DULoxetine  60 mg Oral Daily   insulin aspart  0-5 Units Subcutaneous QHS   insulin aspart  0-6 Units Subcutaneous TID WC   insulin aspart  2 Units Subcutaneous TID WC   insulin detemir  4 Units Subcutaneous BID   levothyroxine  100 mcg Oral QAC breakfast   metoprolol succinate  50 mg Oral BID   Continuous Infusions:  sodium chloride 75 mL/hr at 04/16/21 1338        LOS: 5 days   [facility]  04/16/2021, 1:56 PM

## 2021-04-16 NOTE — Progress Notes (Signed)
Neurology Progress Note  S: Patient is sleepy today, thinks it is from the Gabapentin. She replies "I don't know" when asked about pain to her LEs.   O: Current vital signs: BP (!) 109/47 (BP Location: Right Arm)   Pulse 75   Temp 97.8 F (36.6 C) (Oral)   Resp 17   Ht 5\' 4"  (1.626 m)   Wt 68.6 kg   SpO2 98%   BMI 25.96 kg/m  Vital signs in last 24 hours: Temp:  [97.8 F (36.6 C)-98.6 F (37 C)] 97.8 F (36.6 C) (10/14 1143) Pulse Rate:  [71-77] 75 (10/14 1143) Resp:  [17-18] 17 (10/14 1143) BP: (106-115)/(43-51) 109/47 (10/14 1143) SpO2:  [97 %-100 %] 98 % (10/14 1143) Weight:  [68.6 kg] 68.6 kg (10/14 0619)  GENERAL: Poor appearing elderly female, sleepy. In NAD.  HEENT: Normocephalic and atraumatic. LUNGS: Normal respiratory effort.  CV: RRR on tele.  Ext: warm.  NEURO:  Fluency intact. Comprehension intact. No aphasia or dysarthria. Follows commands.   Cranial Nerves:  Face is symmetrical. Eyelids elevate symmetrically. Sensation to face intact.   Motor:  RUE: grip  3    RLE: plantar flexion 1/5.    LUE: grip 3    LLE: plantar flexion 0/5 Tone: is normal and bulk is normal. Sensation- Intact to light touch bilaterally. Decreased cold sensation bilateral LEs below knees.    Medications  Current Facility-Administered Medications:    0.9 %  sodium chloride infusion, , Intravenous, Continuous, Domenic Polite, MD, Last Rate: 75 mL/hr at 04/16/21 1338, New Bag at 04/16/21 1338   acetaminophen (TYLENOL) tablet 650 mg, 650 mg, Oral, Q6H PRN **OR** acetaminophen (TYLENOL) suppository 650 mg, 650 mg, Rectal, Q6H PRN, Howerter, Justin B, DO   amLODipine (NORVASC) tablet 5 mg, 5 mg, Oral, Daily, Howerter, Justin B, DO, 5 mg at 04/15/21 0929   capsaicin (ZOSTRIX) 0.025 % cream, , Topical, BID, Domenic Polite, MD, Given at 04/16/21 1100   DULoxetine (CYMBALTA) DR capsule 30 mg, 30 mg, Oral, Daily, Kirby-Graham, Karsten Fells, NP, 30 mg at 04/16/21 1331   [START ON 04/19/2021]  DULoxetine (CYMBALTA) DR capsule 60 mg, 60 mg, Oral, Daily, Kirby-Graham, Karsten Fells, NP   food thickener (SIMPLYTHICK (NECTAR/LEVEL 2/MILDLY THICK)) 1 packet, 1 packet, Oral, PRN, Charlynne Cousins, MD   insulin aspart (novoLOG) injection 0-5 Units, 0-5 Units, Subcutaneous, QHS, Charlynne Cousins, MD, 2 Units at 04/15/21 2200   insulin aspart (novoLOG) injection 0-6 Units, 0-6 Units, Subcutaneous, TID WC, Charlynne Cousins, MD, 1 Units at 04/16/21 1320   insulin aspart (novoLOG) injection 2 Units, 2 Units, Subcutaneous, TID WC, Charlynne Cousins, MD, 2 Units at 04/16/21 1319   insulin detemir (LEVEMIR) injection 4 Units, 4 Units, Subcutaneous, BID, Charlynne Cousins, MD, 4 Units at 04/16/21 1057   levothyroxine (SYNTHROID) tablet 100 mcg, 100 mcg, Oral, QAC breakfast, Domenic Polite, MD, 100 mcg at 04/16/21 0612   metoprolol succinate (TOPROL-XL) 24 hr tablet 50 mg, 50 mg, Oral, BID, Howerter, Justin B, DO, 50 mg at 04/15/21 2217  No new pertinent labs.  No new Imaging  Assessment: 87 yo with progressive weakness and worsening gait x 4-6 weeks, with rapid worsening x one week prior to admission. Findings on exam are limited by pain. Presentation is felt to be most consistent with length dependent neuropathy weakness due to pain secondary by elevated A1c and decreased temperature and light touch sensation, progressive decline, and sensory changes. However, her neoplastic panel has not returned, but  doubt this is the etiology.   Recommendations/Plan:  -TSH per primary team.  -Continue Capsaicn cream to LEs.  -d/c Gabapentin and start Duloxetine 30mg  po qd x 3, then increase to 60mg  po qd (order placed). -Continue PT/OT. Patient will likely need SNF.   Pt seen by Clance Boll, MSN, APN-BC/Nurse Practitioner/Neuro.   Pager: [phone]

## 2021-04-16 NOTE — Plan of Care (Signed)

## 2021-04-16 NOTE — NC FL2 (Signed)
[place] LEVEL OF CARE SCREENING TOOL     IDENTIFICATION  Patient Name: Catherine Mason Birthdate: 12-Oct-1927 Sex: female Admission Date (Current Location): 04/10/2021  [facility] and [place] Number:  Herbalist and Address:  [facility], [place] [address], [place], [place] [address]      Provider Number: [id number]  Attending Physician Name and Address:  Domenic Polite, MD  Relative Name and Phone Number:       Current Level of Care: Hospital Recommended Level of Care: [place] Prior Approval Number:    Date Approved/Denied:   PASRR Number:    Discharge Plan: SNF    Current Diagnoses: Patient Active Problem List   Diagnosis Date Noted   Lower extremity weakness 04/11/2021   RTA (renal tubular acidosis) 04/11/2021   DM2 (diabetes mellitus, type 2) (HCC)    Ventral hernia    Uterine cancer (HCC)    Thyroid disease    Diverticular disease    Anemia    Chronic diastolic CHF (congestive heart failure) ([place]) 02/09/2015   Ischemic cardiomyopathy 01/15/2013   Hypothyroid    NSTEMI (non-ST elevated myocardial infarction) ([place]) 01/12/2013   CAD (coronary artery disease)    HTN (hypertension)    Hyperlipidemia    SVT (supraventricular tachycardia) (HCC)    CKD (chronic kidney disease), stage III ([place])    Abdominal wall hernia at previous stoma site 04/21/2011    Orientation RESPIRATION BLADDER Height & Weight     Self  Normal Incontinent Weight: 151 lb 3.8 oz (68.6 kg) Height:  5\' 4"  (162.6 cm)  BEHAVIORAL SYMPTOMS/MOOD NEUROLOGICAL BOWEL NUTRITION STATUS      Continent Diet (see DC summary)  AMBULATORY STATUS COMMUNICATION OF NEEDS Skin   Extensive Assist Verbally Normal                       Personal Care Assistance Level of Assistance  Bathing, Feeding, Dressing Bathing Assistance: Maximum assistance Feeding assistance: Limited assistance Dressing Assistance: Maximum assistance      Functional Limitations Info  Sight Sight Info: Impaired        SPECIAL CARE FACTORS FREQUENCY  PT (By licensed PT), OT (By licensed OT), Speech therapy     PT Frequency: 5x/wk OT Frequency: 5x/wk     Speech Therapy Frequency: 5x/wk      Contractures Contractures Info: Not present    Additional Factors Info  Code Status, Allergies, Psychotropic, Insulin Sliding Scale Code Status Info: Full Allergies Info: Shellfish Allergy Psychotropic Info: Cymbalta 30mg  daily Insulin Sliding Scale Info: see DC summary       Current Medications (04/16/2021):  This is the current hospital active medication list Current Facility-Administered Medications  Medication Dose Route Frequency Provider Last Rate Last Admin   0.9 %  sodium chloride infusion   Intravenous Continuous Domenic Polite, MD       acetaminophen (TYLENOL) tablet 650 mg  650 mg Oral Q6H PRN Howerter, Justin B, DO       Or   acetaminophen (TYLENOL) suppository 650 mg  650 mg Rectal Q6H PRN Howerter, Justin B, DO       amLODipine (NORVASC) tablet 5 mg  5 mg Oral Daily Howerter, Justin B, DO   5 mg at 04/15/21 0929   capsaicin (ZOSTRIX) 0.025 % cream   Topical BID Domenic Polite, MD   Given at 04/16/21 1100   DULoxetine (CYMBALTA) DR capsule 30 mg  30 mg Oral Daily Kirby-Graham,  Karsten Fells, NP       Derrill Memo ON 04/19/2021] DULoxetine (CYMBALTA) DR capsule 60 mg  60 mg Oral Daily Kirby-Graham, Karsten Fells, NP       food thickener (SIMPLYTHICK (NECTAR/LEVEL 2/MILDLY THICK)) 1 packet  1 packet Oral PRN Charlynne Cousins, MD       insulin aspart (novoLOG) injection 0-5 Units  0-5 Units Subcutaneous QHS Charlynne Cousins, MD   2 Units at 04/15/21 2200   insulin aspart (novoLOG) injection 0-6 Units  0-6 Units Subcutaneous TID WC Charlynne Cousins, MD   1 Units at 04/16/21 0730   insulin aspart (novoLOG) injection 2 Units  2 Units Subcutaneous TID WC Charlynne Cousins, MD   2 Units at 04/16/21 0731   insulin detemir (LEVEMIR)  injection 4 Units  4 Units Subcutaneous BID Charlynne Cousins, MD   4 Units at 04/16/21 1057   levothyroxine (SYNTHROID) tablet 100 mcg  100 mcg Oral QAC breakfast Domenic Polite, MD   100 mcg at 04/16/21 0612   metoprolol succinate (TOPROL-XL) 24 hr tablet 50 mg  50 mg Oral BID Howerter, Justin B, DO   50 mg at 04/15/21 2217   sodium zirconium cyclosilicate (LOKELMA) packet 10 g  10 g Oral Once Domenic Polite, MD         Discharge Medications: Please see discharge summary for a list of discharge medications.  Relevant Imaging Results:  Relevant Lab Results:   Additional Information SS#: [id number]  Geralynn Ochs, LCSW

## 2021-04-16 NOTE — Progress Notes (Signed)
Physical Therapy Treatment Patient Details Name: Catherine Mason MRN: [id number] DOB: 1927-07-17 Today's Date: 04/16/2021   History of Present Illness Pt is a 87 y/o female who presents with bilateral lower extremity weakness after presenting to [facility] ED via [facility]ED [facility] from [facility]. Imaging revealed no abnormalities in the brain but did reveal degenerative changes to the cervical and thoracic spine. PMH significant for type 2 diabetes mellitus, stage IIIb chronic kidney disease with baseline creatinine 1.6-1.8, HTN, chronic diastolic heart failure, dysphagia.    PT Comments    Pt received in supine, lethargic and c/o severe BLE pain (worse at B ankles) despite premedication. Pt with good participation as able despite limitations above and needing mod/maxA for rolling in supine and AAROM for BLE strengthening exercises. Pt limited due to bowel urgency/incontinence and remained on bed pan at end of session, NT notified. Pt continues to benefit from PT services to progress toward functional mobility goals.    Recommendations for follow up therapy are one component of a multi-disciplinary discharge planning process, led by the attending physician.  Recommendations may be updated based on patient status, additional functional criteria and insurance authorization.  Follow Up Recommendations  CIR (pending progress next session, pt medication causing lethargy today)     Equipment Recommendations  Other (comment) (TBD by next venue of care)    Recommendations for Other Services       Precautions / Restrictions Precautions Precautions: Fall Precaution Comments: Painful B feet - R worse than L Restrictions Weight Bearing Restrictions: No     Mobility  Bed Mobility Overal bed mobility: Needs Assistance Bed Mobility: Rolling Rolling: Mod assist;Max assist         General bed mobility comments: pt rolling 3x for repositioning and hygiene assist, pt limited due to lethargy and confusion  needing maxA to reach L sidelying. frequent loose stools noted and pt remained on bed pan at end of session.    Transfers      General transfer comment: pt too lethargic to attempt, also c/o severe pain with minimal BLE movement, RN aware      Balance       Sitting balance - Comments: pt unable to tolerate more than rolling L/R this date due to pain and bowel incontinence limiting       Cognition Arousal/Alertness: Lethargic;Suspect due to medications Behavior During Therapy: Flat affect Overall Cognitive Status: No family/caregiver present to determine baseline cognitive functioning        General Comments: Pt with slow processing of 1-step cues, needs multimodal cues for sequencing bed mobility/rolling and possible visual deficit vs dysmetria, pt reports unable to locate rail with RUE although LUE is already holding bed rail.      Exercises Other Exercises Other Exercises: supine BLE A/AAROM: ankle pumps (AA, very limited movement with AROM), heel slides, hip abduction (needs AA on L to achieve movement) x 10 reps ea, pt c/o pain throughout despite premedication with gabapentin and capsaicin cream Other Exercises: BUE AROM pulling forward/backward on bed rail in sidelying x5 reps ea side    General Comments General comments (skin integrity, edema, etc.): BP 113/48 (65); HR 76 bpm supine      Pertinent Vitals/Pain Pain Assessment: 0-10 Pain Score: 8  Pain Location: B ankles with A/AAROM, generalized LE pain with AROM supine exercises Pain Descriptors / Indicators: Sore;Sharp;Discomfort;Grimacing;Moaning Pain Intervention(s): Limited activity within patient's tolerance;Monitored during session;Premedicated before session;Repositioned;RN gave pain meds during session (capsaicin ointment for B ankles placed by RN during  session, pt reports no change with ankle pumps after this)     PT Goals (current goals can now be found in the care plan section) Acute Rehab PT  Goals Patient Stated Goal: Be able to walk again PT Goal Formulation: With patient/family Time For Goal Achievement: 04/19/21 Progress towards PT goals: Not progressing toward goals - comment (lethargy today -per RN likely due to meds)    Frequency    Min 3X/week      PT Plan Current plan remains appropriate (pending progress next session)       AM-PAC PT "6 Clicks" Mobility   Outcome Measure  Help needed turning from your back to your side while in a flat bed without using bedrails?: A Lot Help needed moving from lying on your back to sitting on the side of a flat bed without using bedrails?: Total Help needed moving to and from a bed to a chair (including a wheelchair)?: Total Help needed standing up from a chair using your arms (e.g., wheelchair or bedside chair)?: Total Help needed to walk in hospital room?: Total Help needed climbing 3-5 steps with a railing? : Total 6 Click Score: 7    End of Session   Activity Tolerance: Patient limited by lethargy;Patient limited by pain Patient left: in bed;with call bell/phone within reach;with bed alarm set;Other (comment) (on bed pan, NT notified) Nurse Communication: Mobility status PT Visit Diagnosis: Unsteadiness on feet (R26.81);Muscle weakness (generalized) (M62.81);Difficulty in walking, not elsewhere classified (R26.2)     Time: 1696-7893 PT Time Calculation (min) (ACUTE ONLY): 21 min  Charges:  $Therapeutic Exercise: 8-22 mins                     Zamzam Whinery P., PTA Acute Rehabilitation Services Pager: [phone] Office: [place] 04/16/2021, 11:35 AM

## 2021-04-16 NOTE — Progress Notes (Signed)
Inpatient Rehabilitation Admissions Coordinator  I contacted pt's grandson, Saralyn Pilar , by phone . He and family unable to provide the 24/7 physical caregiver support that we feel she would need after any rehab stay, therefore he would like to pursue SNF. I will alert acute team and TOC. We will sign off at this time.  Danne Baxter, RN, MSN Rehab Admissions Coordinator [phone] 04/16/2021 12:08 PM

## 2021-04-17 DIAGNOSIS — I5032 Chronic diastolic (congestive) heart failure: Secondary | ICD-10-CM | POA: Diagnosis not present

## 2021-04-17 DIAGNOSIS — E119 Type 2 diabetes mellitus without complications: Secondary | ICD-10-CM | POA: Diagnosis not present

## 2021-04-17 DIAGNOSIS — R29898 Other symptoms and signs involving the musculoskeletal system: Secondary | ICD-10-CM | POA: Diagnosis not present

## 2021-04-17 LAB — BASIC METABOLIC PANEL
Anion gap: 6 (ref 5–15)
BUN: 53 mg/dL — ABNORMAL HIGH (ref 8–23)
CO2: 18 mmol/L — ABNORMAL LOW (ref 22–32)
Calcium: 8.2 mg/dL — ABNORMAL LOW (ref 8.9–10.3)
Chloride: 113 mmol/L — ABNORMAL HIGH (ref 98–111)
Creatinine, Ser: 2.1 mg/dL — ABNORMAL HIGH (ref 0.44–1.00)
GFR, Estimated: 22 mL/min — ABNORMAL LOW (ref >60)
Glucose, Bld: 291 mg/dL — ABNORMAL HIGH (ref 70–99)
Potassium: 5.6 mmol/L — ABNORMAL HIGH (ref 3.5–5.1)
Sodium: 137 mmol/L (ref 135–145)

## 2021-04-17 LAB — GLUCOSE, CAPILLARY
Glucose-Capillary: 259 mg/dL — ABNORMAL HIGH (ref 70–99)
Glucose-Capillary: 249 mg/dL — ABNORMAL HIGH (ref 70–99)
Glucose-Capillary: 209 mg/dL — ABNORMAL HIGH (ref 70–99)
Glucose-Capillary: 182 mg/dL — ABNORMAL HIGH (ref 70–99)

## 2021-04-17 LAB — CBC
HCT: 33 % — ABNORMAL LOW (ref 36.0–46.0)
Hemoglobin: 10.3 g/dL — ABNORMAL LOW (ref 12.0–15.0)
MCH: 30.6 pg (ref 26.0–34.0)
MCHC: 31.2 g/dL (ref 30.0–36.0)
MCV: 97.9 fL (ref 80.0–100.0)
Platelets: 170 10*3/uL (ref 150–400)
RBC: 3.37 MIL/uL — ABNORMAL LOW (ref 3.87–5.11)
RDW: 13.2 % (ref 11.5–15.5)
WBC: 5 10*3/uL (ref 4.0–10.5)
nRBC: 0 % (ref 0.0–0.2)

## 2021-04-17 MED ORDER — SODIUM CHLORIDE 0.9 % IV SOLN: INTRAVENOUS | Status: DC

## 2021-04-17 MED ORDER — POLYVINYL ALCOHOL 1.4 % OP SOLN
1.0000 [drp] | OPHTHALMIC | Status: DC | PRN
  Administered 2021-04-17 – 2021-04-18 (×3): 1 [drp] via OPHTHALMIC
  Filled 2021-04-17 (×2): qty 15

## 2021-04-17 MED ORDER — SODIUM CHLORIDE 0.9 % IV SOLN: INTRAVENOUS | Status: AC

## 2021-04-17 MED ORDER — SODIUM ZIRCONIUM CYCLOSILICATE 10 G PO PACK
10.0000 g | PACK | Freq: Once | ORAL | Status: AC
  Administered 2021-04-17: 10 g via ORAL
  Filled 2021-04-17: qty 1

## 2021-04-17 MED ORDER — INSULIN DETEMIR 100 UNIT/ML ~~LOC~~ SOLN
6.0000 [IU] | Freq: Two times a day (BID) | SUBCUTANEOUS | Status: DC
  Administered 2021-04-17 – 2021-04-20 (×8): 6 [IU] via SUBCUTANEOUS
  Filled 2021-04-17 (×9): qty 0.06

## 2021-04-17 NOTE — Progress Notes (Signed)
[facility] PROGRESS NOTE    Progress Note  [name]  JAS:[id number] DOB: 08/02/1927 DOA: 04/10/2021 PCP: Reynold Bowen, MD   Brief Narrative:   Catherine Mason is an 87 y.o. female past medical history significant for diabetes mellitus type 2 chronic kidney disease stage IIIb creatinine 1.6-1.8, CAD, Uterine cancer, essential hypertension chronic diastolic heart failure was transferred to [facility] from [facility] ED . -She presented with acute onset bilateral leg weakness X 1 day, she was ambulatory without issues the day prior to her symptoms  -MRI brain and entire spine was negative for acute findings -  Significant studies: MRI of the brain showed no stroke. MRI of the lumbar spine shows severe osteoarthritis.  With mild neuroforaminal stenosis at L1-L2 L2-L3 and L4-L5 MRI of the cervical C-spine showed multiple spinal level canal stenosis most notably and C5-C6 MRI of the thoracic spine showed no myelopathy or spinal cord dysfunction no evidence of impingement.  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 -MRI brain, cervical thoracic and lumbar spine were unrevealing, degenerative changes noted -CT abdomen pelvis without evidence of malignancy,  -LP was completed 10/11, CSF with increase RBCs, Bloody tap, protein is normal -CK was 36, electrolytes were unremarkable  -Started on low-dose gabapentin however had resultant lethargy worsened by AKI and decreased clearance, now discontinued, started on low-dose Cymbalta by neurology, also continue capsaicin cream -B12 within normal limits -PT OT following, now plan for SNF for rehab  AKI on chronic kidney disease stage IIIb: Hyperkalemia -baseline on admission 1.8. -Creatinine trended to 2.2,, 2.1 today, check bladder scan to rule out obstruction -Continue gentle IV fluids 1 more day, Lokelma x1 today, BMP in a.m.  Diabetes mellitus type II uncontrolled: -CBGs  elevated, continue Levemir and sliding scale, increased dose  Essential  HTN (hypertension) -Continue amlodipine and Toprol  Hypothyroidism -TSH is high, increased Synthroid dose  Chronic diastolic heart failure: -Clinically appears euvolemic  DVT prophylaxis: lovenox  Code status: Full Code Family Communication: Discussed with stepson at bedside Status is: Inpatient  Remains inpatient appropriate due to severity of illness  Dispo: The patient is from: Home              Anticipated d/c is to: SNF early next week if stable              Patient currently is not medically stable to d/c.   Difficult to place patient No     Procedures and diagnostic studies:  LP 10/11   Medical Consultants:   Neurology   Subjective:    Catherine Mason more awake today, leg pain is improving  Objective:    Vitals:   04/17/21 0004 04/17/21 0356 04/17/21 0837 04/17/21 1111  BP: (!) 107/48 (!) 112/52 (!) 117/45 (!) 111/51  Pulse: 77 71 72 70  Resp: 18 18    Temp: 98.2 F (36.8 C) 98 F (36.7 C) 98.1 F (36.7 C) 98 F (36.7 C)  TempSrc: Oral Oral Oral Oral  SpO2: 96% 97% 98% 97%  Weight:      Height:       SpO2: 97 %   Intake/Output Summary (Last 24 hours) at 04/17/2021 1340 Last data filed at 04/16/2021 1422 Gross per 24 hour  Intake 60 ml  Output --  Net 60 ml   Filed Weights   04/12/21 0343 04/13/21 0415 04/16/21 0619  Weight: 66.3 kg 66.8 kg 68.6 kg    Exam: Gen: Elderly female  sitting up in bed, more awake and alert today, no distress HEENT: No JVD CVS: S1-S2, regular rate rhythm Lungs: Clear bilaterally Abdomen: Soft, nontender, bowel sounds present Extremities: Mild edema in both upper extremities, no lower extremity edema  Skin: no new rashes on exposed skin  Neuro: mild lower leg weakness, decreased pin prick and paresthesias  Data Reviewed:    Labs: Basic Metabolic Panel: Recent Labs  Lab 04/11/21 0507 04/12/21 1040 04/13/21 0305  04/15/21 0353 04/16/21 0918 04/17/21 0428  NA 139 137 135 135 136 137  K 4.9 4.9 4.4 5.1 5.3* 5.6*  CL 113* 108 110 109 110 113*  CO2 19* 20* 18* 19* 19* 18*  GLUCOSE 206* 218* 175* 212* 202* 291*  BUN 27* 27* 30* 42* 52* 53*  CREATININE 1.57* 1.66* 1.79* 1.93* 2.22* 2.10*  CALCIUM 8.8* 8.9 8.3* 8.6* 8.5* 8.2*  MG 2.1  --   --   --   --   --    GFR Estimated Creatinine Clearance: 16.6 mL/min (A) (by C-G formula based on SCr of 2.1 mg/dL (H)). Liver Function Tests: Recent Labs  Lab 04/10/21 1457 04/11/21 0507  AST 25 18  ALT 23 17  ALKPHOS 156* 128*  BILITOT 0.9 0.8  PROT 7.4 6.2*  ALBUMIN 3.8 2.9*   No results for input(s): LIPASE, AMYLASE in the last 168 hours. No results for input(s): AMMONIA in the last 168 hours. Coagulation profile Recent Labs  Lab 04/10/21 1457  INR 1.0   COVID-19 Labs  No results for input(s): DDIMER, FERRITIN, LDH, CRP in the last 72 hours.   Lab Results  Component Value Date   [place] NEGATIVE 04/10/2021    CBC: Recent Labs  Lab 04/10/21 1513 04/10/21 1558 04/11/21 0507 04/15/21 0353 04/17/21 0428  WBC  --  6.8 6.4 6.9 5.0  NEUTROABS  --  3.6  --   --   --   HGB 13.6 12.3 12.3 11.2* 10.3*  HCT 40.0 40.0 38.3 35.1* 33.0*  MCV  --  98.8 94.8 94.4 97.9  PLT  --  153 160 154 170   Cardiac Enzymes: Recent Labs  Lab 04/11/21 1651  CKTOTAL 36*   BNP (last 3 results) No results for input(s): PROBNP in the last 8760 hours. CBG: Recent Labs  Lab 04/16/21 1725 04/16/21 2056 04/16/21 2226 04/17/21 0621 04/17/21 1113  GLUCAP 273* 243* 323* 259* 249*   D-Dimer: No results for input(s): DDIMER in the last 72 hours. Hgb A1c: No results for input(s): HGBA1C in the last 72 hours.  Lipid Profile: No results for input(s): CHOL, HDL, LDLCALC, TRIG, CHOLHDL, LDLDIRECT in the last 72 hours. Thyroid function studies: No results for input(s): TSH, T4TOTAL, T3FREE, THYROIDAB in the last 72 hours.  Invalid input(s):  FREET3  Anemia work up: Recent Labs    04/15/21 0353  VITAMINB12 333   Sepsis Labs: Recent Labs  Lab 04/10/21 1558 04/11/21 0507 04/15/21 0353 04/17/21 0428  WBC 6.8 6.4 6.9 5.0   Microbiology Recent Results (from the past 240 hour(s))  Resp Panel by RT-PCR (Flu A&B, Covid) Nasopharyngeal Swab     Status: None   Collection Time: 04/10/21  2:54 PM   Specimen: Nasopharyngeal Swab; Nasopharyngeal(NP) swabs in vial transport medium  Result Value Ref Range Status   SARS Coronavirus 2 by RT PCR NEGATIVE NEGATIVE Final    Comment: (NOTE) SARS-CoV-2 target nucleic acids are NOT DETECTED.  The SARS-CoV-2 RNA is generally detectable in upper respiratory specimens during the acute phase  of infection. The lowest concentration of SARS-CoV-2 viral copies this assay can detect is 138 copies/mL. A negative result does not preclude SARS-Cov-2 infection and should not be used as the sole basis for treatment or other patient management decisions. A negative result may occur with  improper specimen collection/handling, submission of specimen other than nasopharyngeal swab, presence of viral mutation(s) within the areas targeted by this assay, and inadequate number of viral copies(<138 copies/mL). A negative result must be combined with clinical observations, patient history, and epidemiological information. The expected result is Negative.  Fact Sheet for Patients:  EntrepreneurPulse.com.au  Fact Sheet for Healthcare Providers:  IncredibleEmployment.be  This test is no t yet approved or cleared by the [place] FDA and  has been authorized for detection and/or diagnosis of SARS-CoV-2 by FDA under an Emergency Use Authorization (EUA). This EUA will remain  in effect (meaning this test can be used) for the duration of the COVID-19 declaration under Section 564(b)(1) of the Act, 21 U.S.C.section 360bbb-3(b)(1), unless the authorization is terminated   or revoked sooner.       Influenza A by PCR NEGATIVE NEGATIVE Final   Influenza B by PCR NEGATIVE NEGATIVE Final    Comment: (NOTE) The Xpert Xpress SARS-CoV-2/FLU/RSV plus assay is intended as an aid in the diagnosis of influenza from Nasopharyngeal swab specimens and should not be used as a sole basis for treatment. Nasal washings and aspirates are unacceptable for Xpert Xpress SARS-CoV-2/FLU/RSV testing.  Fact Sheet for Patients: EntrepreneurPulse.com.au  Fact Sheet for Healthcare Providers: IncredibleEmployment.be  This test is not yet approved or cleared by the [place] FDA and has been authorized for detection and/or diagnosis of SARS-CoV-2 by FDA under an Emergency Use Authorization (EUA). This EUA will remain in effect (meaning this test can be used) for the duration of the COVID-19 declaration under Section 564(b)(1) of the Act, 21 U.S.C. section 360bbb-3(b)(1), unless the authorization is terminated or revoked.  Performed at [facility], [address], [place], [place] [address]   CSF culture w Stat Gram Stain     Status: None   Collection Time: 04/13/21 10:15 AM   Specimen: CSF; Cerebrospinal Fluid  Result Value Ref Range Status   Specimen Description CSF  Final   Special Requests NONE  Final   Gram Stain   Final    CYTOSPIN SMEAR WBC PRESENT,BOTH PMN AND MONONUCLEAR NO ORGANISMS SEEN    Culture   Final    NO GROWTH 3 DAYS Performed at [facility], [place] [address], [place], [place] [address]    Report Status 04/16/2021 FINAL  Final     Medications:    amLODipine  5 mg Oral Daily   capsaicin   Topical BID   DULoxetine  30 mg Oral Daily   [START ON 04/19/2021] DULoxetine  60 mg Oral Daily   insulin aspart  0-5 Units Subcutaneous QHS   insulin aspart  0-6 Units Subcutaneous TID WC   insulin aspart  2 Units Subcutaneous TID WC   insulin detemir  6 Units Subcutaneous BID   levothyroxine  100 mcg Oral QAC  breakfast   metoprolol succinate  50 mg Oral BID   Continuous Infusions:  sodium chloride      LOS: 6 days   Domenic Polite  [facility]  04/17/2021, 1:40 PM

## 2021-04-17 NOTE — Progress Notes (Signed)
Neurology Progress Note  S: States she is more alert this a.m. She says she moved her legs on her own. One episode of nausea last p.m. No dysphagia, chest pain or SOB. Her legs are still a little sore in top of feet. She  is to leave for SNF probably Monday.  O: Current vital signs: BP (!) 117/45 (BP Location: Right Arm)   Pulse 72   Temp 98.1 F (36.7 C) (Oral)   Resp 18   Ht 5\' 4"  (1.626 m)   Wt 68.6 kg   SpO2 98%   BMI 25.96 kg/m  Vital signs in last 24 hours: Temp:  [97.8 F (36.6 C)-98.2 F (36.8 C)] 98.1 F (36.7 C) (10/15 0837) Pulse Rate:  [71-77] 72 (10/15 0837) Resp:  [17-18] 18 (10/15 0356) BP: (107-129)/(45-52) 117/45 (10/15 0837) SpO2:  [96 %-98 %] 98 % (10/15 0837)  GENERAL: Well appearing and Awake, alert in NAD. HEENT: Normocephalic and atraumatic. LUNGS: Normal respiratory effort.  CV: RRR on tele.  Ext: warm. Dry.   NEURO:  Mental Status: Alert , oriented x 4. More interactive today. Follows commands.   Speech/Language: speech is without aphasia or dysarthria.  Naming, repetition, fluency, and comprehension intact.  Cranial Nerves:  PERRLA, tracks examiner. Eyelids elevated symmetrically. Smile is symmetric. Hearing in tactic to louder voice. I Motor:  RUE: grip 5      bicep 4+      tricep 4+ RLE: thigh 4   knee  4  plantar flexion 3  dorsiflexion 3 LUE: grip 5    bicep 4+   tricep 4+ LLE: thigh 4    knee 4   plantar flexion  3   dorsiflexion 3 Tone: is normal and bulk is normal. Sensation- Intact to light touch bilaterally.Symmetrically to light touch from thigh to feet today.  Gait- deferred.  Medications  Current Facility-Administered Medications:    0.9 %  sodium chloride infusion, , Intravenous, Continuous, Domenic Polite, MD   acetaminophen (TYLENOL) tablet 650 mg, 650 mg, Oral, Q6H PRN **OR** acetaminophen (TYLENOL) suppository 650 mg, 650 mg, Rectal, Q6H PRN, Howerter, Justin B, DO   amLODipine (NORVASC) tablet 5 mg, 5 mg, Oral, Daily,  Howerter, Justin B, DO, 5 mg at 04/15/21 0929   capsaicin (ZOSTRIX) 0.025 % cream, , Topical, BID, Domenic Polite, MD, Given at 04/16/21 2236   DULoxetine (CYMBALTA) DR capsule 30 mg, 30 mg, Oral, Daily, Kirby-Graham, Karsten Fells, NP, 30 mg at 04/16/21 1331   [START ON 04/19/2021] DULoxetine (CYMBALTA) DR capsule 60 mg, 60 mg, Oral, Daily, Kirby-Graham, Karsten Fells, NP   food thickener (SIMPLYTHICK (NECTAR/LEVEL 2/MILDLY THICK)) 1 packet, 1 packet, Oral, PRN, Charlynne Cousins, MD   insulin aspart (novoLOG) injection 0-5 Units, 0-5 Units, Subcutaneous, QHS, Charlynne Cousins, MD, 4 Units at 04/16/21 2234   insulin aspart (novoLOG) injection 0-6 Units, 0-6 Units, Subcutaneous, TID WC, Charlynne Cousins, MD, 3 Units at 04/17/21 0701   insulin aspart (novoLOG) injection 2 Units, 2 Units, Subcutaneous, TID WC, Charlynne Cousins, MD, 2 Units at 04/17/21 0702   insulin detemir (LEVEMIR) injection 6 Units, 6 Units, Subcutaneous, BID, Domenic Polite, MD   levothyroxine (SYNTHROID) tablet 100 mcg, 100 mcg, Oral, QAC breakfast, Domenic Polite, MD, 100 mcg at 04/17/21 0612   metoprolol succinate (TOPROL-XL) 24 hr tablet 50 mg, 50 mg, Oral, BID, Howerter, Justin B, DO, 50 mg at 04/16/21 2236   ondansetron (ZOFRAN) injection 4 mg, 4 mg, Intravenous, Q6H PRN, Shela Leff, MD, 4  mg at 04/16/21 2237   polyvinyl alcohol (LIQUIFILM TEARS) 1.4 % ophthalmic solution 1 drop, 1 drop, Both Eyes, PRN, Domenic Polite, MD   sodium zirconium cyclosilicate (LOKELMA) packet 10 g, 10 g, Oral, Once, Domenic Polite, MD  No new Pertinent Labs  No new Imaging  Assessment: 87 yo with progressive weakness and worsening gait x 4-6 weeks, with rapid worsening x one week prior to admission. Strength exam dorsi and plantar function are limited by pain. She has improved since yesterday which likely a combination of therapy and d/c of Neurontin. Presentation is felt to be most consistent with length dependent neuropathy  weakness due to pain secondary by elevated A1c. Her sensation has improved today. However, her neoplastic panel has not returned, but doubt this is the etiology.   Recommendations/Plan:  -continue tight sugar control.  -continue Duloxetine per order.  -continue PT/OT.  -Agree with SNF rehab.  -Continue capsaicn cream to LEs.   Clance Boll, MSN, APN-BC Neurology Nurse Practitioner Pager [phone]

## 2021-04-18 DIAGNOSIS — R29898 Other symptoms and signs involving the musculoskeletal system: Secondary | ICD-10-CM | POA: Diagnosis not present

## 2021-04-18 LAB — GLUCOSE, CAPILLARY
Glucose-Capillary: 158 mg/dL — ABNORMAL HIGH (ref 70–99)
Glucose-Capillary: 213 mg/dL — ABNORMAL HIGH (ref 70–99)
Glucose-Capillary: 208 mg/dL — ABNORMAL HIGH (ref 70–99)
Glucose-Capillary: 189 mg/dL — ABNORMAL HIGH (ref 70–99)
Glucose-Capillary: 156 mg/dL — ABNORMAL HIGH (ref 70–99)

## 2021-04-18 LAB — URINALYSIS, ROUTINE W REFLEX MICROSCOPIC
Bacteria, UA: NONE SEEN
Bilirubin Urine: NEGATIVE
Glucose, UA: NEGATIVE mg/dL
Ketones, ur: NEGATIVE mg/dL
Nitrite: NEGATIVE
Protein, ur: 100 mg/dL — AB
Specific Gravity, Urine: 1.014 (ref 1.005–1.030)
WBC, UA: >50 WBC/hpf — ABNORMAL HIGH (ref 0–5)
pH: 7 (ref 5.0–8.0)

## 2021-04-18 LAB — BASIC METABOLIC PANEL
Anion gap: 4 — ABNORMAL LOW (ref 5–15)
BUN: 49 mg/dL — ABNORMAL HIGH (ref 8–23)
CO2: 20 mmol/L — ABNORMAL LOW (ref 22–32)
Calcium: 8.2 mg/dL — ABNORMAL LOW (ref 8.9–10.3)
Chloride: 114 mmol/L — ABNORMAL HIGH (ref 98–111)
Creatinine, Ser: 1.95 mg/dL — ABNORMAL HIGH (ref 0.44–1.00)
GFR, Estimated: 24 mL/min — ABNORMAL LOW (ref >60)
Glucose, Bld: 196 mg/dL — ABNORMAL HIGH (ref 70–99)
Potassium: 5.3 mmol/L — ABNORMAL HIGH (ref 3.5–5.1)
Sodium: 138 mmol/L (ref 135–145)

## 2021-04-18 LAB — CBC
HCT: 32.5 % — ABNORMAL LOW (ref 36.0–46.0)
Hemoglobin: 10.1 g/dL — ABNORMAL LOW (ref 12.0–15.0)
MCH: 30 pg (ref 26.0–34.0)
MCHC: 31.1 g/dL (ref 30.0–36.0)
MCV: 96.4 fL (ref 80.0–100.0)
Platelets: 184 10*3/uL (ref 150–400)
RBC: 3.37 MIL/uL — ABNORMAL LOW (ref 3.87–5.11)
RDW: 13.2 % (ref 11.5–15.5)
WBC: 6.2 10*3/uL (ref 4.0–10.5)
nRBC: 0 % (ref 0.0–0.2)

## 2021-04-18 MED ORDER — SODIUM ZIRCONIUM CYCLOSILICATE 5 G PO PACK
5.0000 g | PACK | Freq: Once | ORAL | Status: AC
  Administered 2021-04-18: 5 g via ORAL
  Filled 2021-04-18: qty 1

## 2021-04-18 MED ORDER — METOPROLOL SUCCINATE ER 25 MG PO TB24: 25.0000 mg | ORAL_TABLET | Freq: Two times a day (BID) | ORAL | Status: DC

## 2021-04-18 MED ORDER — METOPROLOL TARTRATE 25 MG PO TABS
25.0000 mg | ORAL_TABLET | Freq: Two times a day (BID) | ORAL | Status: DC
  Administered 2021-04-18 – 2021-04-20 (×5): 25 mg via ORAL
  Filled 2021-04-18 (×5): qty 1

## 2021-04-18 NOTE — TOC Progression Note (Addendum)
Transition of Care [facility]) - Progression Note    Patient Details  Name: THORA SCHERMAN MRN: [id number] Date of Birth: 12/29/1927  Transition of Care [facility]) CM/SW [place], [place] Phone Number: 04/18/2021, 10:38 AM  Clinical Narrative:    CSW spoke with pt's grandson Saralyn Pilar, as CIR has signed off and pt will need SNF placement. CSW provided bed offers via Email at [place] request. He will follow up with bed choice. TOC will continue to follow.  Saralyn Pilar requested bed offers be faxed out into [place], [place] sent out additional bed offers. [facility]) TOC will provide additional bed offers when available.       Expected Discharge Plan and Services                                                 Social Determinants of Health (SDOH) Interventions    Readmission Risk Interventions No flowsheet data found.

## 2021-04-18 NOTE — Progress Notes (Signed)
[facility] PROGRESS NOTE    Progress Note  Catherine Mason  AJO:[id number] DOB: 12-Mar-1928 DOA: 04/10/2021 PCP: Reynold Bowen, MD   Brief Narrative:   Catherine Mason is an 87 y.o. female past medical history significant for diabetes mellitus type 2 chronic kidney disease stage IIIb creatinine 1.6-1.8, CAD, Uterine cancer, essential hypertension chronic diastolic heart failure was transferred to [facility] from [facility] ED . -She presented with acute onset bilateral leg weakness X 1 day, she was ambulatory without issues the day prior to her symptoms  -MRI brain and entire spine was negative for acute findings -  Significant studies: MRI of the brain showed no stroke. MRI of the lumbar spine shows severe osteoarthritis.  With mild neuroforaminal stenosis at L1-L2 L2-L3 and L4-L5 MRI of the cervical C-spine showed multiple spinal level canal stenosis most notably and C5-C6 MRI of the thoracic spine showed no myelopathy or spinal cord dysfunction no evidence of impingement.  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 -MRI brain, cervical thoracic and lumbar spine were unrevealing, degenerative changes noted -CT abdomen pelvis without evidence of malignancy,  -LP was completed 10/11, CSF with increase RBCs, Bloody tap, protein is normal -CK was 36, electrolytes were unremarkable  -Started on low-dose gabapentin however had resultant lethargy worsened by AKI and decreased clearance, now discontinued, started on low-dose Cymbalta by neurology, also continue capsaicin cream -B12 within normal limits -PT OT following, now plan for SNF for rehab  AKI on chronic kidney disease stage IIIb: Hyperkalemia -baseline on admission 1.6-1.8 -Creatinine trended to 2.2,, improving to 1.9 today, repeat Lokelma -Obtain bladder scan multiple locations, no retention noted  Diabetes mellitus type II uncontrolled: -CBGs elevated, continue  Levemir and sliding scale, increased dose  Essential  HTN (hypertension) -Continue amlodipine and Toprol  Hypothyroidism -TSH is high, increased Synthroid dose  Chronic diastolic heart failure: -Clinically appears euvolemic  DVT prophylaxis: lovenox  Code status: Full Code Family Communication: Discussed with stepson at bedside Status is: Inpatient  Remains inpatient appropriate due to severity of illness  Dispo: The patient is from: Home              Anticipated d/c is to: SNF early next week if stable              Patient currently is not medically stable to d/c.   Difficult to place patient No     Procedures and diagnostic studies:  LP 10/11   Medical Consultants:   Neurology   Subjective:    Catherine Mason doing a little better, pain improving, still has bilateral leg weakness  Objective:    Vitals:   04/18/21 0500 04/18/21 0716 04/18/21 0726 04/18/21 0851  BP:  (!) 111/48 (!) 116/51 (!) 110/49  Pulse:  64 (!) 59 64  Resp:  18 16 18   Temp:  98 F (36.7 C) 97.8 F (36.6 C)   TempSrc:   Oral   SpO2:  95% 95% 97%  Weight: 68.5 kg     Height:       SpO2: 97 %   Intake/Output Summary (Last 24 hours) at 04/18/2021 1131 Last data filed at 04/18/2021 0500 Gross per 24 hour  Intake --  Output 300 ml  Net -300 ml   Filed Weights   04/13/21 0415 04/16/21 0619 04/18/21 0500  Weight: 66.8 kg 68.6 kg 68.5 kg    Exam: Gen: Pleasant elderly female sitting up in bed, awake alert, resting comfortably,  no distress HEENT: No JVD CVS: S1-S2, regular rate rhythm Lungs: Clear bilaterally Abdomen: Soft, nontender, bowel sounds present  Extremities: No edema  skin: no new rashes on exposed skin  Neuro: mild lower leg weakness, decreased pin prick and paresthesias  Data Reviewed:    Labs: Basic Metabolic Panel: Recent Labs  Lab 04/13/21 0305 04/15/21 0353 04/16/21 0918 04/17/21 0428 04/18/21 0415  NA 135 135 136 137 138  K 4.4 5.1 5.3* 5.6*  5.3*  CL 110 109 110 113* 114*  CO2 18* 19* 19* 18* 20*  GLUCOSE 175* 212* 202* 291* 196*  BUN 30* 42* 52* 53* 49*  CREATININE 1.79* 1.93* 2.22* 2.10* 1.95*  CALCIUM 8.3* 8.6* 8.5* 8.2* 8.2*   GFR Estimated Creatinine Clearance: 17.9 mL/min (A) (by C-G formula based on SCr of 1.95 mg/dL (H)). Liver Function Tests: No results for input(s): AST, ALT, ALKPHOS, BILITOT, PROT, ALBUMIN in the last 168 hours.  No results for input(s): LIPASE, AMYLASE in the last 168 hours. No results for input(s): AMMONIA in the last 168 hours. Coagulation profile No results for input(s): INR, PROTIME in the last 168 hours.  COVID-19 Labs  No results for input(s): DDIMER, FERRITIN, LDH, CRP in the last 72 hours.   Lab Results  Component Value Date   [place] NEGATIVE 04/10/2021    CBC: Recent Labs  Lab 04/15/21 0353 04/17/21 0428 04/18/21 0415  WBC 6.9 5.0 6.2  HGB 11.2* 10.3* 10.1*  HCT 35.1* 33.0* 32.5*  MCV 94.4 97.9 96.4  PLT 154 170 184   Cardiac Enzymes: Recent Labs  Lab 04/11/21 1651  CKTOTAL 36*   BNP (last 3 results) No results for input(s): PROBNP in the last 8760 hours. CBG: Recent Labs  Lab 04/17/21 0621 04/17/21 1113 04/17/21 1706 04/17/21 2124 04/18/21 0652  GLUCAP 259* 249* 209* 182* 158*   D-Dimer: No results for input(s): DDIMER in the last 72 hours. Hgb A1c: No results for input(s): HGBA1C in the last 72 hours.  Lipid Profile: No results for input(s): CHOL, HDL, LDLCALC, TRIG, CHOLHDL, LDLDIRECT in the last 72 hours. Thyroid function studies: No results for input(s): TSH, T4TOTAL, T3FREE, THYROIDAB in the last 72 hours.  Invalid input(s): FREET3  Anemia work up: No results for input(s): VITAMINB12, FOLATE, FERRITIN, TIBC, IRON, RETICCTPCT in the last 72 hours.  Sepsis Labs: Recent Labs  Lab 04/15/21 0353 04/17/21 0428 04/18/21 0415  WBC 6.9 5.0 6.2   Microbiology Recent Results (from the past 240 hour(s))  Resp Panel by RT-PCR (Flu A&B,  Covid) Nasopharyngeal Swab     Status: None   Collection Time: 04/10/21  2:54 PM   Specimen: Nasopharyngeal Swab; Nasopharyngeal(NP) swabs in vial transport medium  Result Value Ref Range Status   SARS Coronavirus 2 by RT PCR NEGATIVE NEGATIVE Final    Comment: (NOTE) SARS-CoV-2 target nucleic acids are NOT DETECTED.  The SARS-CoV-2 RNA is generally detectable in upper respiratory specimens during the acute phase of infection. The lowest concentration of SARS-CoV-2 viral copies this assay can detect is 138 copies/mL. A negative result does not preclude SARS-Cov-2 infection and should not be used as the sole basis for treatment or other patient management decisions. A negative result may occur with  improper specimen collection/handling, submission of specimen other than nasopharyngeal swab, presence of viral mutation(s) within the areas targeted by this assay, and inadequate number of viral copies(<138 copies/mL). A negative result must be combined with clinical observations, patient history, and epidemiological information. The expected result is Negative.  Fact Sheet for Patients:  EntrepreneurPulse.com.au  Fact Sheet for Healthcare Providers:  IncredibleEmployment.be  This test is no t yet approved or cleared by the [place] FDA and  has been authorized for detection and/or diagnosis of SARS-CoV-2 by FDA under an Emergency Use Authorization (EUA). This EUA will remain  in effect (meaning this test can be used) for the duration of the COVID-19 declaration under Section 564(b)(1) of the Act, 21 U.S.C.section 360bbb-3(b)(1), unless the authorization is terminated  or revoked sooner.       Influenza A by PCR NEGATIVE NEGATIVE Final   Influenza B by PCR NEGATIVE NEGATIVE Final    Comment: (NOTE) The Xpert Xpress SARS-CoV-2/FLU/RSV plus assay is intended as an aid in the diagnosis of influenza from Nasopharyngeal swab specimens and should  not be used as a sole basis for treatment. Nasal washings and aspirates are unacceptable for Xpert Xpress SARS-CoV-2/FLU/RSV testing.  Fact Sheet for Patients: EntrepreneurPulse.com.au  Fact Sheet for Healthcare Providers: IncredibleEmployment.be  This test is not yet approved or cleared by the [place] FDA and has been authorized for detection and/or diagnosis of SARS-CoV-2 by FDA under an Emergency Use Authorization (EUA). This EUA will remain in effect (meaning this test can be used) for the duration of the COVID-19 declaration under Section 564(b)(1) of the Act, 21 U.S.C. section 360bbb-3(b)(1), unless the authorization is terminated or revoked.  Performed at [facility], [address], [place], [place] [address]   CSF culture w Stat Gram Stain     Status: None   Collection Time: 04/13/21 10:15 AM   Specimen: CSF; Cerebrospinal Fluid  Result Value Ref Range Status   Specimen Description CSF  Final   Special Requests NONE  Final   Gram Stain   Final    CYTOSPIN SMEAR WBC PRESENT,BOTH PMN AND MONONUCLEAR NO ORGANISMS SEEN    Culture   Final    NO GROWTH 3 DAYS Performed at [facility], [place] [address] [place], [place] [address]    Report Status 04/16/2021 FINAL  Final     Medications:    capsaicin   Topical BID   [START ON 04/19/2021] DULoxetine  60 mg Oral Daily   insulin aspart  0-5 Units Subcutaneous QHS   insulin aspart  0-6 Units Subcutaneous TID WC   insulin aspart  2 Units Subcutaneous TID WC   insulin detemir  6 Units Subcutaneous BID   levothyroxine  100 mcg Oral QAC breakfast   metoprolol tartrate  25 mg Oral BID   Continuous Infusions:    LOS: 7 days   Domenic Polite  [facility]  04/18/2021, 11:31 AM

## 2021-04-18 NOTE — Plan of Care (Signed)
  Problem: Education: Goal: Knowledge of General Education information will improve Description: Including pain rating scale, medication(s)/side effects and non-pharmacologic comfort measures Outcome: Progressing   Problem: Clinical Measurements: Goal: Ability to maintain clinical measurements within normal limits will improve Outcome: Progressing Goal: Will remain free from infection Outcome: Progressing Goal: Diagnostic test results will improve Outcome: Progressing Goal: Respiratory complications will improve Outcome: Progressing Goal: Cardiovascular complication will be avoided Outcome: Progressing   Problem: Nutrition: Goal: Adequate nutrition will be maintained Outcome: Progressing   Problem: Coping: Goal: Level of anxiety will decrease Outcome: Progressing   Problem: Skin Integrity: Goal: Risk for impaired skin integrity will decrease Outcome: Progressing   Problem: Education: Goal: Knowledge of disease or condition will improve Outcome: Progressing Goal: Knowledge of secondary prevention will improve Outcome: Progressing Goal: Knowledge of patient specific risk factors addressed and post discharge goals established will improve Outcome: Progressing Goal: Individualized Educational Video(s) Outcome: Progressing

## 2021-04-19 DIAGNOSIS — R29898 Other symptoms and signs involving the musculoskeletal system: Secondary | ICD-10-CM | POA: Diagnosis not present

## 2021-04-19 LAB — BASIC METABOLIC PANEL
Anion gap: 7 (ref 5–15)
BUN: 46 mg/dL — ABNORMAL HIGH (ref 8–23)
CO2: 18 mmol/L — ABNORMAL LOW (ref 22–32)
Calcium: 8.5 mg/dL — ABNORMAL LOW (ref 8.9–10.3)
Chloride: 113 mmol/L — ABNORMAL HIGH (ref 98–111)
Creatinine, Ser: 1.63 mg/dL — ABNORMAL HIGH (ref 0.44–1.00)
GFR, Estimated: 30 mL/min — ABNORMAL LOW (ref >60)
Glucose, Bld: 150 mg/dL — ABNORMAL HIGH (ref 70–99)
Potassium: 5.1 mmol/L (ref 3.5–5.1)
Sodium: 138 mmol/L (ref 135–145)

## 2021-04-19 LAB — CBC
HCT: 33.6 % — ABNORMAL LOW (ref 36.0–46.0)
Hemoglobin: 10.7 g/dL — ABNORMAL LOW (ref 12.0–15.0)
MCH: 30.6 pg (ref 26.0–34.0)
MCHC: 31.8 g/dL (ref 30.0–36.0)
MCV: 96 fL (ref 80.0–100.0)
Platelets: 183 10*3/uL (ref 150–400)
RBC: 3.5 MIL/uL — ABNORMAL LOW (ref 3.87–5.11)
RDW: 13.1 % (ref 11.5–15.5)
WBC: 5.7 10*3/uL (ref 4.0–10.5)
nRBC: 0 % (ref 0.0–0.2)

## 2021-04-19 LAB — GLUCOSE, CAPILLARY
Glucose-Capillary: 149 mg/dL — ABNORMAL HIGH (ref 70–99)
Glucose-Capillary: 195 mg/dL — ABNORMAL HIGH (ref 70–99)
Glucose-Capillary: 167 mg/dL — ABNORMAL HIGH (ref 70–99)
Glucose-Capillary: 141 mg/dL — ABNORMAL HIGH (ref 70–99)

## 2021-04-19 MED ORDER — POLYETHYLENE GLYCOL 3350 17 G PO PACK
17.0000 g | PACK | Freq: Every day | ORAL | Status: DC
  Administered 2021-04-19 – 2021-04-20 (×2): 17 g via ORAL
  Filled 2021-04-19 (×2): qty 1

## 2021-04-19 MED ORDER — DULOXETINE HCL 30 MG PO CPEP
30.0000 mg | ORAL_CAPSULE | Freq: Every day | ORAL | Status: DC
  Administered 2021-04-19 – 2021-04-20 (×2): 30 mg via ORAL
  Filled 2021-04-19 (×2): qty 1

## 2021-04-19 MED ORDER — SODIUM CHLORIDE 0.9 % IV SOLN
1.0000 g | INTRAVENOUS | Status: DC
  Administered 2021-04-19: 1 g via INTRAVENOUS
  Filled 2021-04-19: qty 10

## 2021-04-19 NOTE — Plan of Care (Signed)
  Problem: Clinical Measurements: Goal: Ability to maintain clinical measurements within normal limits will improve Outcome: Progressing Goal: Will remain free from infection Outcome: Progressing Goal: Diagnostic test results will improve Outcome: Progressing Goal: Respiratory complications will improve Outcome: Progressing Goal: Cardiovascular complication will be avoided Outcome: Progressing   Problem: Education: Goal: Knowledge of disease or condition will improve Outcome: Progressing Goal: Knowledge of secondary prevention will improve Outcome: Progressing Goal: Knowledge of patient specific risk factors addressed and post discharge goals established will improve Outcome: Progressing Goal: Individualized Educational Video(s) Outcome: Progressing

## 2021-04-19 NOTE — TOC Progression Note (Signed)
Transition of Care [facility]) - Progression Note    Patient Details  Name: Catherine Mason MRN: [id number] Date of Birth: 09/07/1927  [facility] Care [facility]) CM/SW [place], [place] Phone Number: 04/19/2021, 2:51 PM  Clinical Narrative:   CSW spoke with grandson Saralyn Pilar to update that [facility] could offer a bed. Lengthy discussion with Saralyn Pilar on expectations regarding transfer to SNF as well as consideration for possible long term placement in the future, if needed. Saralyn Pilar appreciative of information. CSW sent MD a message that grandson had some medical questions if she is able to call and answer today. CSW to send in for insurance authorization once therapy updates are in the chart. CSW to follow.    Expected Discharge Plan: [place] Barriers to Discharge: Continued Medical Work up, Ship broker  Expected Discharge Plan and Services Expected Discharge Plan: [place] Choice: [place] arrangements for the past 2 months: Single Family Home                                       Social Determinants of Health (SDOH) Interventions    Readmission Risk Interventions No flowsheet data found.

## 2021-04-19 NOTE — Progress Notes (Addendum)
[facility] PROGRESS NOTE    Progress Note  [name]  IDP:[id number] DOB: March 01, 1928 DOA: 04/10/2021 PCP: Reynold Bowen, MD   Brief Narrative:   Catherine Mason is an 87 y.o. female past medical history significant for diabetes mellitus type 2 chronic kidney disease stage IIIb creatinine 1.6-1.8, CAD, Uterine cancer, essential hypertension chronic diastolic heart failure was transferred to [facility] from [facility] ED . -She presented with acute onset bilateral leg weakness X 1 day, she was ambulatory without issues the day prior to her symptoms  -MRI brain and entire spine was negative for acute findings [facility] course complicated by lethargy from gabapentin, acute kidney injury, now improving  Significant studies: MRI of the brain showed no stroke. MRI of the lumbar spine shows severe osteoarthritis.  With mild neuroforaminal stenosis at L1-L2 L2-L3 and L4-L5 MRI of the cervical C-spine showed multiple spinal level canal stenosis most notably and C5-C6 MRI of the thoracic spine showed no myelopathy or spinal cord dysfunction no evidence of impingement.  LP 10/11  Assessment/Plan:   Subacute lower extremity weakness, pain -Secondary to diabetic polyneuropathy, followed by neurology -MRI brain, cervical thoracic and lumbar spine were unrevealing, degenerative changes noted -CT abdomen pelvis without evidence of malignancy,  -LP was completed 10/11, CSF with increase RBCs, Bloody tap, protein is normal -CK was 36, electrolytes were unremarkable  -Started on low-dose gabapentin however had resultant lethargy worsened by AKI and decreased clearance, now discontinued, started on low-dose Cymbalta by neurology, also continue capsaicin cream -B12 within normal limits -Continue physical therapy, PT recommended SNF -Mental status more stable now -Discharge planning -Neurology follow-up in 4 weeks  AKI on chronic kidney disease stage IIIb: Hyperkalemia -baseline on admission  1.6-1.8 -Creatinine trended to 2.2,, improving now down to 1.6 today -Bladder scan was negative for retention   Diabetes mellitus type II uncontrolled: -CBGs elevated, continue Levemir and sliding scale, increased dose  Essential  HTN (hypertension) -Continue amlodipine and Toprol  Hypothyroidism -TSH is high, increased Synthroid dose  Chronic diastolic heart failure: -Clinically appears euvolemic  Abnormal UA -increased WBC and bacteria, will add ceftriaxone, FU urine cs  DVT prophylaxis: lovenox  Code status: Full Code Family Communication: No family at bedside, discussed with stepson yesterday  status is: Inpatient  Remains inpatient appropriate due to severity of illness  Dispo: The patient is from: Home              Anticipated d/c is to: SNF tomorrow if stable              Patient currently is not medically stable to d/c.   Difficult to place patient No     Procedures and diagnostic studies:  LP 10/11   Medical Consultants:   Neurology   Subjective:    No events overnight reported by staff, reports feeling a little tired but okay otherwise, ate breakfast this morning, denies any shortness of breath, reports lower extremity weakness  Objective:    Vitals:   04/18/21 2354 04/19/21 0324 04/19/21 0346 04/19/21 1208  BP: 131/63 133/63  (!) 144/78  Pulse: 66 65  72  Resp: 17 17    Temp: 98.5 F (36.9 C) 98.2 F (36.8 C)  98 F (36.7 C)  TempSrc: Oral Oral    SpO2: 97% 96%  100%  Weight:   65.6 kg   Height:       SpO2: 100 %  No intake or output data in the 24 hours ending 04/19/21 [place]  04/16/21 0619 04/18/21 0500 04/19/21 0346  Weight: 68.6 kg 68.5 kg 65.6 kg    Exam Gen: Pleasant elderly female sitting up in bed, somnolent but easily arousable, oriented x2, resting comfortably, no distress HEENT: No JVD CVS: S1-S2, regular rate rhythm Lungs: Decreased breath sounds to bases Abdomen: Soft, nontender, bowel sounds  present Extremities: No edema  skin: no new rashes on exposed skin  Neuro: mild lower leg weakness, decreased pin prick and paresthesias  Data Reviewed:    Labs: Basic Metabolic Panel: Recent Labs  Lab 04/15/21 0353 04/16/21 0918 04/17/21 0428 04/18/21 0415 04/19/21 0317  NA 135 136 137 138 138  K 5.1 5.3* 5.6* 5.3* 5.1  CL 109 110 113* 114* 113*  CO2 19* 19* 18* 20* 18*  GLUCOSE 212* 202* 291* 196* 150*  BUN 42* 52* 53* 49* 46*  CREATININE 1.93* 2.22* 2.10* 1.95* 1.63*  CALCIUM 8.6* 8.5* 8.2* 8.2* 8.5*   GFR Estimated Creatinine Clearance: 19.4 mL/min (A) (by C-G formula based on SCr of 1.63 mg/dL (H)). Liver Function Tests: No results for input(s): AST, ALT, ALKPHOS, BILITOT, PROT, ALBUMIN in the last 168 hours.  No results for input(s): LIPASE, AMYLASE in the last 168 hours. No results for input(s): AMMONIA in the last 168 hours. Coagulation profile No results for input(s): INR, PROTIME in the last 168 hours.  COVID-19 Labs  No results for input(s): DDIMER, FERRITIN, LDH, CRP in the last 72 hours.   Lab Results  Component Value Date   [place] NEGATIVE 04/10/2021    CBC: Recent Labs  Lab 04/15/21 0353 04/17/21 0428 04/18/21 0415 04/19/21 0317  WBC 6.9 5.0 6.2 5.7  HGB 11.2* 10.3* 10.1* 10.7*  HCT 35.1* 33.0* 32.5* 33.6*  MCV 94.4 97.9 96.4 96.0  PLT 154 170 184 183   Cardiac Enzymes: No results for input(s): CKTOTAL, CKMB, CKMBINDEX, TROPONINI in the last 168 hours.  BNP (last 3 results) No results for input(s): PROBNP in the last 8760 hours. CBG: Recent Labs  Lab 04/18/21 1550 04/18/21 1706 04/18/21 2126 04/19/21 0603 04/19/21 1206  GLUCAP 208* 189* 156* 149* 195*   D-Dimer: No results for input(s): DDIMER in the last 72 hours. Hgb A1c: No results for input(s): HGBA1C in the last 72 hours.  Lipid Profile: No results for input(s): CHOL, HDL, LDLCALC, TRIG, CHOLHDL, LDLDIRECT in the last 72 hours. Thyroid function studies: No  results for input(s): TSH, T4TOTAL, T3FREE, THYROIDAB in the last 72 hours.  Invalid input(s): FREET3  Anemia work up: No results for input(s): VITAMINB12, FOLATE, FERRITIN, TIBC, IRON, RETICCTPCT in the last 72 hours.  Sepsis Labs: Recent Labs  Lab 04/15/21 0353 04/17/21 0428 04/18/21 0415 04/19/21 0317  WBC 6.9 5.0 6.2 5.7   Microbiology Recent Results (from the past 240 hour(s))  Resp Panel by RT-PCR (Flu A&B, Covid) Nasopharyngeal Swab     Status: None   Collection Time: 04/10/21  2:54 PM   Specimen: Nasopharyngeal Swab; Nasopharyngeal(NP) swabs in vial transport medium  Result Value Ref Range Status   SARS Coronavirus 2 by RT PCR NEGATIVE NEGATIVE Final    Comment: (NOTE) SARS-CoV-2 target nucleic acids are NOT DETECTED.  The SARS-CoV-2 RNA is generally detectable in upper respiratory specimens during the acute phase of infection. The lowest concentration of SARS-CoV-2 viral copies this assay can detect is 138 copies/mL. A negative result does not preclude SARS-Cov-2 infection and should not be used as the sole basis for treatment or other patient management decisions. A negative result may occur with  improper specimen collection/handling, submission of specimen other than nasopharyngeal swab, presence of viral mutation(s) within the areas targeted by this assay, and inadequate number of viral copies(<138 copies/mL). A negative result must be combined with clinical observations, patient history, and epidemiological information. The expected result is Negative.  Fact Sheet for Patients:  EntrepreneurPulse.com.au  Fact Sheet for Healthcare Providers:  IncredibleEmployment.be  This test is no t yet approved or cleared by the [place] FDA and  has been authorized for detection and/or diagnosis of SARS-CoV-2 by FDA under an Emergency Use Authorization (EUA). This EUA will remain  in effect (meaning this test can be used) for the  duration of the COVID-19 declaration under Section 564(b)(1) of the Act, 21 U.S.C.section 360bbb-3(b)(1), unless the authorization is terminated  or revoked sooner.       Influenza A by PCR NEGATIVE NEGATIVE Final   Influenza B by PCR NEGATIVE NEGATIVE Final    Comment: (NOTE) The Xpert Xpress SARS-CoV-2/FLU/RSV plus assay is intended as an aid in the diagnosis of influenza from Nasopharyngeal swab specimens and should not be used as a sole basis for treatment. Nasal washings and aspirates are unacceptable for Xpert Xpress SARS-CoV-2/FLU/RSV testing.  Fact Sheet for Patients: EntrepreneurPulse.com.au  Fact Sheet for Healthcare Providers: IncredibleEmployment.be  This test is not yet approved or cleared by the [place] FDA and has been authorized for detection and/or diagnosis of SARS-CoV-2 by FDA under an Emergency Use Authorization (EUA). This EUA will remain in effect (meaning this test can be used) for the duration of the COVID-19 declaration under Section 564(b)(1) of the Act, 21 U.S.C. section 360bbb-3(b)(1), unless the authorization is terminated or revoked.  Performed at [facility], [address] [place], [place] [address]   CSF culture w Stat Gram Stain     Status: None   Collection Time: 04/13/21 10:15 AM   Specimen: CSF; Cerebrospinal Fluid  Result Value Ref Range Status   Specimen Description CSF  Final   Special Requests NONE  Final   Gram Stain   Final    CYTOSPIN SMEAR WBC PRESENT,BOTH PMN AND MONONUCLEAR NO ORGANISMS SEEN    Culture   Final    NO GROWTH 3 DAYS Performed at [facility], [place] [address], [place], [place] [address]    Report Status 04/16/2021 FINAL  Final     Medications:    capsaicin   Topical BID   DULoxetine  30 mg Oral Daily   insulin aspart  0-5 Units Subcutaneous QHS   insulin aspart  0-6 Units Subcutaneous TID WC   insulin aspart  2 Units Subcutaneous TID WC   insulin detemir   6 Units Subcutaneous BID   levothyroxine  100 mcg Oral QAC breakfast   metoprolol tartrate  25 mg Oral BID   polyethylene glycol  17 g Oral Daily   Continuous Infusions:    LOS: 8 days   Domenic Polite  [facility]  04/19/2021, 1:54 PM

## 2021-04-19 NOTE — Plan of Care (Signed)
°  Problem: Education: °Goal: Knowledge of General Education information will improve °Description: Including pain rating scale, medication(s)/side effects and non-pharmacologic comfort measures °Outcome: Progressing °  °Problem: Clinical Measurements: °Goal: Ability to maintain clinical measurements within normal limits will improve °Outcome: Progressing °Goal: Will remain free from infection °Outcome: Progressing °  °Problem: Activity: °Goal: Risk for activity intolerance will decrease °Outcome: Progressing °  °Problem: Nutrition: °Goal: Adequate nutrition will be maintained °Outcome: Progressing °  °Problem: Coping: °Goal: Level of anxiety will decrease °Outcome: Progressing °  °Problem: Pain Managment: °Goal: General experience of comfort will improve °Outcome: Progressing °  °

## 2021-04-19 NOTE — Progress Notes (Addendum)
PT Cancellation Note  Patient Details Name: Catherine Mason MRN: [id number] DOB: 29-Sep-1927   Cancelled Treatment:    Reason Eval/Treat Not Completed: (P) Other (comment) (pt sleeping and not easily awoken to voice.) Will continue efforts in afternoon per PT POC as schedule permits.   [name] 04/19/2021, 3:15 PM

## 2021-04-19 NOTE — Progress Notes (Signed)
Physical Therapy Treatment Patient Details Name: Catherine Mason MRN: [id number] DOB: 1928-01-26 Today's Date: 04/19/2021   History of Present Illness Pt is a 87 y/o female who presents with bilateral lower extremity weakness after presenting to [facility] ED via ED[facility]to-ED transfer from [facility]. Imaging revealed no abnormalities in the brain but did reveal degenerative changes to the cervical and thoracic spine. PMH significant for type 2 diabetes mellitus, stage IIIb chronic kidney disease with baseline creatinine 1.6-1.8, HTN, chronic diastolic heart failure, dysphagia.    PT Comments    Pt received in supine, sleeping but able to awaken to voice and with good participation and fair tolerance for bed mobility and transfer training. Pt with improved tolerance for functional mobility tasks this date, follows better to multimodal cues with increased time to process and demonstrates poor seated/standing balance with posterior lean throughout (worse in standing). Pt needing up to maxA for transfers and pre-gait sidesteps along EOB (unable to perform forward stepping due to unsafe posterior lean with +1 assist this date). Pt continues to benefit from PT services to progress toward functional mobility goals.    Recommendations for follow up therapy are one component of a multi-disciplinary discharge planning process, led by the attending physician.  Recommendations may be updated based on patient status, additional functional criteria and insurance authorization.  Follow Up Recommendations  CIR     Equipment Recommendations  Other (comment) (TBD by next venue of care)    Recommendations for Other Services       Precautions / Restrictions Precautions Precautions: Fall Precaution Comments: frequent B foot pain Restrictions Weight Bearing Restrictions: No     Mobility  Bed Mobility Overal bed mobility: Needs Assistance Bed Mobility: Rolling;Sidelying to Sit;Sit to Sidelying Rolling: Min  assist Sidelying to sit: Mod assist     Sit to sidelying: Mod assist General bed mobility comments: pt with poor initiation but with more tactile cues and hand over hand assist, able to follow instruction for getting to EOB. Needs transfer pad assist to scoot hips forward.    Transfers Overall transfer level: Needs assistance Equipment used: Rolling walker (2 wheeled) Transfers: Sit to/from Stand Sit to Stand: Max assist;From elevated surface         General transfer comment: from very elevated bed height<>RW x2 trials, pt with heavy posterior lean and difficulty extending hips/neck fully, stating "I can't" even with tactile cues.  Ambulation/Gait             General Gait Details: 2 shuffled steps toward R side along EOB x2 trials with +1 maxA and RW support. Pt with difficulty offloading weight for stepping, needs max to totalA for weight shift to step.   Stairs             Wheelchair Mobility    Modified Rankin (Stroke Patients Only)       Balance Overall balance assessment: Needs assistance Sitting-balance support: Feet supported;Bilateral upper extremity supported Sitting balance-Leahy Scale: Poor Sitting balance - Comments: posterior lean Postural control: Posterior lean;Left lateral lean Standing balance support: Bilateral upper extremity supported;During functional activity Standing balance-Leahy Scale: Zero Standing balance comment: maxA and heavy posterior lean at RW, needs near totalA when stepping                            Cognition Arousal/Alertness: Awake/alert (sleeping but easily awoken) Behavior During Therapy: Flat affect Overall Cognitive Status: No family/caregiver present to determine baseline cognitive functioning  General Comments: Pt with slow processing of 1-step cues, needs multimodal cues for sequencing bed mobility and increased time to perform all tasks, but more alert  today than on Friday's session. Pt oriented to location/city/situation/day of week.      Exercises Other Exercises Other Exercises: encouraged her to perform supine bilateral ankle pumps and quad sets throughout the day, pt will need reinforcement with limited carryover of instruction during session    General Comments        Pertinent Vitals/Pain Pain Assessment: Faces Faces Pain Scale: Hurts a little bit Pain Location: occasional grimacing during functional mobility tasks Pain Descriptors / Indicators: Grimacing Pain Intervention(s): Limited activity within patient's tolerance;Monitored during session;Premedicated before session;Repositioned    Home Living                      Prior Function            PT Goals (current goals can now be found in the care plan section) Acute Rehab PT Goals Patient Stated Goal: Be able to walk again PT Goal Formulation: With patient/family Time For Goal Achievement: 04/19/21 Progress towards PT goals: Progressing toward goals    Frequency    Min 3X/week      PT Plan Current plan remains appropriate    Co-evaluation              AM-PAC PT "6 Clicks" Mobility   Outcome Measure  Help needed turning from your back to your side while in a flat bed without using bedrails?: A Lot Help needed moving from lying on your back to sitting on the side of a flat bed without using bedrails?: A Lot Help needed moving to and from a bed to a chair (including a wheelchair)?: Total Help needed standing up from a chair using your arms (e.g., wheelchair or bedside chair)?: A Lot Help needed to walk in hospital room?: Total Help needed climbing 3-5 steps with a railing? : Total 6 Click Score: 9    End of Session Equipment Utilized During Treatment: Gait belt Activity Tolerance: Patient limited by fatigue Patient left: in bed;with call bell/phone within reach;with bed alarm set;with SCD's reapplied Nurse Communication: Mobility  status PT Visit Diagnosis: Unsteadiness on feet (R26.81);Muscle weakness (generalized) (M62.81);Difficulty in walking, not elsewhere classified (R26.2)     Time: 4097-3532 PT Time Calculation (min) (ACUTE ONLY): 24 min  Charges:  $Therapeutic Activity: 23-37 mins                     Ronita Hargreaves P., PTA Acute Rehabilitation Services Pager: [phone] Office: [place] 04/19/2021, 4:43 PM

## 2021-04-20 ENCOUNTER — Inpatient Hospital Stay (HOSPITAL_COMMUNITY): Payer: Medicare Other

## 2021-04-20 DIAGNOSIS — I5032 Chronic diastolic (congestive) heart failure: Secondary | ICD-10-CM | POA: Diagnosis not present

## 2021-04-20 DIAGNOSIS — R29898 Other symptoms and signs involving the musculoskeletal system: Secondary | ICD-10-CM | POA: Diagnosis not present

## 2021-04-20 DIAGNOSIS — N1832 Chronic kidney disease, stage 3b: Secondary | ICD-10-CM | POA: Diagnosis not present

## 2021-04-20 LAB — CBC
HCT: 33.7 % — ABNORMAL LOW (ref 36.0–46.0)
Hemoglobin: 10.7 g/dL — ABNORMAL LOW (ref 12.0–15.0)
MCH: 30.1 pg (ref 26.0–34.0)
MCHC: 31.8 g/dL (ref 30.0–36.0)
MCV: 94.7 fL (ref 80.0–100.0)
Platelets: 207 10*3/uL (ref 150–400)
RBC: 3.56 MIL/uL — ABNORMAL LOW (ref 3.87–5.11)
RDW: 13 % (ref 11.5–15.5)
WBC: 5.1 10*3/uL (ref 4.0–10.5)
nRBC: 0 % (ref 0.0–0.2)

## 2021-04-20 LAB — GLUCOSE, CAPILLARY
Glucose-Capillary: 137 mg/dL — ABNORMAL HIGH (ref 70–99)
Glucose-Capillary: 195 mg/dL — ABNORMAL HIGH (ref 70–99)
Glucose-Capillary: 149 mg/dL — ABNORMAL HIGH (ref 70–99)
Glucose-Capillary: 165 mg/dL — ABNORMAL HIGH (ref 70–99)

## 2021-04-20 LAB — URINE CULTURE

## 2021-04-20 LAB — RESP PANEL BY RT-PCR (FLU A&B, COVID) ARPGX2
Influenza A by PCR: NEGATIVE
Influenza B by PCR: NEGATIVE
SARS Coronavirus 2 by RT PCR: NEGATIVE

## 2021-04-20 LAB — PROCALCITONIN: Procalcitonin: <0.1 ng/mL

## 2021-04-20 MED ORDER — CEPHALEXIN 250 MG PO CAPS: 250.0000 mg | ORAL_CAPSULE | Freq: Three times a day (TID) | ORAL | 0 refills | Status: AC

## 2021-04-20 MED ORDER — METOPROLOL TARTRATE 25 MG PO TABS: 25.0000 mg | ORAL_TABLET | Freq: Two times a day (BID) | ORAL | 0 refills | Status: AC

## 2021-04-20 MED ORDER — FUROSEMIDE 40 MG PO TABS: 20.0000 mg | ORAL_TABLET | ORAL | Status: DC

## 2021-04-20 MED ORDER — SENNOSIDES-DOCUSATE SODIUM 8.6-50 MG PO TABS: 1.0000 | ORAL_TABLET | Freq: Every day | ORAL | Status: AC

## 2021-04-20 MED ORDER — LEVOTHYROXINE SODIUM 75 MCG PO TABS: 75.0000 ug | ORAL_TABLET | Freq: Every day | ORAL | Status: DC

## 2021-04-20 MED ORDER — BISACODYL 10 MG RE SUPP
10.0000 mg | Freq: Once | RECTAL | Status: AC
  Administered 2021-04-20: 10 mg via RECTAL
  Filled 2021-04-20: qty 1

## 2021-04-20 MED ORDER — SODIUM CHLORIDE 0.9 % IV SOLN
1.0000 g | INTRAVENOUS | Status: DC
  Administered 2021-04-20: 1 g via INTRAVENOUS
  Filled 2021-04-20: qty 10

## 2021-04-20 MED ORDER — DULOXETINE HCL 30 MG PO CPEP: 30.0000 mg | ORAL_CAPSULE | Freq: Every day | ORAL | 3 refills | Status: DC

## 2021-04-20 NOTE — Plan of Care (Signed)

## 2021-04-20 NOTE — Discharge Summary (Addendum)
Physician Discharge Summary  Catherine Mason SWN:[id number] DOB: July 11, 1927 DOA: 04/10/2021  PCP: Reynold Bowen, MD  Admit date: 04/10/2021 Discharge date: 04/20/2021  Time spent: 32minutes  Recommendations for Outpatient Follow-up:  Ambulatory referral sent to [facility] neurology for follow-up in 4 weeks PCP in 1 week, please check BMP at follow-up, Titrate insulin dose and diuretics Outpatient MBS in 2 weeks to determine, if she can be taken off nectar thick liquids, call [phone] to schedule this   Discharge Diagnoses:  Principal Problem:   Lower extremity weakness   Dysphagia   AKI on CKD3a   HTN (hypertension)   CKD (chronic kidney disease), stage III ([place])   Hypothyroid   Chronic diastolic CHF (congestive heart failure) ([place])   RTA (renal tubular acidosis)   DM2 (diabetes mellitus, type 2) ([place]) Pyuria  Discharge Condition: Stable  Diet recommendation: Dysphagia 3 low-sodium, diabetic diet with nectar thick liquids  Filed Weights   04/18/21 0500 04/19/21 0346 04/20/21 0529  Weight: 68.5 kg 65.6 kg 65.2 kg    History of present illness:  Catherine Mason is an 87 y.o. female past medical history significant for diabetes mellitus type 2 chronic kidney disease stage IIIb creatinine 1.6-1.8, CAD, Uterine cancer, essential hypertension chronic diastolic heart failure was transferred to [facility] from [facility] ED . -She presented with chest bilateral leg weakness progressively worsening over few weeks  Hospital Course:   Subacute lower extremity weakness, pain -Secondary to diabetic polyneuropathy, followed by neurology -MRI brain, cervical thoracic and lumbar spine were unrevealing, degenerative changes noted -CT abdomen pelvis without evidence of malignancy,  -LP was completed 10/11, CSF with increase RBCs, Bloody tap, protein is normal -CK was 36, electrolytes were unremarkable  -Started on low-dose gabapentin however had resultant lethargy worsened by AKI and decreased  clearance, now discontinued, started on low-dose Cymbalta by neurology, also continue capsaicin cream -B12 within normal limits -Continue physical therapy, PT recommended SNF -Slowly improving, mental status is more stable -Discharge to SNF for short-term rehab -Neurology follow-up in 4 weeks  Dysphagia  -Noted this admission, followed by speech therapy, -Recommended dysphagia 3 carb modified diet with nectar thick liquids at discharge, SLP recommended MBS in 2 weeks to attempt to upgrade diet   AKI on chronic kidney disease stage IIIb: Hyperkalemia -baseline on admission 1.6-1.8 -Creatinine trended to 2.2,, improving now down to 1.6 -Lisinopril discontinued, also required [facility] for hyperkalemia -Bladder scan was negative for retention   Diabetes mellitus type II uncontrolled: -CBGs elevated, Levemir used inpatient, Lantus resumed at discharge  Essential  HTN (hypertension) -Continue amlodipine and Toprol   Hypothyroidism -TSH is high, increased Synthroid dose to 75 MCG  Chronic diastolic heart failure: -Clinically appears euvolemic -Did not require diuretics this admission, Lasix dose decreased to 20 mg daily   Possible UTI -increased WBC and bacteria, treated with IV ceftriaxone X 2 days, urine culture was polymicrobial, will discharge on oral Keflex for 2 more days Discharge Exam: Vitals:   04/20/21 0842 04/20/21 1136  BP: (!) 147/59 (!) 146/66  Pulse: 70 70  Resp: 16 16  Temp: 98 F (36.7 C) (!) 97.5 F (36.4 C)  SpO2: 100% 100%    General: Awake alert, oriented to self and place Cardiovascular: S1-S2, regular rate rhythm Respiratory: Decreased breath sounds at the bases otherwise clear Abdomen: Soft, nontender, bowel sounds present Extremities: No edema  skin: no new rashes on exposed skin  Neuro: mild lower leg weakness, decreased pin prick and paresthesias  Discharge Instructions   Discharge  Instructions     Ambulatory referral to Neurology    Complete by: As directed    An appointment is requested in approximately:4 weeks      Allergies as of 04/20/2021       Reactions   Shellfish Allergy Diarrhea   "SCALLOPS"        Medication List     STOP taking these medications    glimepiride 2 MG tablet Commonly known as: AMARYL   lisinopril 5 MG tablet Commonly known as: ZESTRIL   metoprolol succinate 50 MG 24 hr tablet Commonly known as: TOPROL-XL       TAKE these medications    amLODipine 5 MG tablet Commonly known as: NORVASC Take 1 tablet (5 mg total) by mouth daily. NEED OV. What changed: additional instructions   aspirin EC 81 MG tablet Take 81 mg by mouth daily.   BIOTIN PO Take 1 tablet by mouth daily.   cephALEXin 250 MG capsule Commonly known as: KEFLEX Take 1 capsule (250 mg total) by mouth 3 (three) times daily for 2 days.   DULoxetine 30 MG capsule Commonly known as: CYMBALTA Take 1 capsule (30 mg total) by mouth daily. Start taking on: April 21, 2021   furosemide 40 MG tablet Commonly known as: LASIX Take 0.5 tablets (20 mg total) by mouth every other day. What changed:  how much to take when to take this   insulin glargine (2 Unit Dial) 300 UNIT/ML Solostar Pen Commonly known as: TOUJEO MAX Inject 10 Units into the skin daily as needed (Diabetes).   lactobacillus acidophilus Tabs tablet Take 2 tablets by mouth daily.   levothyroxine 75 MCG tablet Commonly known as: SYNTHROID Take 1 tablet (75 mcg total) by mouth daily before breakfast. What changed:  medication strength how much to take   Lumigan 0.01 % Soln Generic drug: bimatoprost Place 1 drop into both eyes daily.   metoprolol tartrate 25 MG tablet Commonly known as: LOPRESSOR Take 1 tablet (25 mg total) by mouth 2 (two) times daily.   NovoFine 32G X 6 MM Misc Generic drug: Insulin Pen Needle Use as directed   ONE TOUCH ULTRA TEST test strip Generic drug: glucose blood 1 strip by Other route daily. Use 1  strip to check glucose once daily   pravastatin 40 MG tablet Commonly known as: PRAVACHOL TAKE 1 TABLET BY MOUTH EVERY DAY IN THE EVENING   senna-docusate 8.6-50 MG tablet Commonly known as: Senokot-S Take 1 tablet by mouth at bedtime.   Vitamin D (Ergocalciferol) 1.25 MG (50000 UNIT) Caps capsule Commonly known as: DRISDOL Take 50,000 Units by mouth every 7 (seven) days.       Allergies  Allergen Reactions   Shellfish Allergy Diarrhea    "SCALLOPS"    Follow-up Information     Reynold Bowen, MD. Schedule an appointment as soon as possible for a visit in 1 week(s).   Specialty: Endocrinology Contact information: [place] [address] [phone]                  The results of significant diagnostics from this hospitalization (including imaging, microbiology, ancillary and laboratory) are listed below for reference.    Significant Diagnostic Studies: MR BRAIN W WO CONTRAST  Result Date: 04/11/2021 CLINICAL DATA:  Acute neurologic deficit EXAM: MRI HEAD WITHOUT AND WITH CONTRAST TECHNIQUE: Multiplanar, multiecho pulse sequences of the brain and surrounding structures were obtained without and with intravenous contrast. CONTRAST:  4mL GADAVIST GADOBUTROL 1 MMOL/ML IV SOLN,  50mL GADAVIST GADOBUTROL 1 MMOL/ML IV SOLN COMPARISON:  None. FINDINGS: Brain: No acute infarct, mass effect or extra-axial collection. No acute or chronic hemorrhage. There is multifocal hyperintense T2-weighted signal within the white matter. Generalized volume loss without a clear lobar predilection. The midline structures are normal. Vascular: Major flow voids are preserved. Skull and upper cervical spine: Normal calvarium and skull base. Visualized upper cervical spine and soft tissues are normal. Sinuses/Orbits:No paranasal sinus fluid levels or advanced mucosal thickening. No mastoid or middle ear effusion. Normal orbits. IMPRESSION: 1. No acute intracranial abnormality. 2.  Findings of chronic microvascular disease and mild volume loss. Electronically Signed   By: Ulyses Jarred M.D.   On: 04/11/2021 00:44   MR CERVICAL SPINE W WO CONTRAST  Result Date: 04/11/2021 CLINICAL DATA:  Myelopathy, acute or progressive. EXAM: MRI CERVICAL SPINE WITHOUT AND WITH CONTRAST TECHNIQUE: Multiplanar and multiecho pulse sequences of the cervical spine, to include the craniocervical junction and cervicothoracic junction, were obtained without and with intravenous contrast. CONTRAST:  44mL GADAVIST GADOBUTROL 1 MMOL/ML IV SOLN COMPARISON:  None FINDINGS: Mildly motion degraded exam. Alignment: Straightening of the expected cervical lordosis. No significant spondylolisthesis. Vertebrae: Vertebral body height is maintained. Multilevel degenerative endplate irregularity. No significant marrow edema or focal suspicious osseous lesion. Cord: No signal abnormality identified within the cervical spinal cord. Posterior Fossa, vertebral arteries, paraspinal tissues: Cerebellar atrophy. Flow voids preserved within the imaged cervical vertebral arteries. Paraspinal soft tissues unremarkable. Bilateral mastoid effusions. Disc levels: Multilevel disc degeneration. Most notably, there is advanced disc degeneration at C4-C5, C5-C6, C6-C7 and T1-T2. C2-C3: Shallow disc bulge with bilateral uncovertebral hypertrophy. No significant spinal canal or foraminal stenosis. C3-C4: Shallow disc bulge with bilateral uncovertebral hypertrophy. Facet arthrosis and ligamentum flavum hypertrophy. Mild relative spinal canal narrowing (without spinal cord mass effect). Bilateral neural foraminal narrowing (mild right, moderate left). C4-C5: Posterior disc osteophyte complex with bilateral disc osteophyte ridge/uncinate hypertrophy. Facet arthrosis and ligamentum flavum hypertrophy. Mild relative spinal canal narrowing (without spinal cord mass effect). Bilateral neural foraminal narrowing (moderate/severe right, mild left). C5-C6:  Posterior disc osteophyte complex with bilateral disc osteophyte ridge/uncinate hypertrophy. Facet arthrosis and ligamentum flavum hypertrophy. Mild/moderate spinal canal stenosis with contact upon the dorsal and ventral spinal cord. Bilateral neural foraminal narrowing (moderate/severe right, mild left). C6-C7: Disc bulge with bilateral disc osteophyte ridge/uncinate hypertrophy. Minimal partial effacement of the ventral thecal sac (without spinal cord mass effect). Mild/moderate right neural foraminal narrowing. C7-T1: Shallow disc bulge. Minimal facet arthrosis. No significant spinal canal or foraminal stenosis. IMPRESSION: Mildly motion degraded exam. Within the limitations of motion degradation, no spinal cord signal abnormality is identified. Cervical spondylosis, as outlined. Multilevel spinal canal stenosis. Most notably, there is multifactorial mild/moderate spinal canal stenosis at C5-C6. Multilevel foraminal stenosis, as detailed and greatest on the left at C3-C4 (moderate), on the right at C4-C5 (moderate/severe), and on the right at C5-C6 (moderate/severe). Disc degeneration is greatest (severe) at C4-C5, C5-C6, C6-C7 and T1-T2. Electronically Signed   By: Kellie Simmering D.O.   On: 04/11/2021 15:43   MR THORACIC SPINE W WO CONTRAST  Result Date: 04/11/2021 CLINICAL DATA:  Progressive myelopathy. EXAM: MRI THORACIC WITHOUT AND WITH CONTRAST TECHNIQUE: Multiplanar and multiecho pulse sequences of the thoracic spine were obtained without and with intravenous contrast. CONTRAST:  28mL GADAVIST GADOBUTROL 1 MMOL/ML IV SOLN COMPARISON:  None. FINDINGS: Alignment:  2 mm retrolisthesis of L1 on L2 and L2 on L3. Vertebrae: No fracture, evidence of discitis, or bone lesion. Cord:  Normal  signal and morphology. Paraspinal and other soft tissues: Negative. Disc levels: Disc spaces: Degenerative disease with disc height loss at T1-2. Disc desiccation throughout the remainder of the thoracic spine. T1-T2: Mild  broad-based disc bulge. No foraminal or central canal stenosis. T2-T3: No disc protrusion, foraminal stenosis or central canal stenosis. T3-T4: No disc protrusion, foraminal stenosis or central canal stenosis. T4-T5: No disc protrusion, foraminal stenosis or central canal stenosis. T5-T6: Minimal broad-based disc bulge. No foraminal or central canal stenosis. T6-T7: No disc protrusion, foraminal stenosis or central canal stenosis. T7-T8: No disc protrusion, foraminal stenosis or central canal stenosis. T8-T9: No disc protrusion, foraminal stenosis or central canal stenosis. T9-T10: No disc protrusion, foraminal stenosis or central canal stenosis. T10-T11: Minimal broad-based disc bulge. Mild bilateral facet arthropathy. No foraminal or central canal stenosis. T11-T12: Minimal broad-based disc bulge. No foraminal or central canal stenosis. T12-L1: Mild broad-based disc bulge. No foraminal or central canal stenosis. L1-2: Mild broad-based disc bulge. Moderate bilateral facet arthropathy. Mild left foraminal stenosis. Mild spinal stenosis. L2-3: Broad-based disc osteophyte complex. Moderate bilateral facet arthropathy. IMPRESSION: 1.  No acute osseous injury of the thoracic spine. 2. No myelopathy of the thoracic spinal cord. 3. No evidence of nerve root impingement of the thoracic spine. Electronically Signed   By: Kathreen Devoid M.D.   On: 04/11/2021 15:47   MR Lumbar Spine W Wo Contrast  Result Date: 04/11/2021 CLINICAL DATA:  Motor neuron disease EXAM: MRI LUMBAR SPINE WITHOUT AND WITH CONTRAST TECHNIQUE: Multiplanar and multiecho pulse sequences of the lumbar spine were obtained without and with intravenous contrast. CONTRAST:  44mL GADAVIST GADOBUTROL 1 MMOL/ML IV SOLN COMPARISON:  None. FINDINGS: Segmentation:  Standard Alignment:  Dextroscoliosis with apex at L3 Vertebrae:  No acute abnormality. Conus medullaris and cauda equina: Conus extends to the L2 level. Conus and cauda equina appear normal. No abnormal  contrast enhancement. Paraspinal and other soft tissues: Fatty atrophy of the paraspinous musculature. Disc levels: T12-L1: Left asymmetric disc bulge.  No stenosis. L1-L2: Left asymmetric disc bulge and mild facet hypertrophy. No spinal canal stenosis. Mild left neural foraminal stenosis. L2-L3: Moderate facet hypertrophy with small disc bulge. No spinal canal stenosis. Mild left neural foraminal stenosis. L3-L4: Small disc bulge with mild facet hypertrophy. No spinal canal stenosis. No neural foraminal stenosis. L4-L5: Small disc bulge with severe right and mild left facet hypertrophy. No spinal canal stenosis. Mild right neural foraminal stenosis. L5-S1: Severe right and mild left facet hypertrophy. Small right asymmetric disc bulge. No spinal canal stenosis. Mild right neural foraminal stenosis. Visualized sacrum: Normal. IMPRESSION: 1. No acute abnormality of the lumbar spine. 2. Dextroscoliosis with apex at L3. 3. Multilevel moderate to severe facet arthrosis, worst at right L4-5 and L5-S1. 4. Mild neural foraminal stenosis at left L1-L2, left L2-L3, right L4-L5 and right L5-S1. Electronically Signed   By: Ulyses Jarred M.D.   On: 04/11/2021 00:51   DG Swallowing Func-Speech Pathology  Result Date: 04/12/2021 Table formatting from the original result was not included. Objective Swallowing Evaluation: Type of Study: MBS-Modified Barium Swallow Study  Patient Details Name: Catherine Mason MRN: [id number] Date of Birth: 10-06-1927 Today's Date: 04/12/2021 Time: SLP Start Time (ACUTE ONLY): 1000 -SLP Stop Time (ACUTE ONLY): 1029 SLP Time Calculation (min) (ACUTE ONLY): 29 min Past Medical History: Past Medical History: Diagnosis Date  Abdominal wall hernia at previous stoma site   Anemia   CAD (coronary artery disease)   PCI 2003 2.75x13, 2.75x28 Zeta stents mid RCA  CKD (  chronic kidney disease), stage III ([facility])   Diabetes mellitus   Diverticular disease   HTN (hypertension)   Hyperlipidemia   Hypothyroid    NSTEMI (non-ST elevated myocardial infarction) ([place]) 7/14  DES LAD  SVT (supraventricular tachycardia) ([facility])   Thyroid disease   Uterine cancer ([place])   Ventral hernia  Past Surgical History: Past Surgical History: Procedure Laterality Date  ABDOMINAL HYSTERECTOMY    COLON SURGERY    colectomy  colostomy reversal.    coronary stents    LEFT HEART CATHETERIZATION WITH CORONARY ANGIOGRAM N/A 01/14/2013  Procedure: LEFT HEART CATHETERIZATION WITH CORONARY ANGIOGRAM;  Surgeon: Sherren Mocha, MD;  Location[facility] CATH LAB;  Service: Cardiovascular;  Laterality: N/A;  PERCUTANEOUS CORONARY STENT INTERVENTION (PCI-S)  01/14/2013  Procedure: PERCUTANEOUS CORONARY STENT INTERVENTION (PCI-S);  Surgeon: Sherren Mocha, MD;  Location: [facility] CATH LAB;  Service: Cardiovascular;; HPI: 87 y.o. female with medical history significant for type 2 diabetes mellitus, stage IIIb chronic kidney disease with baseline creatinine 1.6-1.8, hypertension, chronic diastolic heart failure, who is admitted to [facility] on 04/10/2021 with bilateral lower extremity weakness after presenting to [facility] ED via ED to ED transfer from [place] ED, have presenting to the latter facility complaining of bilateral lower extremity weakness. Imaging revealed no abnormalities in the brain but did reveal degenerative changes to the cervical and thoracic spine. Pt reports history of dysphagia.  Subjective: Pt awake and alert with great granddaughter present for testing. Assessment / Plan / Recommendation CHL IP CLINICAL IMPRESSIONS 04/12/2021 Clinical Impression Pt was seen for an MBS. Therapist suspects a chronic on acute pharyngeal dysphagia with current illness. Pt's spine suspected to have anatomical differences (osteophytes, ? fusion). Pt diagnosed with degenerative cervical and thoracic spine changes.Overall, pt presents with moderate pharyngeal dysphagia c/b penetration/aspiration with small and large sips of thin liquid, penetration with nectar thick, moderate  vallecular residue across consistencies, and mild residue in pyriform sinuses with thin liquid d/t inadequate timing of epiglottal inversion. Penetration also occured after the swallow with thin liquids from spillage from vestibule. Oral phase largely intact across consistencies, however pt reports needing her meat chopped at home. Pharyngeally, thin liquids from cup resulted in penetration and aspiration that did not clear after weak cough/throat clear (trial 1: PAS 4, trial 2: PAS 7). SLP trialed chin tuck technique resulting in a reduction but not elimination of amount penetrated. Large sips of nectar thick liquid penetrated but remained above the level of the vocal folds (PAS 3); pharyngeal phase was largely intact with small sips of nectar thick from straw and solid consistencies. When provided with large pill whole in puree, pill lodged in vallecula and required multiple swallows and initiation of chin tuck to dislodge. SLP provided great granddaughter and pt education re: thickened liquids, aspiration precautions, and potential compensatory strategies. Recommend Dys3/Nectar thick liquid diet d/t pt reports of needing her meat chopped at baseline and safety concerns with thin liquid. SLP will follow acutely for therapy targeting compensatory swallow strategies and management of diet toleration. SLP Visit Diagnosis Dysphagia, pharyngeal phase (R13.13) Attention and concentration deficit following -- Frontal lobe and executive function deficit following -- Impact on safety and function Severe aspiration risk   CHL IP TREATMENT RECOMMENDATION 04/12/2021 Treatment Recommendations Therapy as outlined in treatment plan below   Prognosis 04/12/2021 Prognosis for Safe Diet Advancement Good Barriers to Reach Goals Time post onset Barriers/Prognosis Comment -- CHL IP DIET RECOMMENDATION 04/12/2021 SLP Diet Recommendations Dysphagia 3 (Mech soft) solids;Nectar thick liquid Liquid Administration via Cup;Straw  Medication  Administration Whole meds with puree Compensations Slow rate;Small sips/bites Postural Changes Remain semi-upright after after feeds/meals (Comment);Seated upright at 90 degrees   CHL IP OTHER RECOMMENDATIONS 04/12/2021 Recommended Consults -- Oral Care Recommendations Oral care BID Other Recommendations Order thickener from pharmacy   CHL IP FOLLOW UP RECOMMENDATIONS 04/12/2021 Follow up Recommendations Other (comment)   CHL IP FREQUENCY AND DURATION 04/12/2021 Speech Therapy Frequency (ACUTE ONLY) min 2x/week Treatment Duration 2 weeks      CHL IP ORAL PHASE 04/12/2021 Oral Phase WFL Oral - Pudding Teaspoon -- Oral - Pudding Cup -- Oral - Honey Teaspoon -- Oral - Honey Cup -- Oral - Nectar Teaspoon -- Oral - Nectar Cup -- Oral - Nectar Straw -- Oral - Thin Teaspoon -- Oral - Thin Cup -- Oral - Thin Straw -- Oral - Puree -- Oral - Mech Soft -- Oral - Regular -- Oral - Multi-Consistency -- Oral - Pill -- Oral Phase - Comment --  CHL IP PHARYNGEAL PHASE 04/12/2021 Pharyngeal Phase Impaired Pharyngeal- Pudding Teaspoon -- Pharyngeal -- Pharyngeal- Pudding Cup -- Pharyngeal -- Pharyngeal- Honey Teaspoon -- Pharyngeal -- Pharyngeal- Honey Cup -- Pharyngeal -- Pharyngeal- Nectar Teaspoon -- Pharyngeal -- Pharyngeal- Nectar Cup Reduced epiglottic inversion;Reduced anterior laryngeal mobility;Pharyngeal residue - valleculae Pharyngeal Material does not enter airway Pharyngeal- Nectar Straw Penetration/Aspiration during swallow;Reduced anterior laryngeal mobility;Reduced epiglottic inversion;Pharyngeal residue - valleculae Pharyngeal Material enters airway, remains ABOVE vocal cords then ejected out Pharyngeal- Thin Teaspoon -- Pharyngeal -- Pharyngeal- Thin Cup Penetration/Aspiration during swallow;Pharyngeal residue - valleculae;Reduced epiglottic inversion;Trace aspiration;Compensatory strategies attempted (with notebox);Penetration/Apiration after swallow;Pharyngeal residue - pyriform Pharyngeal Material enters airway,  CONTACTS cords and not ejected out;Material enters airway, passes BELOW cords and not ejected out despite cough attempt by patient Pharyngeal- Thin Straw NT Pharyngeal -- Pharyngeal- Puree Pharyngeal residue - valleculae Pharyngeal Material does not enter airway Pharyngeal- Mechanical Soft -- Pharyngeal -- Pharyngeal- Regular Pharyngeal residue - valleculae Pharyngeal Material does not enter airway Pharyngeal- Multi-consistency -- Pharyngeal -- Pharyngeal- Pill Pharyngeal residue - valleculae Pharyngeal Material does not enter airway Pharyngeal Comment --  CHL IP CERVICAL ESOPHAGEAL PHASE 04/12/2021 Cervical Esophageal Phase WFL Pudding Teaspoon -- Pudding Cup -- Honey Teaspoon -- Honey Cup -- Nectar Teaspoon -- Nectar Cup -- Nectar Straw -- Thin Teaspoon -- Thin Cup -- Thin Straw -- Puree -- Mechanical Soft -- Regular -- Multi-consistency -- Pill -- Cervical Esophageal Comment -- [place] Siren 04/12/2021, 1:04 PM Orbie Pyo Colvin [place].Ed Actor Pager [phone] Office [phone] `              CT CHEST ABDOMEN PELVIS WO CONTRAST  Result Date: 04/12/2021 CLINICAL DATA:  Cranial neuropathy.  Weakness. EXAM: CT CHEST, ABDOMEN AND PELVIS WITHOUT CONTRAST TECHNIQUE: Multidetector CT imaging of the chest, abdomen and pelvis was performed following the standard protocol without IV contrast. COMPARISON:  11/05/2009 FINDINGS: CT CHEST FINDINGS Cardiovascular: Normal heart size. No pericardial effusions. Coronary artery calcifications. Probable coronary stent. Normal caliber thoracic aorta with scattered calcification. Mediastinum/Nodes: Esophagus is decompressed. No significant lymphadenopathy. Thyroid gland is unremarkable. Lungs/Pleura: Slight fibrosis in the lung periphery and base, suggesting early usual interstitial pneumonitis. Mild traction bronchiectasis in the bases. No focal consolidation. No pleural effusions. No pneumothorax. Musculoskeletal: Prominent degenerative changes  throughout the thoracic spine with bridging osteophyte formation. Mild thoracolumbar scoliosis convex towards the right. CT ABDOMEN PELVIS FINDINGS Hepatobiliary: Small low-attenuation lesion in the right lobe of the liver probably represents a cyst. Gallbladder is moderately distended but without wall thickening  or stone. No bile duct dilatation. Pancreas: Pancreas is atrophic. Scattered calcifications throughout the pancreas consistent with sequela of chronic pancreatitis. No acute inflammatory changes. Spleen: Normal in size without focal abnormality. Adrenals/Urinary Tract: No adrenal gland nodules. Bilateral renal parenchymal atrophy. No hydronephrosis or hydroureter. No renal or ureteral stones. Bladder wall is mildly thickened suggesting possible cystitis. Correlate with urinalysis. Stomach/Bowel: Stomach, small bowel, and colon are not abnormally distended. There is a broad-based anterior abdominal wall hernia containing small bowel and transverse colon. No proximal obstruction or wall thickening is appreciated. Prominent stool in the rectosigmoid colon. Appendix is not identified. Vascular/Lymphatic: Aortic atherosclerosis. No enlarged abdominal or pelvic lymph nodes. Reproductive: Status post hysterectomy. No adnexal masses. Other: No free air or free fluid in the abdomen. Musculoskeletal: Degenerative changes in the lumbar spine. Mild thoracolumbar scoliosis convex towards the right. Sclerosis and lucency in the superior surface of the femoral heads suggest possible avascular necrosis. IMPRESSION: 1. Slight fibrosis in the lung periphery and bases. Mild traction bronchiectasis. No focal consolidation. 2. Diffuse pancreatic calcification consistent with chronic pancreatitis. No acute changes. 3. Bilateral renal atrophy. 4. Broad-based anterior abdominal wall hernia containing bowel and transverse colon but without proximal obstruction. 5. Aortic atherosclerosis. Electronically Signed   By: Lucienne Capers  M.D.   On: 04/12/2021 01:50    Microbiology: Recent Results (from the past 240 hour(s))  Resp Panel by RT-PCR (Flu A&B, Covid) Nasopharyngeal Swab     Status: None   Collection Time: 04/10/21  2:54 PM   Specimen: Nasopharyngeal Swab; Nasopharyngeal(NP) swabs in vial transport medium  Result Value Ref Range Status   SARS Coronavirus 2 by RT PCR NEGATIVE NEGATIVE Final    Comment: (NOTE) SARS-CoV-2 target nucleic acids are NOT DETECTED.  The SARS-CoV-2 RNA is generally detectable in upper respiratory specimens during the acute phase of infection. The lowest concentration of SARS-CoV-2 viral copies this assay can detect is 138 copies/mL. A negative result does not preclude SARS-Cov-2 infection and should not be used as the sole basis for treatment or other patient management decisions. A negative result may occur with  improper specimen collection/handling, submission of specimen other than nasopharyngeal swab, presence of viral mutation(s) within the areas targeted by this assay, and inadequate number of viral copies(<138 copies/mL). A negative result must be combined with clinical observations, patient history, and epidemiological information. The expected result is Negative.  Fact Sheet for Patients:  EntrepreneurPulse.com.au  Fact Sheet for Healthcare Providers:  IncredibleEmployment.be  This test is no t yet approved or cleared by the [place] FDA and  has been authorized for detection and/or diagnosis of SARS-CoV-2 by FDA under an Emergency Use Authorization (EUA). This EUA will remain  in effect (meaning this test can be used) for the duration of the COVID-19 declaration under Section 564(b)(1) of the Act, 21 U.S.C.section 360bbb-3(b)(1), unless the authorization is terminated  or revoked sooner.       Influenza A by PCR NEGATIVE NEGATIVE Final   Influenza B by PCR NEGATIVE NEGATIVE Final    Comment: (NOTE) The Xpert Xpress  SARS-CoV-2/FLU/RSV plus assay is intended as an aid in the diagnosis of influenza from Nasopharyngeal swab specimens and should not be used as a sole basis for treatment. Nasal washings and aspirates are unacceptable for Xpert Xpress SARS-CoV-2/FLU/RSV testing.  Fact Sheet for Patients: EntrepreneurPulse.com.au  Fact Sheet for Healthcare Providers: IncredibleEmployment.be  This test is not yet approved or cleared by the [place] FDA and has been authorized for detection and/or diagnosis  of SARS-CoV-2 by FDA under an Emergency Use Authorization (EUA). This EUA will remain in effect (meaning this test can be used) for the duration of the COVID-19 declaration under Section 564(b)(1) of the Act, 21 U.S.C. section 360bbb-3(b)(1), unless the authorization is terminated or revoked.  Performed at [facility], [address], [place], [place] [address]   CSF culture w Stat Gram Stain     Status: None   Collection Time: 04/13/21 10:15 AM   Specimen: CSF; Cerebrospinal Fluid  Result Value Ref Range Status   Specimen Description CSF  Final   Special Requests NONE  Final   Gram Stain   Final    CYTOSPIN SMEAR WBC PRESENT,BOTH PMN AND MONONUCLEAR NO ORGANISMS SEEN    Culture   Final    NO GROWTH 3 DAYS Performed at [facility], [place] [address], [place], [place] [address]    Report Status 04/16/2021 FINAL  Final  Urine Culture     Status: Abnormal   Collection Time: 04/18/21  8:17 PM   Specimen: Urine, Clean Catch  Result Value Ref Range Status   Specimen Description URINE, CLEAN CATCH  Final   Special Requests   Final    NONE Performed at [facility], [place] [address] [place], [place] [address]    Culture MULTIPLE SPECIES PRESENT, SUGGEST RECOLLECTION (A)  Final   Report Status 04/20/2021 FINAL  Final     Labs: Basic Metabolic Panel: Recent Labs  Lab 04/15/21 0353 04/16/21 0918 04/17/21 0428 04/18/21 0415 04/19/21 0317   NA 135 136 137 138 138  K 5.1 5.3* 5.6* 5.3* 5.1  CL 109 110 113* 114* 113*  CO2 19* 19* 18* 20* 18*  GLUCOSE 212* 202* 291* 196* 150*  BUN 42* 52* 53* 49* 46*  CREATININE 1.93* 2.22* 2.10* 1.95* 1.63*  CALCIUM 8.6* 8.5* 8.2* 8.2* 8.5*   Liver Function Tests: No results for input(s): AST, ALT, ALKPHOS, BILITOT, PROT, ALBUMIN in the last 168 hours. No results for input(s): LIPASE, AMYLASE in the last 168 hours. No results for input(s): AMMONIA in the last 168 hours. CBC: Recent Labs  Lab 04/15/21 0353 04/17/21 0428 04/18/21 0415 04/19/21 0317 04/20/21 0359  WBC 6.9 5.0 6.2 5.7 5.1  HGB 11.2* 10.3* 10.1* 10.7* 10.7*  HCT 35.1* 33.0* 32.5* 33.6* 33.7*  MCV 94.4 97.9 96.4 96.0 94.7  PLT 154 170 184 183 207   Cardiac Enzymes: No results for input(s): CKTOTAL, CKMB, CKMBINDEX, TROPONINI in the last 168 hours. BNP: BNP (last 3 results) No results for input(s): BNP in the last 8760 hours.  ProBNP (last 3 results) No results for input(s): PROBNP in the last 8760 hours.  CBG: Recent Labs  Lab 04/19/21 0603 04/19/21 1206 04/19/21 1714 04/19/21 2131 04/20/21 0632  GLUCAP 149* 195* 167* 141* 137*   Signed:  Domenic Polite MD.  [facility] 04/20/2021, 12:22 PM

## 2021-04-20 NOTE — Progress Notes (Signed)
Speech Language Pathology Treatment: Dysphagia  Patient Details Name: Catherine Mason MRN: [id number] DOB: 1927-08-18 Today's Date: 04/20/2021 Time: 1103-1594 SLP Time Calculation (min) (ACUTE ONLY): 30 min  Assessment / Plan / Recommendation Clinical Impression  F/u at bedside to determine potential to advance liquids prior to D/C this afternoon. Granddaughter present- we reviewed MBS from 10/10 and rationale for needing nectar thick liquids.  Pt observed with thin and nectar liquids today. Mild occasional throat-clearing present - potentially she may still have issues with airway protection given structural changes to cervical vertebrae. Pt alert, more interactive and engaging since initial sessions.  Recommend that pt be D/Cd on current diet of dysphagia 3 with nectar-thick liquids; continue SLP intervention at [facility] with ongoing RMT treatment (device packed by granddaughter to prevent it from getting lost); trials of thin liquid at bedside per SLP at [facility]. Recommend return for OP MBS in two weeks. Pt's granddaughter verbalized understanding.   HPI HPI: 87 y.o. female with medical history significant for type 2 diabetes mellitus, stage IIIb chronic kidney disease with baseline creatinine 1.6-1.8, hypertension, chronic diastolic heart failure, who is admitted to [facility] on 04/10/2021 with bilateral lower extremity weakness after presenting to [facility] ED via ED to ED transfer from [facility], have presenting to the latter facility complaining of bilateral lower extremity weakness. Imaging revealed no abnormalities in the brain but did reveal degenerative changes to the cervical and thoracic spine. Pt reports history of dysphagia.      SLP Plan  Discharge SLP treatment due to (comment)      Recommendations for follow up therapy are one component of a multi-disciplinary discharge planning process, led by the attending physician.  Recommendations may be updated based on patient status,  additional functional criteria and insurance authorization.    Recommendations  Diet recommendations: Dysphagia 3 (mechanical soft);Nectar-thick liquid Liquids provided via: Straw Medication Administration: Whole meds with liquid Supervision: Intermittent supervision to cue for compensatory strategies Compensations: Slow rate;Small sips/bites Postural Changes and/or Swallow Maneuvers: Seated upright 90 degrees;Upright 30-60 min after meal                Oral Care Recommendations: Oral care BID Follow up Recommendations:  (SNF rehab; OP MBS x 2 weeks) SLP Visit Diagnosis: Dysphagia, pharyngeal phase (R13.13) Plan: Discharge SLP treatment due to (comment)       GO              Ranie Chinchilla L. Tivis Ringer, [place] Office number [phone] Pager [phone]   Juan Quam Laurice  04/20/2021, 1:25 PM

## 2021-04-20 NOTE — Progress Notes (Signed)
OT Cancellation Note  Patient Details Name: Catherine Mason MRN: [id number] DOB: 1927/11/20   Cancelled Treatment:    Reason Eval/Treat Not Completed: Patient declined, no reason specified (Pt with plans to d/c to SNF today; declined OT session. OT treatment to follow as appropriate if pt length of stay continues.)  Zariana Strub A Jarika Robben 04/20/2021, 2:39 PM

## 2021-04-20 NOTE — Plan of Care (Signed)

## 2021-04-20 NOTE — TOC Transition Note (Addendum)
Transition of Care [facility]) - CM/SW Discharge Note   Patient Details  Name: Catherine Mason MRN: [id number] Date of Birth: [birth year]  Transition of [facility]) CM/SW Contact:  Geralynn Ochs, LCSW Phone Number: 04/20/2021, 2:29 PM   Clinical Narrative:  Nurse to call report to [phone], Room 37.  RN please call patient's grandson when Catherine Mason arrives: Catherine Mason [phone].    Final next level of care: Skilled Nursing Facility Barriers to Discharge: Barriers Resolved   Patient Goals and CMS Choice Patient states their goals for this hospitalization and ongoing recovery are:: patient unable to participate in goal setting, only oriented to self CMS Medicare.gov Compare Post Acute Care list provided to:: Patient Represenative (must comment) Choice offered to / list presented to : Adult Children  Discharge Placement              Patient chooses bed at: [facility] Patient to be transferred to facility by: [place] Name of family member notified: Catherine Mason Patient and family notified of of transfer: 04/20/21  Discharge Plan and Services     Post Acute Care Choice: [facility]                               Social Determinants of Health (SDOH) Interventions     Readmission Risk Interventions No flowsheet data found.

## 2021-04-20 NOTE — Progress Notes (Signed)
SLP Cancellation Note  Patient Details Name: Catherine Mason MRN: [id number] DOB: 1928-05-23   Cancelled treatment:    Pt refused MBS x3, despite encouragement.  She is potentially d/cing today.  Recommend OP MBS in 2-3 weeks.  Catherine Mason L. Tivis Ringer, [place] Office number [phone] Pager [phone]       Catherine Mason 04/20/2021, 9:37 AM

## 2021-05-06 ENCOUNTER — Other Ambulatory Visit (HOSPITAL_COMMUNITY): Payer: Self-pay

## 2021-05-06 DIAGNOSIS — R1310 Dysphagia, unspecified: Secondary | ICD-10-CM

## 2021-05-12 ENCOUNTER — Ambulatory Visit (HOSPITAL_COMMUNITY)
Admission: RE | Admit: 2021-05-12 | Discharge: 2021-05-12 | Disposition: A | Discharge status: Home / Self Care | Payer: Medicare Other | Source: Ambulatory Visit | Attending: Adult Health | Admitting: Adult Health

## 2021-05-12 ENCOUNTER — Ambulatory Visit (HOSPITAL_COMMUNITY)
Admission: RE | Admit: 2021-05-12 | Discharge: 2021-05-12 | Disposition: A | Discharge status: Home / Self Care | Payer: Medicare Other | Source: Ambulatory Visit | Attending: Endocrinology | Admitting: Endocrinology

## 2021-05-12 ENCOUNTER — Other Ambulatory Visit: Payer: Self-pay

## 2021-05-12 DIAGNOSIS — R1312 Dysphagia, oropharyngeal phase: Secondary | ICD-10-CM | POA: Insufficient documentation

## 2021-05-12 DIAGNOSIS — R1310 Dysphagia, unspecified: Secondary | ICD-10-CM | POA: Insufficient documentation

## 2021-05-12 IMAGING — RF DG SWALLOWING FUNCTION
12 of 24 series · 12 of 24 positions shown · non-contrast
Comparison: [DATE]

CLINICAL DATA: Dysphagia. Cough/GE reflux disease/other secondary
diagnosis

EXAM:
MODIFIED BARIUM SWALLOW
TECHNIQUE: Different consistencies of barium were administered orally to the
patient by the Speech Pathologist. Imaging of the pharynx was
performed in the lateral projection. The radiologist was present in
the fluoroscopy room for this study, providing personal supervision.
FLUOROSCOPY TIME:  Fluoroscopy Time:  2 minutes 26 seconds
Radiation Exposure Index (if provided by the fluoroscopic device):
14.26 mGy
Number of Acquired Spot Images: Multiple cine clips.

[Series 2: run · 1 of 28 frames shown (1 of 12)]
[frame 5/28]
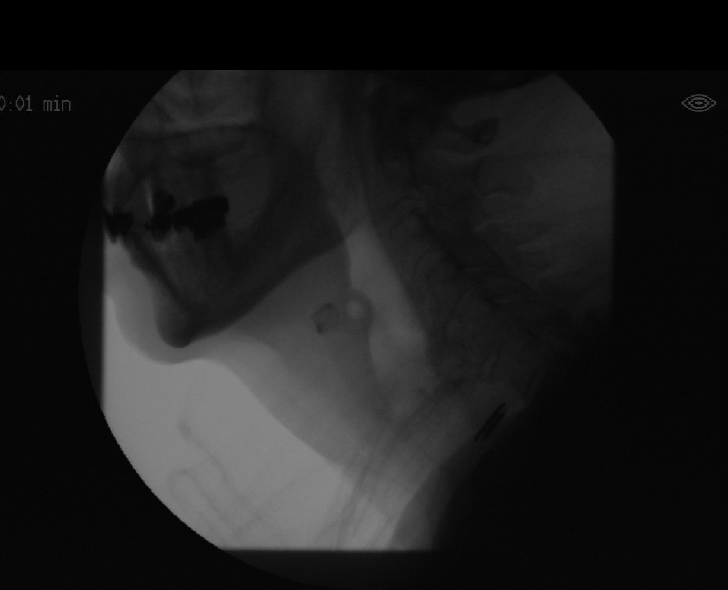

[Series 4: run · 1 of 104 frames shown (2 of 12)]
[frame 16/104]
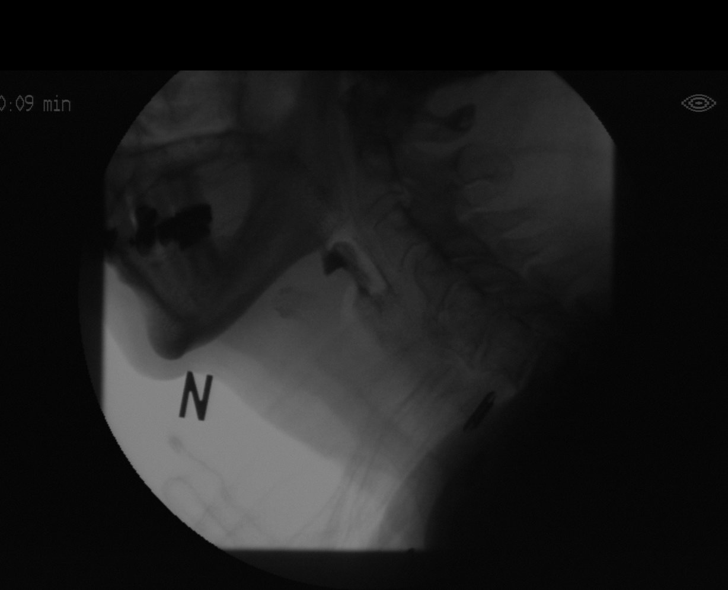

[Series 6: run · 1 of 60 frames shown (3 of 12)]
[frame 31/60]
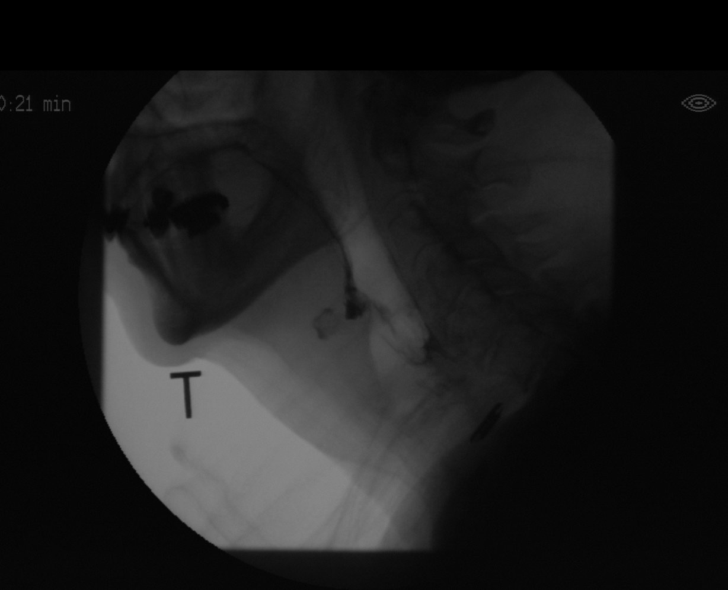

[Series 8: run · 1 of 213 frames shown (4 of 12)]
[frame 32/213]
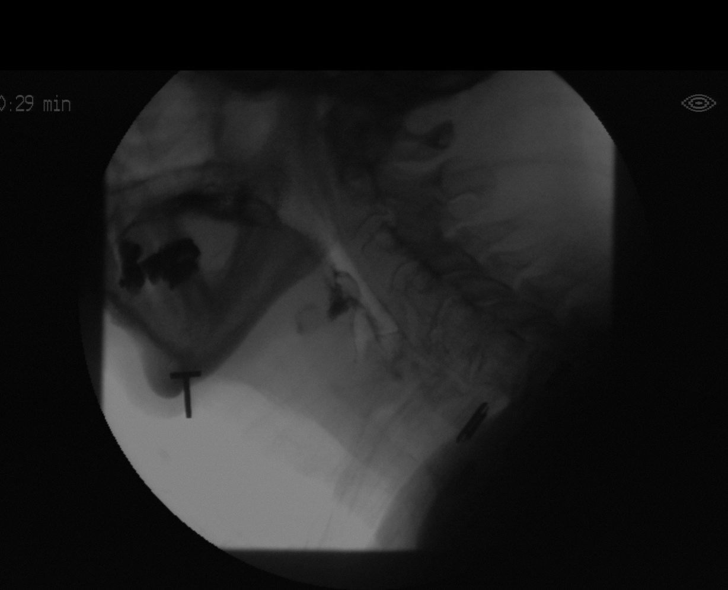

[Series 10: run · 1 of 100 frames shown (5 of 12)]
[frame 51/100]
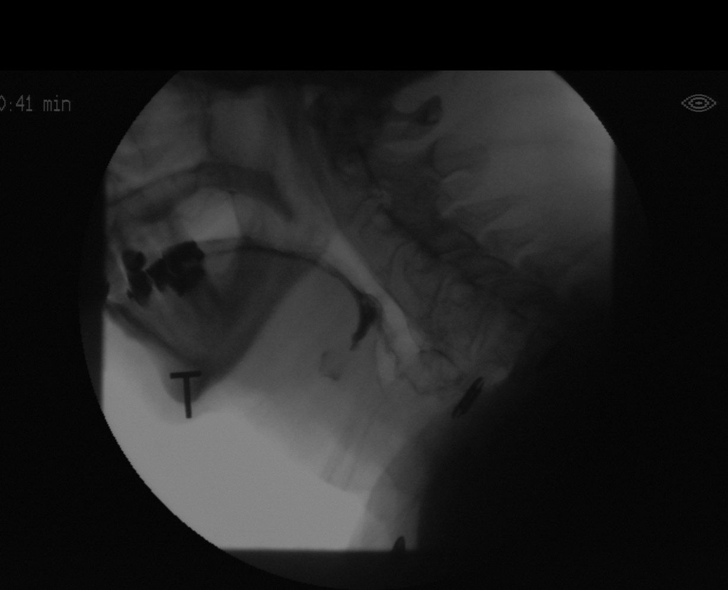

[Series 12: run · 1 of 181 frames shown (6 of 12)]
[frame 91/181]
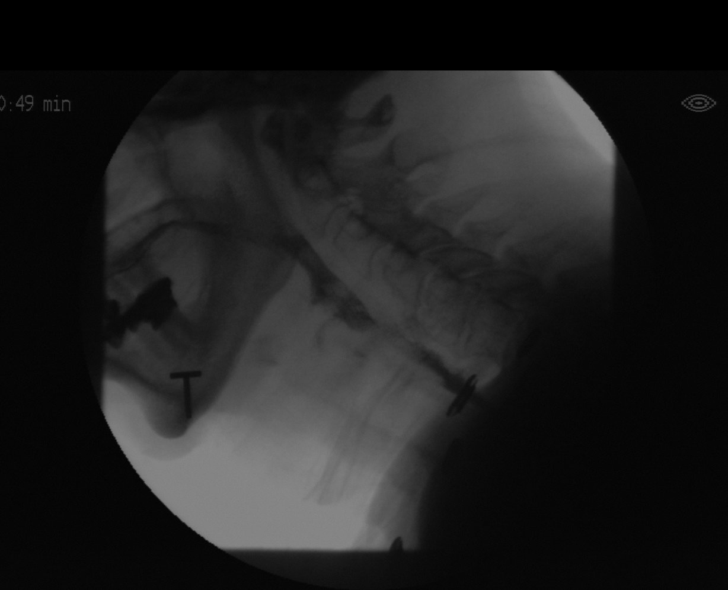

[Series 14: run · 1 of 48 frames shown (7 of 12)]
[frame 40/48]
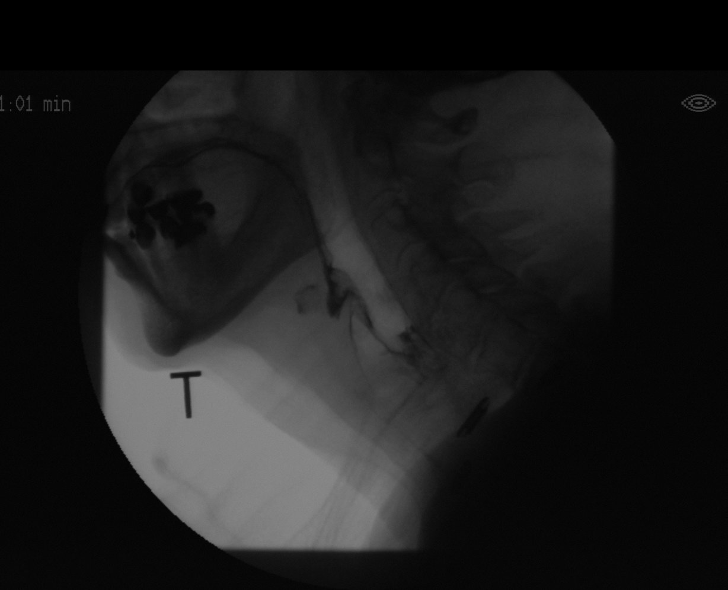

[Series 16: run · 1 of 107 frames shown (8 of 12)]
[frame 54/107]
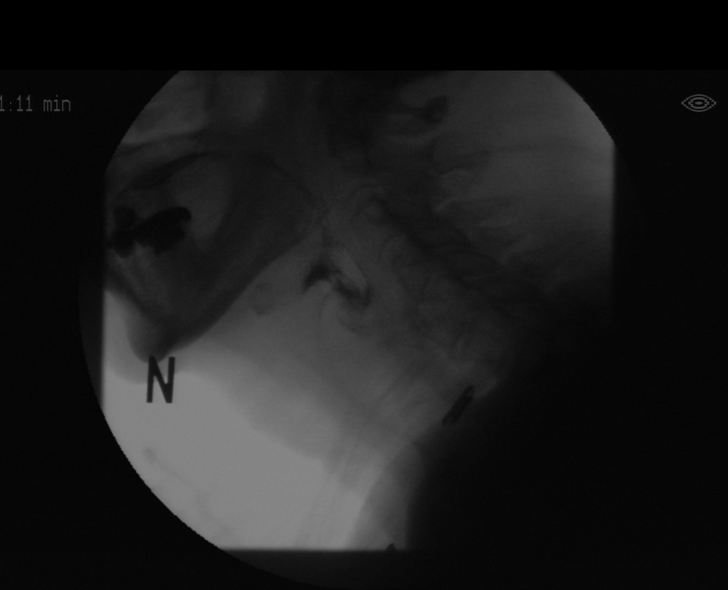

[Series 18: run · 1 of 200 frames shown (9 of 12)]
[frame 101/200]
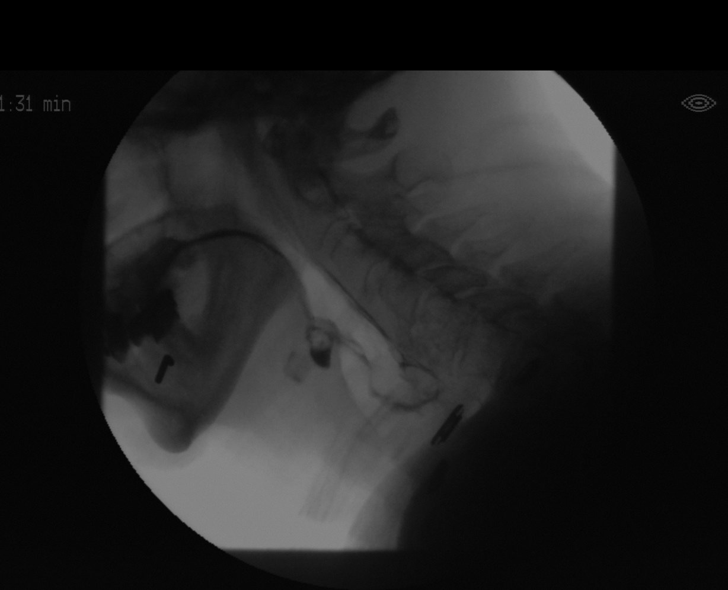

[Series 20: run · 1 of 120 frames shown (10 of 12)]
[frame 61/120]
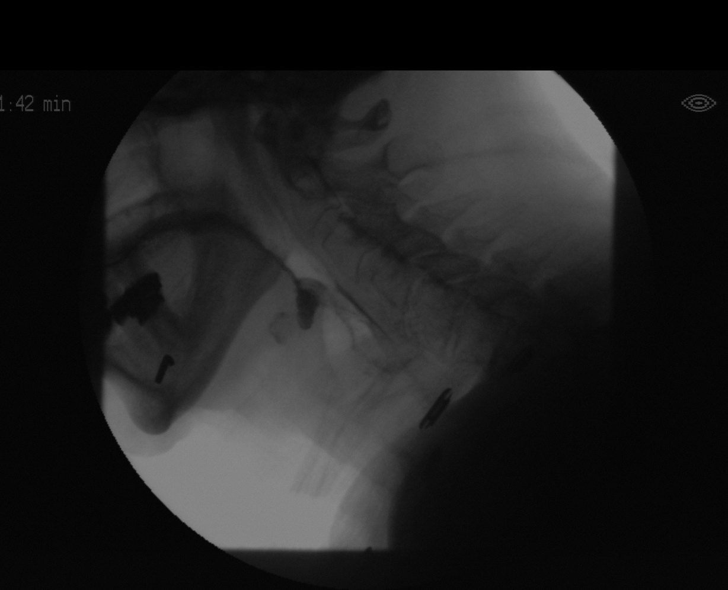

[Series 22: run · 1 of 112 frames shown (11 of 12)]
[frame 96/112]
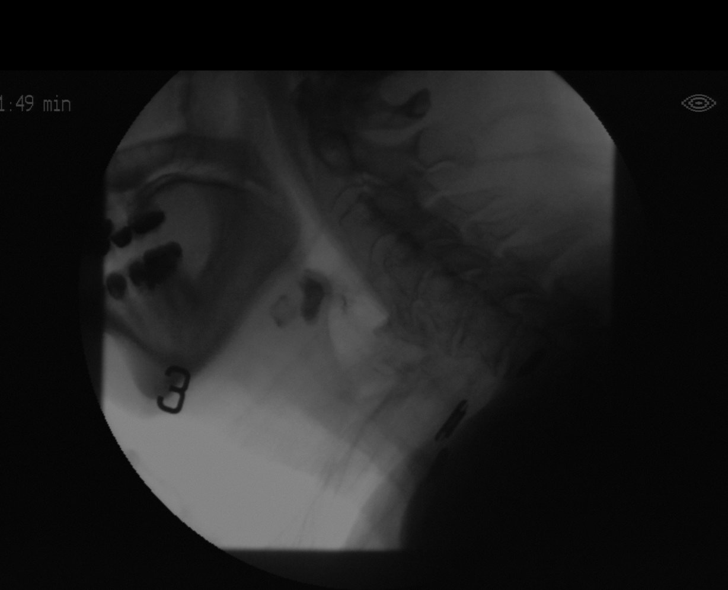

[Series 24: run · 1 of 19 frames shown (12 of 12)]
[frame 19/19]
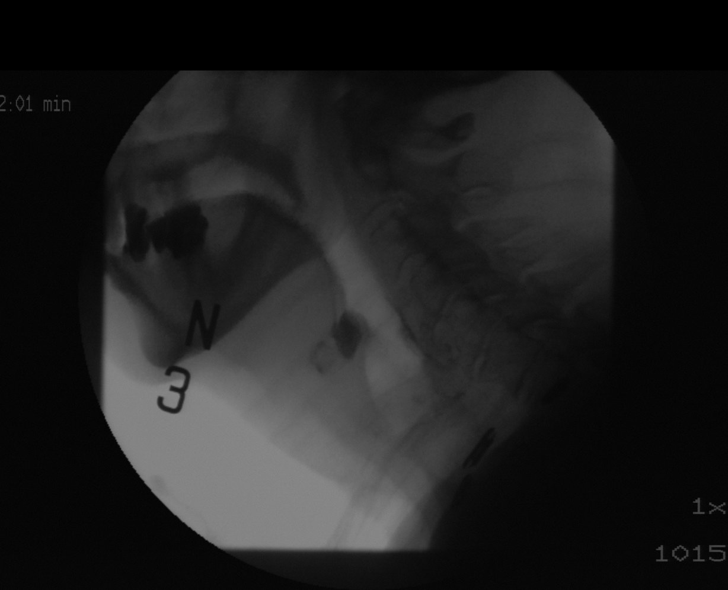

[12 of 24 positions shown; findings below may reference images not displayed]

FINDINGS: There is laryngeal penetration with both thin and nectar thick
liquids. There is intra tracheal aspiration with thin liquids.
Delayed oral transit with puree on a cracker.
IMPRESSION: Intra-tracheal aspiration with thin liquids. Laryngeal penetration
with nectar thick liquids.

Please refer to the Speech Pathologists report for complete details
and recommendations.

## 2021-05-12 NOTE — Therapy (Signed)
Modified Barium Swallow Progress Note  Patient Details  Name: Catherine Mason MRN: [id number] Date of Birth: [birth year]  Today's Date: 05/12/2021  Modified Barium Swallow completed.  Full report located under Chart Review in the Imaging Section.  Brief recommendations include the following:  Clinical Impression Pt seen for follow up MBS on an outpatient basis. Pt's granddaughter was present during this evaluation. Pt was awake and alert. She is quite hard of hearing, and requires increased volume and repetition to facilitate comprehension.  Pt continues to present with pharyngeal dysphagia, and appeared to exhibit mild oral issues this date. Orally, pt exhibited extended oral prep of solid textures, and poor bolus formation with premature posterior spillage of liquids and puree textures. The oral prep of solids raises concern for possible fatigue, which increases risk for inadequate intake and aspiration risk.  Pharyngeal swallow continues to be characterized by swallow reflex trigger at the vallecular sinus, and post-swallow residue in both the vallecular and pyriform sinuses across consistencies. Cued and independent dry swallows reduced but did not eliminate residue, This is likely due to continued mis-timing of epiglottic inversion as well as weak muscular contraction and poor tongue base retraction. Penetration was seen on thin liquids before, during and after the swallow. Penetrate reached the level of the vocal folds, with weak throat clear in response. Use of straw or chin tuck increased amount and depth of penetration. Subepiglottic (SE) penetration was seen on nectar thick liquids and puree during the swallow, however, penetrate remained above the level of the vocal folds.   At this time, recommend continuing with nectar thick liquids via cup or straw, soft chopped solids with extra gravy/sauce for moisture, meds whole in puree, or crushed if large. Oral care 2-3x/day, upright position  during meals and for 30 minutes after meals. Given pt's desire for thin water, recommend consideration of a Water Protocol to increase hydration. Safe swallow precautions and diet recommendations were sent with pt's granddaughter. Will defer to SLP at pt's facility for plan of treatment.     Swallow Evaluation Recommendations  SLP Diet Recommendations: Dysphagia 3 (Mech soft) solids;Dysphagia 2 (Fine chop) solids;Nectar thick liquid   Liquid Administration via: Cup;Straw   Medication Administration: Whole meds with puree   Supervision: Patient able to self feed;Staff to assist with self feeding;Intermittent supervision to cue for compensatory strategies   Compensations: Slow rate;Minimize environmental distractions;Small sips/bites;Multiple dry swallows after each bite/sip   Postural Changes: Remain semi-upright after after feeds/meals (Comment);Seated upright at 90 degrees   Oral Care Recommendations: Oral care BID   Other Recommendations: Order thickener from [place]. Quentin Ore, [facility], [place] Speech Language Pathologist Office: [phone]  Shonna Chock 05/12/2021,1:34 PM

## 2021-05-20 ENCOUNTER — Other Ambulatory Visit: Payer: Self-pay | Admitting: Cardiology

## 2021-05-21 ENCOUNTER — Ambulatory Visit: Payer: Medicare Other | Admitting: Neurology

## 2021-06-30 ENCOUNTER — Encounter: Payer: Self-pay | Admitting: Neurology

## 2021-06-30 ENCOUNTER — Ambulatory Visit (INDEPENDENT_AMBULATORY_CARE_PROVIDER_SITE_OTHER): Payer: Medicare Other | Admitting: Neurology

## 2021-06-30 VITALS — BP 111/68 | HR 71 | Ht 64.0 in | Wt 143.8 lb

## 2021-06-30 DIAGNOSIS — R29898 Other symptoms and signs involving the musculoskeletal system: Secondary | ICD-10-CM

## 2021-06-30 DIAGNOSIS — R5381 Other malaise: Secondary | ICD-10-CM

## 2021-06-30 DIAGNOSIS — E1142 Type 2 diabetes mellitus with diabetic polyneuropathy: Secondary | ICD-10-CM | POA: Diagnosis not present

## 2021-06-30 NOTE — Patient Instructions (Signed)
Continue with physical therapy  Continue all other medications  Follow up with your primary care doctor.

## 2021-06-30 NOTE — Progress Notes (Signed)
[facility]  PATIENT: Catherine Mason DOB: 1927/09/17  REQUESTING CLINICIAN: Domenic Polite, MD HISTORY FROM: Patient and chart review  REASON FOR VISIT: Bilateral lower extremities weakness    HISTORICAL  CHIEF COMPLAINT:  Chief Complaint  Patient presents with   New Patient (Initial Visit)    Rm 13. Accompanied by aide. NP/ED referral for weakness of both legs - neuropathy    HISTORY OF PRESENT ILLNESS:  This is a 87 year old woman with past medical history of hypertension, hyperlipidemia, diabetes mellitus, hypothyroidism, CKD stage III and CAD who is presenting with complaint of bilateral lower extremities weakness.  Patient initially presented to the ED on October 8 for bilateral lower extremities weakness and was admitted for total of 10 days.  During this admission she did have MRI brain, MRI cervical thoracic and lumbar spine which were all unrevealing other than degenerative changes.  She also had an LP with no acute abnormality.  Her lower extremity weakness was felt to be secondary to polyneuropathy from diabetes.  She was started on physical therapy and discharged to nursing home.  Currently she stays at the [facility], she is accompanied by nursing aide who does not know the patient, she just met her today.  History limited and obtained via chart review.  Per patient she still have weakness of the lower extremities.  She continues to do physical therapy but does not recall any improvement.  No other complaint.     OTHER MEDICAL CONDITIONS: HTN, HLD, DM, Hypothyroidism, CKD, CAD.    REVIEW OF SYSTEMS: Full 14 system review of systems performed and negative with exception of: unable to fully obtain as limited history per patient.   ALLERGIES: Allergies  Allergen Reactions   Shellfish Allergy Diarrhea    "SCALLOPS"    HOME MEDICATIONS: Outpatient Medications Prior to Visit  Medication Sig Dispense Refill   amLODipine (NORVASC) 5 MG tablet  Take 1 tablet (5 mg total) by mouth daily. NEED OV. (Patient taking differently: Take 5 mg by mouth daily.) 7 tablet 0   aspirin EC 81 MG tablet Take 81 mg by mouth daily.     B Complex CAPS Take 1 capsule by mouth daily.     Biotin 1000 MCG tablet Take 1 mg by mouth daily.     DULoxetine (CYMBALTA) 60 MG capsule Take 60 mg by mouth daily.     furosemide (LASIX) 40 MG tablet TAKE 1 TABLET BY MOUTH EVERY DAY 90 tablet 0   insulin glargine, 2 Unit Dial, (TOUJEO MAX) 300 UNIT/ML Solostar Pen Inject 10 Units into the skin daily as needed (Diabetes).     lactobacillus acidophilus (BACID) TABS tablet Take 2 tablets by mouth daily.     levothyroxine (SYNTHROID) 100 MCG tablet Take 100 mcg by mouth daily.     levothyroxine (SYNTHROID) 75 MCG tablet Take 1 tablet by mouth daily.     LUMIGAN 0.01 % SOLN Place 1 drop into both eyes daily.     metoprolol tartrate (LOPRESSOR) 25 MG tablet Take 1 tablet (25 mg total) by mouth 2 (two) times daily.  0   mineral oil-hydrophilic petrolatum (AQUAPHOR) ointment Apply 1 application topically daily.     NOVOFINE 32G X 6 MM MISC Use as directed  6   ONE TOUCH ULTRA TEST test strip 1 strip by Other route daily. Use 1 strip to check glucose once daily  11   pravastatin (PRAVACHOL) 40 MG tablet TAKE 1 TABLET BY MOUTH EVERY DAY IN THE EVENING  90 tablet 3   senna-docusate (SENOKOT-S) 8.6-50 MG tablet Take 1 tablet by mouth at bedtime.     Vitamin D, Ergocalciferol, (DRISDOL) 50000 units CAPS capsule Take 50,000 Units by mouth every 7 (seven) days.     BIOTIN PO Take 1 tablet by mouth daily.     DULoxetine (CYMBALTA) 30 MG capsule Take 1 capsule (30 mg total) by mouth daily.  3   levothyroxine (SYNTHROID) 75 MCG tablet Take 1 tablet (75 mcg total) by mouth daily before breakfast.     No facility-administered medications prior to visit.    PAST MEDICAL HISTORY: Past Medical History:  Diagnosis Date   Abdominal wall hernia at previous stoma site    Anemia    CAD  (coronary artery disease)    PCI 2003 2.75x13, 2.75x28 Zeta stents mid RCA   CKD (chronic kidney disease), stage III (HCC)    Diabetes mellitus    Diverticular disease    HTN (hypertension)    Hyperlipidemia    Hypothyroid    NSTEMI (non-ST elevated myocardial infarction) ([place]) 7/14   DES LAD   SVT (supraventricular tachycardia) (HCC)    Thyroid disease    Uterine cancer ([place])    Ventral hernia     PAST SURGICAL HISTORY: Past Surgical History:  Procedure Laterality Date   ABDOMINAL HYSTERECTOMY     COLON SURGERY     colectomy   colostomy reversal.     coronary stents     LEFT HEART CATHETERIZATION WITH CORONARY ANGIOGRAM N/A 01/14/2013   Procedure: LEFT HEART CATHETERIZATION WITH CORONARY ANGIOGRAM;  Surgeon: Sherren Mocha, MD;  Location: [facility] CATH LAB;  Service: Cardiovascular;  Laterality: N/A;   PERCUTANEOUS CORONARY STENT INTERVENTION (PCI-S)  01/14/2013   Procedure: PERCUTANEOUS CORONARY STENT INTERVENTION (PCI-S);  Surgeon: Sherren Mocha, MD;  Location: [facility] CATH LAB;  Service: Cardiovascular;;    FAMILY HISTORY: Family History  Problem Relation Age of Onset   Heart disease Mother        heart attack   Cancer Father        prostate   Diabetes Father     SOCIAL HISTORY: Social History   Socioeconomic History   Marital status: Widowed    Spouse name: Not on file   Number of children: Not on file   Years of education: Not on file   Highest education level: Not on file  Occupational History   Not on file  Tobacco Use   Smoking status: Never   Smokeless tobacco: Never  Vaping Use   Vaping Use: Never used  Substance and Sexual Activity   Alcohol use: No   Drug use: No   Sexual activity: Not on file  Other Topics Concern   Not on file  Social History Narrative   Not on file   Social Determinants of Health   Financial Resource Strain: Not on file  Food Insecurity: Not on file  Transportation Needs: Not on file  Physical Activity: Not on file  Stress:  Not on file  Social Connections: Not on file  Intimate Partner Violence: Not on file    PHYSICAL EXAM  GENERAL EXAM/CONSTITUTIONAL: Vitals:  Vitals:   06/30/21 1304  BP: 111/68  Pulse: 71  Weight: 143 lb 12.8 oz (65.2 kg)  Height: _0  (1.626 m)   Body mass index is 24.68 kg/m. Wt Readings from Last 3 Encounters:  06/30/21 143 lb 12.8 oz (65.2 kg)  04/20/21 143 lb 11.8 oz (65.2 kg)  09/12/19 147 lb (66.7 kg)  Patient is in no distress; well developed, nourished and groomed; neck is supple  CARDIOVASCULAR: Examination of carotid arteries is normal; no carotid bruits Regular rate and rhythm, no murmurs Examination of peripheral vascular system by observation and palpation is normal  EYES: Pupils round and reactive to light, Visual fields full to confrontation, Extraocular movements intacts,   MUSCULOSKELETAL: Gait, strength, tone, movements noted in Neurologic exam below  NEUROLOGIC: MENTAL STATUS:  No flowsheet data found. awake, alert, oriented to person, place and time recent and remote memory intact normal attention and concentration language fluent, comprehension intact, naming intact fund of knowledge appropriate  CRANIAL NERVE:  2nd, 3rd, 4th, 6th - pupils equal and reactive to light, visual fields full to confrontation, extraocular muscles intact, no nystagmus 5th - facial sensation symmetric 7th - facial strength symmetric 8th - hearing intact 9th - palate elevates symmetrically, uvula midline 11th - shoulder shrug symmetric 12th - tongue protrusion midline  MOTOR:  Decrease bulk. BUE is full. Right hip flexion 3/5, knee extension 4/5 and dorsiflexion 4/5. Left hip flexion 2/5, knee extension and plantar dorsiflexion 3/5.   SENSORY:  normal and symmetric to light touch,  REFLEXES:  deep tendon reflexes present and symmetric  GAIT/STATION:  Deferred     DIAGNOSTIC DATA (LABS, IMAGING, TESTING) - I reviewed patient records, labs, notes,  testing and imaging myself where available.  Lab Results  Component Value Date   WBC 5.1 04/20/2021   HGB 10.7 (L) 04/20/2021   HCT 33.7 (L) 04/20/2021   MCV 94.7 04/20/2021   PLT 207 04/20/2021      Component Value Date/Time   NA 138 04/19/2021 0317   K 5.1 04/19/2021 0317   CL 113 (H) 04/19/2021 0317   CO2 18 (L) 04/19/2021 0317   GLUCOSE 150 (H) 04/19/2021 0317   BUN 46 (H) 04/19/2021 0317   CREATININE 1.63 (H) 04/19/2021 0317   CREATININE 1.43 (H) 02/03/2015 1410   CALCIUM 8.5 (L) 04/19/2021 0317   CALCIUM 8.6 07/30/2010 1419   PROT 6.2 (L) 04/11/2021 0507   ALBUMIN 2.9 (L) 04/11/2021 0507   AST 18 04/11/2021 0507   ALT 17 04/11/2021 0507   ALKPHOS 128 (H) 04/11/2021 0507   BILITOT 0.8 04/11/2021 0507   GFRNONAA 30 (L) 04/19/2021 0317   GFRAA 30 (L) 01/15/2013 0530   Lab Results  Component Value Date   CHOL 143 01/13/2013   HDL 44 01/13/2013   LDLCALC 60 01/13/2013   TRIG 194 (H) 01/13/2013   CHOLHDL 3.3 01/13/2013   Lab Results  Component Value Date   HGBA1C 9.0 (H) 04/11/2021   Lab Results  Component Value Date   VITAMINB12 333 04/15/2021   Lab Results  Component Value Date   TSH 25.709 (H) 04/14/2021    MRI Brain  1. No acute intracranial abnormality. 2. Findings of chronic microvascular disease and mild volume loss  MRI Cervical spine  Mildly motion degraded exam. Within the limitations of motion degradation, no spinal cord signal abnormality is identified. Cervical spondylosis, as outlined. Multilevel spinal canal stenosis. Most notably, there is multifactorial mild/moderate spinal canal stenosis at C5-C6. Multilevel foraminal stenosis, as detailed and greatest on the left at C3-C4 (moderate), on the right at C4-C5 (moderate/severe), and on the right at C5-C6 (moderate/severe). Disc degeneration is greatest (severe) at C4-C5, C5-C6, C6-C7 and T1-T2.  MRI Thoracic spine  1.  No acute osseous injury of the thoracic spine. 2. No myelopathy of the  thoracic spinal cord. 3. No evidence of nerve  root impingement of the thoracic spine.  MRI Lumbar spine  1. No acute abnormality of the lumbar spine. 2. Dextroscoliosis with apex at L3. 3. Multilevel moderate to severe facet arthrosis, worst at right L4-5 and L5-S1. 4. Mild neural foraminal stenosis at left L1-L2, left L2-L3, right L4-L5 and right L5-S1    ASSESSMENT AND PLAN  87 y.o. year old female with hypertension, hyperlipidemia, diabetes mellitus, hypothyroidism, CKD stage III and CAD who is presenting with complaint of bilateral lower extremities weakness.  Her work-up including MRI brain and total spine, lumbar puncture so far have been unrevealing.  Patient lower extremities weakness is likely secondary to polyneuropathy and also to physical deconditioning.  She is 87 years old and appear deconditioned on exam.  She reports continuing with physical therapy at the nursing home and I encouraged her to continue with physical therapy.  Continue all your other medications, follow with your primary care doctor and return if worse.   1. Diabetic polyneuropathy associated with type 2 diabetes mellitus ([place])   2. Physical deconditioning   3. Weakness of both lower extremities     Patient Instructions  Continue with physical therapy  Continue all other medications  Follow up with your primary care doctor.  No orders of the defined types were placed in this encounter.   No orders of the defined types were placed in this encounter.   Return if symptoms worsen or fail to improve.    Alric Ran, MD 06/30/2021, 5:45 PM  [facility] [address], [place], [place] [address] [phone]

## 2021-08-08 NOTE — Progress Notes (Deleted)
Virtual Visit via Telephone Note   This visit type was conducted due to national recommendations for restrictions regarding the COVID-19 Pandemic (e.g. social distancing) in an effort to limit this patient's exposure and mitigate transmission in our community.  Due to her co-morbid illnesses, this patient is at least at moderate risk for complications without adequate follow up.  This format is felt to be most appropriate for this patient at this time.  The patient did not have access to video technology/had technical difficulties with video requiring transitioning to audio format only (telephone).  All issues noted in this document were discussed and addressed.  No physical exam could be performed with this format.  Please refer to the patient's chart for her  consent to telehealth for [facility].   The patient was identified using 2 identifiers.  Date:  08/08/2021   ID:  Catherine Mason, DOB [birth year], MRN [id number]  Patient Location: Home Provider Location: Home  PCP:  Reynold Bowen, MD  Cardiologist:  Judith Campillo [place], MD  Electrophysiologist:  None   Evaluation Performed:  Follow-Up Visit  Chief Complaint:  CAD  History of Present Illness:    Catherine Mason is a 88 y.o. female with history of coronary disease and is status post stenting of the right coronary in 2003 with overlapping bare-metal stents. This includes a 2.75 x 28 and 2.75 x 13 mm Zeta stents. In July 2014 she was admitted with a non-ST elevation myocardial infarction. Cardiac catheterization demonstrated 99% stenosis in the proximal LAD. The left circumflex and right coronaries were patent. Ejection fraction by echocardiogram was 35-40% with septal and apical akinesis. She underwent successful stenting of the proximal LAD with a drug-eluting stent on 01/14/2013.  Follow up Echo in November 2014 and in July 2016 showed normal LV function.   She was admitted in October 2022 with progressive leg weakness. Extensive  evaluation. Felt to be related to neuropathy.  On follow up today she is doing OK. She is nearing 88 yo. She walks with a walker. No longer drives. Denies any chest pain, dyspnea or edema. Had one episode where she awoke at 4 am with her heart racing. States her sugar was low. Only lasted 5 minutes.   The patient does not have symptoms concerning for COVID-19 infection (fever, chills, cough, or new shortness of breath).    Past Medical History:  Diagnosis Date   Abdominal wall hernia at previous stoma site    Anemia    CAD (coronary artery disease)    PCI 2003 2.75x13, 2.75x28 Zeta stents mid RCA   CKD (chronic kidney disease), stage III (HCC)    Diabetes mellitus    Diverticular disease    HTN (hypertension)    Hyperlipidemia    Hypothyroid    NSTEMI (non-ST elevated myocardial infarction) ([place]) 7/14   DES LAD   SVT (supraventricular tachycardia) (HCC)    Thyroid disease    Uterine cancer ([place])    Ventral hernia    Past Surgical History:  Procedure Laterality Date   ABDOMINAL HYSTERECTOMY     COLON SURGERY     colectomy   colostomy reversal.     coronary stents     LEFT HEART CATHETERIZATION WITH CORONARY ANGIOGRAM N/A 01/14/2013   Procedure: LEFT HEART CATHETERIZATION WITH CORONARY ANGIOGRAM;  Surgeon: Sherren Mocha, MD;  Location: [facility] CATH LAB;  Service: Cardiovascular;  Laterality: N/A;   PERCUTANEOUS CORONARY STENT INTERVENTION (PCI-S)  01/14/2013   Procedure: PERCUTANEOUS CORONARY STENT INTERVENTION (PCI-S);  Surgeon: Sherren Mocha, MD;  Location: [facility] CATH LAB;  Service: Cardiovascular;;     No outpatient medications have been marked as taking for the 08/12/21 encounter (Appointment) with Martinique, Blanka Rockholt M, MD.     Allergies:   Shellfish allergy   Social History   Tobacco Use   Smoking status: Never   Smokeless tobacco: Never  Vaping Use   Vaping Use: Never used  Substance Use Topics   Alcohol use: No   Drug use: No     Family Hx: The patient's family history  includes Cancer in her father; Diabetes in her father; Heart disease in her mother.  ROS:   Please see the history of present illness.    All other systems reviewed and are negative.   Prior CV studies:   The following studies were reviewed today:  Echo: 01/19/15:Study Conclusions  - Left ventricle: The cavity size was normal. Systolic function was   normal. The estimated ejection fraction was in the range of 55%   to 60%. Wall motion was normal; there were no regional wall   motion abnormalities. There was an increased relative   contribution of atrial contraction to ventricular filling.   Doppler parameters are consistent with abnormal left ventricular   relaxation (grade 1 diastolic dysfunction). - Aortic valve: Poorly visualized. Trileaflet; mildly thickened,   mildly calcified leaflets. - Mitral valve: Mild focal calcification of the anterior leaflet   (medial segment(s)). - Pulmonary arteries: PA peak pressure: 32 mm Hg (S).  Labs/Other Tests and Data Reviewed:    EKG:  No ECG reviewed.  Recent Labs: 04/11/2021: ALT 17; Magnesium 2.1 04/14/2021: TSH 25.709 04/19/2021: BUN 46; Creatinine, Ser 1.63; Potassium 5.1; Sodium 138 04/20/2021: Hemoglobin 10.7; Platelets 207   Recent Lipid Panel Lab Results  Component Value Date/Time   CHOL 143 01/13/2013 04:30 AM   TRIG 194 (H) 01/13/2013 04:30 AM   HDL 44 01/13/2013 04:30 AM   CHOLHDL 3.3 01/13/2013 04:30 AM   LDLCALC 60 01/13/2013 04:30 AM   Labs dated 12/14/15: cholesterol 142, triglycerides 129, LDL 60, HDL 56. BUN 25, creatinine 1.6. CMET otherwise normal. TSH normal Dated 04/08/16: glucose 312. A1c 7.6%. Dated 12/28/16: cholesterol 143, triglycerides 131, HDL 57, LDL 60. A1c 7.1%. BUN 40, creatinine 1.7. Glucose 205. Otherwise CMET and TSH normal.  Dated 05/02/17: A1c 7.5% Dated 01/23/18: cholesterol 144, triglycerides 106, HDL 59, LDL 64. Hgb 12.9. creatinine 1.7. TSH and ALT normal Dated 06/08/18: A1c 8.2% Dated  5.26.20: cholesterol 143, triglycerides 119, HDL 58, LDL 61. A1c 7.4%. creatinine 1.8. otherwise CMET, CBC, TSH normal.   Wt Readings from Last 3 Encounters:  06/30/21 143 lb 12.8 oz (65.2 kg)  04/20/21 143 lb 11.8 oz (65.2 kg)  09/12/19 147 lb (66.7 kg)     Objective:    Vital Signs:  There were no vitals taken for this visit.   VITAL SIGNS:  reviewed  ASSESSMENT & PLAN:    1. Coronary disease with  NSTEMI in July of 2014 status post DES of the proximal LAD. Prior stents in the right coronary from 2003 were patent. Continue ASA.  She is asymptomatic.    2. Ischemic cardiomyopathy- EF  returned to normal post revascularization with stenting. Chronic diastolic CHF.  Doing well on lasix 40 mg daily. weight is stable. Continue sodium restriction.   3. Dyslipidemia.  Tolerating pravastatin 40 mg daily. Lipid levels looked excellent in May   4. Diabetes mellitus type 2. per Dr. Forde Dandy.    5. Hypothyroidism.  Normal TSH   6. HTN. controlled  COVID-19 Education: The signs and symptoms of COVID-19 were discussed with the patient and how to seek care for testing (follow up with PCP or arrange E-visit).  The importance of social distancing was discussed today.  Time:   Today, I have spent 10 minutes with the patient with telehealth technology discussing the above problems.     Medication Adjustments/Labs and Tests Ordered: Current medicines are reviewed at length with the patient today.  Concerns regarding medicines are outlined above.   Tests Ordered: No orders of the defined types were placed in this encounter.   Medication Changes: No orders of the defined types were placed in this encounter.   Follow Up:  In Person in 1 year(s)  Signed, Eupha Lobb [place], MD  08/08/2021 9:43 AM    [place]

## 2021-08-12 ENCOUNTER — Other Ambulatory Visit: Payer: Self-pay

## 2021-08-12 ENCOUNTER — Telehealth: Payer: Medicare Other | Admitting: Cardiology

## 2022-04-03 DEATH — deceased

## 2022-05-04 DEATH — deceased
